# Patient Record
Sex: Male | Born: 1965 | Race: White | Hispanic: No | Marital: Single | Smoking: Never smoker
Health system: Southern US, Community
[De-identification: ages and names within clinical notes are randomized; demographics above are authoritative.]

## PROBLEM LIST (undated history)

## (undated) DIAGNOSIS — Z789 Other specified health status: Secondary | ICD-10-CM

## (undated) DIAGNOSIS — F1010 Alcohol abuse, uncomplicated: Secondary | ICD-10-CM

## (undated) HISTORY — PX: COSMETIC SURGERY: SHX468

---

## 2013-03-07 ENCOUNTER — Ambulatory Visit: Payer: Self-pay | Admitting: Family Medicine

## 2013-03-07 VITALS — BP 125/78 | HR 77 | Temp 97.9°F | Resp 18 | Ht 69.0 in | Wt 173.0 lb

## 2013-03-07 DIAGNOSIS — L259 Unspecified contact dermatitis, unspecified cause: Secondary | ICD-10-CM

## 2013-03-07 MED ORDER — TRIAMCINOLONE ACETONIDE 0.1 % EX CREA: TOPICAL_CREAM | Freq: Two times a day (BID) | CUTANEOUS | Status: DC

## 2013-03-07 MED ORDER — PREDNISONE 20 MG PO TABS: ORAL_TABLET | ORAL | Status: DC

## 2013-03-07 NOTE — Patient Instructions (Signed)
Poison [employer] ivy is a inflammation of the skin (contact dermatitis) caused by touching the allergens on the leaves of the ivy plant following previous exposure to the plant. The rash usually appears 48 hours after exposure. The rash is usually bumps (papules) or blisters (vesicles) in a linear pattern. Depending on your own sensitivity, the rash may simply cause redness and itching, or it may also progress to blisters which may break open. These must be well cared for to prevent secondary bacterial (germ) infection, followed by scarring. Keep any open areas dry, clean, dressed, and covered with an antibacterial ointment if needed. The eyes may also get puffy. The puffiness is worst in the morning and gets better as the day progresses. This dermatitis usually heals without scarring, within 2 to 3 weeks without treatment. HOME CARE INSTRUCTIONS  Thoroughly wash with soap and water as soon as you have been exposed to poison ivy. You have about one half hour to remove the plant resin before it will cause the rash. This washing will destroy the oil or antigen on the skin that is causing, or will cause, the rash. Be sure to wash under your fingernails as any plant resin there will continue to spread the rash. Do not rub skin vigorously when washing affected area. Poison ivy cannot spread if no oil from the plant remains on your body. A rash that has progressed to weeping sores will not spread the rash unless you have not washed thoroughly. It is also important to wash any clothes you have been wearing as these may carry active allergens. The rash will return if you wear the unwashed clothing, even several days later. Avoidance of the plant in the future is the best measure. Poison ivy plant can be recognized by the number of leaves. Generally, poison ivy has three leaves with flowering branches on a single stem. Diphenhydramine may be purchased over the counter and used as needed for itching. Do not drive with  this medication if it makes you drowsy.Ask your caregiver about medication for children. SEEK MEDICAL CARE IF:  Open sores develop.  Redness spreads beyond area of rash.  You notice purulent (pus-like) discharge.  You have increased pain.  Other signs of infection develop (such as fever). Document Released: 08/31/2000 Document Revised: 11/26/2011 Document Reviewed: 07/20/2009 [facility] Patient Information 2014 [place], [place].      Shingles Shingles (herpes zoster) is an infection that is caused by the same virus that causes chickenpox (varicella). The infection causes a painful skin rash and fluid-filled blisters, which eventually break open, crust over, and heal. It may occur in any area of the body, but it usually affects only one side of the body or face. The pain of shingles usually lasts about 1 month. However, some people with shingles may develop long-term (chronic) pain in the affected area of the body. Shingles often occurs many years after the person had chickenpox. It is more common:  In people older than 50 years.  In people with weakened immune systems, such as those with HIV, AIDS, or cancer.  In people taking medicines that weaken the immune system, such as transplant medicines.  In people under great stress. CAUSES  Shingles is caused by the varicella zoster virus (VZV), which also causes chickenpox. After a person is infected with the virus, it can remain in the person's body for years in an inactive state (dormant). To cause shingles, the virus reactivates and breaks out as an infection in a nerve root. The  virus can be spread from person to person (contagious) through contact with open blisters of the shingles rash. It will only spread to people who have not had chickenpox. When these people are exposed to the virus, they may develop chickenpox. They will not develop shingles. Once the blisters scab over, the person is no longer contagious and cannot spread the  virus to others. SYMPTOMS  Shingles shows up in stages. The initial symptoms may be pain, itching, and tingling in an area of the skin. This pain is usually described as burning, stabbing, or throbbing.In a few days or weeks, a painful red rash will appear in the area where the pain, itching, and tingling were felt. The rash is usually on one side of the body in a band or belt-like pattern. Then, the rash usually turns into fluid-filled blisters. They will scab over and dry up in approximately 2 3 weeks. Flu-like symptoms may also occur with the initial symptoms, the rash, or the blisters. These may include:  Fever.  Chills.  Headache.  Upset stomach. DIAGNOSIS  Your caregiver will perform a skin exam to diagnose shingles. Skin scrapings or fluid samples may also be taken from the blisters. This sample will be examined under a microscope or sent to a lab for further testing. TREATMENT  There is no specific cure for shingles. Your caregiver will likely prescribe medicines to help you manage the pain, recover faster, and avoid long-term problems. This may include antiviral drugs, anti-inflammatory drugs, and pain medicines. HOME CARE INSTRUCTIONS   Take a cool bath or apply cool compresses to the area of the rash or blisters as directed. This may help with the pain and itching.   Only take over-the-counter or prescription medicines as directed by your caregiver.   Rest as directed by your caregiver.  Keep your rash and blisters clean with mild soap and cool water or as directed by your caregiver.  Do not pick your blisters or scratch your rash. Apply an anti-itch cream or numbing creams to the affected area as directed by your caregiver.  Keep your shingles rash covered with a loose bandage (dressing).  Avoid skin contact with:  Babies.   Pregnant women.   Children with eczema.   Elderly people with transplants.   People with chronic illnesses, such as leukemia or AIDS.    Wear loose-fitting clothing to help ease the pain of material rubbing against the rash.  Keep all follow-up appointments with your caregiver.If the area involved is on your face, you may receive a referral for follow-up to a specialist, such as an eye doctor (ophthalmologist) or an ear, nose, and throat (ENT) doctor. Keeping all follow-up appointments will help you avoid eye complications, chronic pain, or disability.  SEEK IMMEDIATE MEDICAL CARE IF:   You have facial pain, pain around the eye area, or loss of feeling on one side of your face.  You have ear pain or ringing in your ear.  You have loss of taste.  Your pain is not relieved with prescribed medicines.   Your redness or swelling spreads.   You have more pain and swelling.  Your condition is worsening or has changed.   You have a feveror persistent symptoms for more than 2 3 days.  You have a fever and your symptoms suddenly get worse. MAKE SURE YOU:  Understand these instructions.  Will watch your condition.  Will get help right away if you are not doing well or get worse. Document Released: 09/03/2005 Document  Revised: 05/28/2012 Document Reviewed: 04/17/2012 [facility] Patient Information 2014 [place], [place].   Take prednisone 3 daily for 3 days, then 2 daily for 3 days, then 1 daily for 3 days   Use cream twice daily on rash
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
## 2019-10-05 ENCOUNTER — Other Ambulatory Visit: Payer: Self-pay

## 2021-03-25 ENCOUNTER — Emergency Department (HOSPITAL_COMMUNITY): Admission: EM | Admit: 2021-03-25 | Payer: Self-pay | Source: Home / Self Care

## 2021-03-25 ENCOUNTER — Other Ambulatory Visit: Payer: Self-pay

## 2021-03-25 ENCOUNTER — Emergency Department (HOSPITAL_COMMUNITY): Payer: Self-pay

## 2021-03-25 ENCOUNTER — Encounter (HOSPITAL_COMMUNITY): Payer: Self-pay | Admitting: *Deleted

## 2021-03-25 ENCOUNTER — Emergency Department (HOSPITAL_COMMUNITY)
Admission: EM | Admit: 2021-03-25 | Discharge: 2021-03-25 | Disposition: A | Discharge status: Home / Self Care | Payer: Self-pay | Attending: Emergency Medicine | Admitting: Emergency Medicine

## 2021-03-25 DIAGNOSIS — S0121XA Laceration without foreign body of nose, initial encounter: Secondary | ICD-10-CM | POA: Insufficient documentation

## 2021-03-25 DIAGNOSIS — Z23 Encounter for immunization: Secondary | ICD-10-CM | POA: Insufficient documentation

## 2021-03-25 DIAGNOSIS — H02846 Edema of left eye, unspecified eyelid: Secondary | ICD-10-CM | POA: Insufficient documentation

## 2021-03-25 DIAGNOSIS — S80211A Abrasion, right knee, initial encounter: Secondary | ICD-10-CM | POA: Insufficient documentation

## 2021-03-25 DIAGNOSIS — S0081XA Abrasion of other part of head, initial encounter: Secondary | ICD-10-CM

## 2021-03-25 DIAGNOSIS — S80219A Abrasion, unspecified knee, initial encounter: Secondary | ICD-10-CM

## 2021-03-25 DIAGNOSIS — S30811A Abrasion of abdominal wall, initial encounter: Secondary | ICD-10-CM | POA: Insufficient documentation

## 2021-03-25 DIAGNOSIS — S80212A Abrasion, left knee, initial encounter: Secondary | ICD-10-CM | POA: Insufficient documentation

## 2021-03-25 DIAGNOSIS — F10920 Alcohol use, unspecified with intoxication, uncomplicated: Secondary | ICD-10-CM

## 2021-03-25 DIAGNOSIS — F10120 Alcohol abuse with intoxication, uncomplicated: Secondary | ICD-10-CM | POA: Insufficient documentation

## 2021-03-25 DIAGNOSIS — R519 Headache, unspecified: Secondary | ICD-10-CM | POA: Insufficient documentation

## 2021-03-25 DIAGNOSIS — Y908 Blood alcohol level of 240 mg/100 ml or more: Secondary | ICD-10-CM | POA: Insufficient documentation

## 2021-03-25 LAB — BASIC METABOLIC PANEL
Anion gap: 11 (ref 5–15)
BUN: 12 mg/dL (ref 6–20)
CO2: 20 mmol/L — ABNORMAL LOW (ref 22–32)
Calcium: 8.9 mg/dL (ref 8.9–10.3)
Chloride: 111 mmol/L (ref 98–111)
Creatinine, Ser: 0.87 mg/dL (ref 0.61–1.24)
GFR, Estimated: >60 mL/min (ref >60)
Glucose, Bld: 108 mg/dL — ABNORMAL HIGH (ref 70–99)
Potassium: 4.2 mmol/L (ref 3.5–5.1)
Sodium: 142 mmol/L (ref 135–145)

## 2021-03-25 LAB — CBC WITH DIFFERENTIAL/PLATELET
Abs Immature Granulocytes: 0.03 10*3/uL (ref 0.00–0.07)
Basophils Absolute: 0.1 10*3/uL (ref 0.0–0.1)
Basophils Relative: 1 %
Eosinophils Absolute: 0.1 10*3/uL (ref 0.0–0.5)
Eosinophils Relative: 2 %
HCT: 42.5 % (ref 39.0–52.0)
Hemoglobin: 14.7 g/dL (ref 13.0–17.0)
Immature Granulocytes: 1 %
Lymphocytes Relative: 42 %
Lymphs Abs: 1.8 10*3/uL (ref 0.7–4.0)
MCH: 32.7 pg (ref 26.0–34.0)
MCHC: 34.6 g/dL (ref 30.0–36.0)
MCV: 94.4 fL (ref 80.0–100.0)
Monocytes Absolute: 0.5 10*3/uL (ref 0.1–1.0)
Monocytes Relative: 12 %
Neutro Abs: 1.8 10*3/uL (ref 1.7–7.7)
Neutrophils Relative %: 42 %
Platelets: 186 10*3/uL (ref 150–400)
RBC: 4.5 MIL/uL (ref 4.22–5.81)
RDW: 13.7 % (ref 11.5–15.5)
WBC: 4.3 10*3/uL (ref 4.0–10.5)
nRBC: 0 % (ref 0.0–0.2)

## 2021-03-25 LAB — ETHANOL: Alcohol, Ethyl (B): 287 mg/dL — ABNORMAL HIGH (ref <10)

## 2021-03-25 MED ORDER — TETANUS-DIPHTH-ACELL PERTUSSIS 5-2.5-18.5 LF-MCG/0.5 IM SUSY: 0.5000 mL | PREFILLED_SYRINGE | Freq: Once | INTRAMUSCULAR | Status: DC

## 2021-03-25 MED ORDER — TETANUS-DIPHTH-ACELL PERTUSSIS 5-2.5-18.5 LF-MCG/0.5 IM SUSY
0.5000 mL | PREFILLED_SYRINGE | Freq: Once | INTRAMUSCULAR | Status: AC
  Administered 2021-03-25: 0.5 mL via INTRAMUSCULAR

## 2021-03-25 MED ORDER — LIDOCAINE-EPINEPHRINE (PF) 2 %-1:200000 IJ SOLN
10.0000 mL | Freq: Once | INTRAMUSCULAR | Status: DC
  Filled 2021-03-25: qty 20
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
## 2021-03-25 NOTE — ED Notes (Signed)
Pt ambulated in hallway w/ NT. Steady on feet, denies dizziness or lightheadedness

## 2021-03-25 NOTE — ED Notes (Signed)
RN reviewed discharge instructions w/ pt. Follow up and wound care reviewed, pt had no further questions 

## 2021-03-25 NOTE — ED Notes (Signed)
Patient did not want any family called.

## 2021-03-25 NOTE — ED Notes (Signed)
Pt transported to CT w/ RN 

## 2021-03-25 NOTE — Discharge Instructions (Addendum)
Follow up with your family doctor.  Return for worsening pain, confusion, redness, drainage, fever
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
## 2021-08-25 ENCOUNTER — Inpatient Hospital Stay (HOSPITAL_COMMUNITY)
Admission: EM | Admit: 2021-08-25 | Discharge: 2021-09-07 | DRG: 957 | Disposition: A | Discharge status: Rehabilitation Facility | Payer: Medicaid Other | Attending: Surgery | Admitting: Surgery

## 2021-08-25 DIAGNOSIS — E8809 Other disorders of plasma-protein metabolism, not elsewhere classified: Secondary | ICD-10-CM | POA: Diagnosis present

## 2021-08-25 DIAGNOSIS — R402242 Coma scale, best verbal response, confused conversation, at arrival to emergency department: Secondary | ICD-10-CM | POA: Diagnosis present

## 2021-08-25 DIAGNOSIS — S42101A Fracture of unspecified part of scapula, right shoulder, initial encounter for closed fracture: Secondary | ICD-10-CM

## 2021-08-25 DIAGNOSIS — S83521A Sprain of posterior cruciate ligament of right knee, initial encounter: Secondary | ICD-10-CM | POA: Diagnosis present

## 2021-08-25 DIAGNOSIS — S2220XA Unspecified fracture of sternum, initial encounter for closed fracture: Secondary | ICD-10-CM | POA: Diagnosis present

## 2021-08-25 DIAGNOSIS — S83104A Unspecified dislocation of right knee, initial encounter: Secondary | ICD-10-CM | POA: Diagnosis present

## 2021-08-25 DIAGNOSIS — S83282A Other tear of lateral meniscus, current injury, left knee, initial encounter: Secondary | ICD-10-CM | POA: Diagnosis present

## 2021-08-25 DIAGNOSIS — F10129 Alcohol abuse with intoxication, unspecified: Secondary | ICD-10-CM | POA: Diagnosis present

## 2021-08-25 DIAGNOSIS — S329XXA Fracture of unspecified parts of lumbosacral spine and pelvis, initial encounter for closed fracture: Secondary | ICD-10-CM | POA: Diagnosis present

## 2021-08-25 DIAGNOSIS — S32811A Multiple fractures of pelvis with unstable disruption of pelvic ring, initial encounter for closed fracture: Secondary | ICD-10-CM | POA: Diagnosis present

## 2021-08-25 DIAGNOSIS — S36113A Laceration of liver, unspecified degree, initial encounter: Secondary | ICD-10-CM | POA: Diagnosis present

## 2021-08-25 DIAGNOSIS — I609 Nontraumatic subarachnoid hemorrhage, unspecified: Secondary | ICD-10-CM

## 2021-08-25 DIAGNOSIS — T148XXA Other injury of unspecified body region, initial encounter: Secondary | ICD-10-CM

## 2021-08-25 DIAGNOSIS — S83104D Unspecified dislocation of right knee, subsequent encounter: Secondary | ICD-10-CM

## 2021-08-25 DIAGNOSIS — S42111A Displaced fracture of body of scapula, right shoulder, initial encounter for closed fracture: Secondary | ICD-10-CM | POA: Diagnosis present

## 2021-08-25 DIAGNOSIS — S3210XA Unspecified fracture of sacrum, initial encounter for closed fracture: Secondary | ICD-10-CM | POA: Diagnosis present

## 2021-08-25 DIAGNOSIS — R402142 Coma scale, eyes open, spontaneous, at arrival to emergency department: Secondary | ICD-10-CM | POA: Diagnosis present

## 2021-08-25 DIAGNOSIS — Z20822 Contact with and (suspected) exposure to covid-19: Secondary | ICD-10-CM | POA: Diagnosis present

## 2021-08-25 DIAGNOSIS — N501 Vascular disorders of male genital organs: Secondary | ICD-10-CM

## 2021-08-25 DIAGNOSIS — S2231XA Fracture of one rib, right side, initial encounter for closed fracture: Secondary | ICD-10-CM | POA: Diagnosis present

## 2021-08-25 DIAGNOSIS — Z23 Encounter for immunization: Secondary | ICD-10-CM

## 2021-08-25 DIAGNOSIS — S32029A Unspecified fracture of second lumbar vertebra, initial encounter for closed fracture: Secondary | ICD-10-CM | POA: Diagnosis present

## 2021-08-25 DIAGNOSIS — S32019A Unspecified fracture of first lumbar vertebra, initial encounter for closed fracture: Secondary | ICD-10-CM | POA: Diagnosis present

## 2021-08-25 DIAGNOSIS — M5127 Other intervertebral disc displacement, lumbosacral region: Secondary | ICD-10-CM | POA: Diagnosis present

## 2021-08-25 DIAGNOSIS — S32059A Unspecified fracture of fifth lumbar vertebra, initial encounter for closed fracture: Secondary | ICD-10-CM | POA: Diagnosis present

## 2021-08-25 DIAGNOSIS — S83511A Sprain of anterior cruciate ligament of right knee, initial encounter: Secondary | ICD-10-CM | POA: Diagnosis present

## 2021-08-25 DIAGNOSIS — Z419 Encounter for procedure for purposes other than remedying health state, unspecified: Secondary | ICD-10-CM

## 2021-08-25 DIAGNOSIS — Y908 Blood alcohol level of 240 mg/100 ml or more: Secondary | ICD-10-CM | POA: Diagnosis present

## 2021-08-25 DIAGNOSIS — M5126 Other intervertebral disc displacement, lumbar region: Secondary | ICD-10-CM | POA: Diagnosis present

## 2021-08-25 DIAGNOSIS — R578 Other shock: Secondary | ICD-10-CM | POA: Diagnosis present

## 2021-08-25 DIAGNOSIS — M898X9 Other specified disorders of bone, unspecified site: Secondary | ICD-10-CM | POA: Diagnosis present

## 2021-08-25 DIAGNOSIS — S52501A Unspecified fracture of the lower end of right radius, initial encounter for closed fracture: Secondary | ICD-10-CM

## 2021-08-25 DIAGNOSIS — S52601A Unspecified fracture of lower end of right ulna, initial encounter for closed fracture: Secondary | ICD-10-CM

## 2021-08-25 DIAGNOSIS — H5702 Anisocoria: Secondary | ICD-10-CM | POA: Diagnosis present

## 2021-08-25 DIAGNOSIS — S37031A Laceration of right kidney, unspecified degree, initial encounter: Secondary | ICD-10-CM | POA: Diagnosis present

## 2021-08-25 DIAGNOSIS — D696 Thrombocytopenia, unspecified: Secondary | ICD-10-CM | POA: Diagnosis not present

## 2021-08-25 DIAGNOSIS — D62 Acute posthemorrhagic anemia: Secondary | ICD-10-CM | POA: Diagnosis not present

## 2021-08-25 DIAGNOSIS — S42001A Fracture of unspecified part of right clavicle, initial encounter for closed fracture: Secondary | ICD-10-CM

## 2021-08-25 DIAGNOSIS — K5900 Constipation, unspecified: Secondary | ICD-10-CM | POA: Diagnosis not present

## 2021-08-25 DIAGNOSIS — E559 Vitamin D deficiency, unspecified: Secondary | ICD-10-CM | POA: Diagnosis present

## 2021-08-25 DIAGNOSIS — S52371A Galeazzi's fracture of right radius, initial encounter for closed fracture: Secondary | ICD-10-CM | POA: Diagnosis present

## 2021-08-25 DIAGNOSIS — R402362 Coma scale, best motor response, obeys commands, at arrival to emergency department: Secondary | ICD-10-CM | POA: Diagnosis present

## 2021-08-25 DIAGNOSIS — S52611A Displaced fracture of right ulna styloid process, initial encounter for closed fracture: Secondary | ICD-10-CM | POA: Diagnosis present

## 2021-08-25 DIAGNOSIS — S83421A Sprain of lateral collateral ligament of right knee, initial encounter: Secondary | ICD-10-CM | POA: Diagnosis present

## 2021-08-25 DIAGNOSIS — S066X1A Traumatic subarachnoid hemorrhage with loss of consciousness of 30 minutes or less, initial encounter: Principal | ICD-10-CM | POA: Diagnosis present

## 2021-08-25 DIAGNOSIS — S32049A Unspecified fracture of fourth lumbar vertebra, initial encounter for closed fracture: Secondary | ICD-10-CM | POA: Diagnosis present

## 2021-08-25 DIAGNOSIS — T1490XA Injury, unspecified, initial encounter: Secondary | ICD-10-CM

## 2021-08-25 DIAGNOSIS — S52021A Displaced fracture of olecranon process without intraarticular extension of right ulna, initial encounter for closed fracture: Secondary | ICD-10-CM | POA: Diagnosis present

## 2021-08-25 DIAGNOSIS — R739 Hyperglycemia, unspecified: Secondary | ICD-10-CM | POA: Diagnosis not present

## 2021-08-25 DIAGNOSIS — S32009A Unspecified fracture of unspecified lumbar vertebra, initial encounter for closed fracture: Secondary | ICD-10-CM

## 2021-08-25 HISTORY — DX: Alcohol abuse, uncomplicated: F10.10

## 2021-08-25 LAB — I-STAT CHEM 8, ED
BUN: 18 mg/dL (ref 6–20)
Calcium, Ion: 1.05 mmol/L — ABNORMAL LOW (ref 1.15–1.40)
Chloride: 105 mmol/L (ref 98–111)
Creatinine, Ser: 1.9 mg/dL — ABNORMAL HIGH (ref 0.61–1.24)
Glucose, Bld: 152 mg/dL — ABNORMAL HIGH (ref 70–99)
HCT: 44 % (ref 39.0–52.0)
Hemoglobin: 15 g/dL (ref 13.0–17.0)
Potassium: 4.2 mmol/L (ref 3.5–5.1)
Sodium: 139 mmol/L (ref 135–145)
TCO2: 23 mmol/L (ref 22–32)
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
## 2021-08-26 ENCOUNTER — Emergency Department (HOSPITAL_COMMUNITY): Payer: Medicaid Other

## 2021-08-26 ENCOUNTER — Inpatient Hospital Stay (HOSPITAL_COMMUNITY): Payer: Medicaid Other

## 2021-08-26 DIAGNOSIS — S42111A Displaced fracture of body of scapula, right shoulder, initial encounter for closed fracture: Secondary | ICD-10-CM | POA: Diagnosis present

## 2021-08-26 DIAGNOSIS — S52371A Galeazzi's fracture of right radius, initial encounter for closed fracture: Secondary | ICD-10-CM | POA: Diagnosis present

## 2021-08-26 DIAGNOSIS — S32512A Fracture of superior rim of left pubis, initial encounter for closed fracture: Secondary | ICD-10-CM

## 2021-08-26 DIAGNOSIS — S42021A Displaced fracture of shaft of right clavicle, initial encounter for closed fracture: Secondary | ICD-10-CM

## 2021-08-26 DIAGNOSIS — S32511A Fracture of superior rim of right pubis, initial encounter for closed fracture: Secondary | ICD-10-CM

## 2021-08-26 DIAGNOSIS — Z20822 Contact with and (suspected) exposure to covid-19: Secondary | ICD-10-CM | POA: Diagnosis present

## 2021-08-26 DIAGNOSIS — S32029A Unspecified fracture of second lumbar vertebra, initial encounter for closed fracture: Secondary | ICD-10-CM | POA: Diagnosis present

## 2021-08-26 DIAGNOSIS — S069X1D Unspecified intracranial injury with loss of consciousness of 30 minutes or less, subsequent encounter: Secondary | ICD-10-CM | POA: Diagnosis not present

## 2021-08-26 DIAGNOSIS — S83104D Unspecified dislocation of right knee, subsequent encounter: Secondary | ICD-10-CM | POA: Diagnosis not present

## 2021-08-26 DIAGNOSIS — S83282A Other tear of lateral meniscus, current injury, left knee, initial encounter: Secondary | ICD-10-CM | POA: Diagnosis present

## 2021-08-26 DIAGNOSIS — S2220XA Unspecified fracture of sternum, initial encounter for closed fracture: Secondary | ICD-10-CM | POA: Diagnosis present

## 2021-08-26 DIAGNOSIS — T1490XA Injury, unspecified, initial encounter: Secondary | ICD-10-CM | POA: Diagnosis present

## 2021-08-26 DIAGNOSIS — R7989 Other specified abnormal findings of blood chemistry: Secondary | ICD-10-CM | POA: Diagnosis not present

## 2021-08-26 DIAGNOSIS — S83422D Sprain of lateral collateral ligament of left knee, subsequent encounter: Secondary | ICD-10-CM | POA: Diagnosis not present

## 2021-08-26 DIAGNOSIS — D62 Acute posthemorrhagic anemia: Secondary | ICD-10-CM | POA: Diagnosis not present

## 2021-08-26 DIAGNOSIS — Y908 Blood alcohol level of 240 mg/100 ml or more: Secondary | ICD-10-CM | POA: Diagnosis present

## 2021-08-26 DIAGNOSIS — S8992XD Unspecified injury of left lower leg, subsequent encounter: Secondary | ICD-10-CM | POA: Diagnosis not present

## 2021-08-26 DIAGNOSIS — S2231XA Fracture of one rib, right side, initial encounter for closed fracture: Secondary | ICD-10-CM | POA: Diagnosis present

## 2021-08-26 DIAGNOSIS — S062X1S Diffuse traumatic brain injury with loss of consciousness of 30 minutes or less, sequela: Secondary | ICD-10-CM | POA: Diagnosis not present

## 2021-08-26 DIAGNOSIS — S37031A Laceration of right kidney, unspecified degree, initial encounter: Secondary | ICD-10-CM | POA: Diagnosis present

## 2021-08-26 DIAGNOSIS — S32019A Unspecified fracture of first lumbar vertebra, initial encounter for closed fracture: Secondary | ICD-10-CM | POA: Diagnosis present

## 2021-08-26 DIAGNOSIS — T07XXXA Unspecified multiple injuries, initial encounter: Secondary | ICD-10-CM | POA: Diagnosis not present

## 2021-08-26 DIAGNOSIS — S83272D Complex tear of lateral meniscus, current injury, left knee, subsequent encounter: Secondary | ICD-10-CM | POA: Diagnosis not present

## 2021-08-26 DIAGNOSIS — S329XXA Fracture of unspecified parts of lumbosacral spine and pelvis, initial encounter for closed fracture: Secondary | ICD-10-CM | POA: Diagnosis present

## 2021-08-26 DIAGNOSIS — S3210XA Unspecified fracture of sacrum, initial encounter for closed fracture: Secondary | ICD-10-CM | POA: Diagnosis present

## 2021-08-26 DIAGNOSIS — S83511D Sprain of anterior cruciate ligament of right knee, subsequent encounter: Secondary | ICD-10-CM | POA: Diagnosis not present

## 2021-08-26 DIAGNOSIS — S32810S Multiple fractures of pelvis with stable disruption of pelvic ring, sequela: Secondary | ICD-10-CM | POA: Diagnosis not present

## 2021-08-26 DIAGNOSIS — S83104A Unspecified dislocation of right knee, initial encounter: Secondary | ICD-10-CM | POA: Diagnosis present

## 2021-08-26 DIAGNOSIS — S32049A Unspecified fracture of fourth lumbar vertebra, initial encounter for closed fracture: Secondary | ICD-10-CM | POA: Diagnosis present

## 2021-08-26 DIAGNOSIS — Z23 Encounter for immunization: Secondary | ICD-10-CM | POA: Diagnosis not present

## 2021-08-26 DIAGNOSIS — E8809 Other disorders of plasma-protein metabolism, not elsewhere classified: Secondary | ICD-10-CM | POA: Diagnosis present

## 2021-08-26 DIAGNOSIS — S52611A Displaced fracture of right ulna styloid process, initial encounter for closed fracture: Secondary | ICD-10-CM | POA: Diagnosis present

## 2021-08-26 DIAGNOSIS — S066X1A Traumatic subarachnoid hemorrhage with loss of consciousness of 30 minutes or less, initial encounter: Secondary | ICD-10-CM | POA: Diagnosis not present

## 2021-08-26 DIAGNOSIS — S32811A Multiple fractures of pelvis with unstable disruption of pelvic ring, initial encounter for closed fracture: Secondary | ICD-10-CM | POA: Diagnosis present

## 2021-08-26 DIAGNOSIS — D696 Thrombocytopenia, unspecified: Secondary | ICD-10-CM | POA: Diagnosis not present

## 2021-08-26 DIAGNOSIS — R578 Other shock: Secondary | ICD-10-CM | POA: Diagnosis present

## 2021-08-26 DIAGNOSIS — S32059A Unspecified fracture of fifth lumbar vertebra, initial encounter for closed fracture: Secondary | ICD-10-CM | POA: Diagnosis present

## 2021-08-26 DIAGNOSIS — S32301A Unspecified fracture of right ilium, initial encounter for closed fracture: Secondary | ICD-10-CM | POA: Diagnosis not present

## 2021-08-26 DIAGNOSIS — S83521D Sprain of posterior cruciate ligament of right knee, subsequent encounter: Secondary | ICD-10-CM | POA: Diagnosis not present

## 2021-08-26 DIAGNOSIS — S069X2S Unspecified intracranial injury with loss of consciousness of 31 minutes to 59 minutes, sequela: Secondary | ICD-10-CM | POA: Diagnosis not present

## 2021-08-26 DIAGNOSIS — F10129 Alcohol abuse with intoxication, unspecified: Secondary | ICD-10-CM | POA: Diagnosis present

## 2021-08-26 DIAGNOSIS — S36113A Laceration of liver, unspecified degree, initial encounter: Secondary | ICD-10-CM | POA: Diagnosis present

## 2021-08-26 DIAGNOSIS — Z969 Presence of functional implant, unspecified: Secondary | ICD-10-CM | POA: Diagnosis not present

## 2021-08-26 DIAGNOSIS — S52021A Displaced fracture of olecranon process without intraarticular extension of right ulna, initial encounter for closed fracture: Secondary | ICD-10-CM | POA: Diagnosis present

## 2021-08-26 DIAGNOSIS — S065XAD Traumatic subdural hemorrhage with loss of consciousness status unknown, subsequent encounter: Secondary | ICD-10-CM | POA: Diagnosis not present

## 2021-08-26 LAB — COMPREHENSIVE METABOLIC PANEL
ALT: 126 U/L — ABNORMAL HIGH (ref 0–44)
AST: 179 U/L — ABNORMAL HIGH (ref 15–41)
Albumin: 3.3 g/dL — ABNORMAL LOW (ref 3.5–5.0)
Alkaline Phosphatase: 91 U/L (ref 38–126)
Anion gap: 11 (ref 5–15)
BUN: 15 mg/dL (ref 6–20)
CO2: 21 mmol/L — ABNORMAL LOW (ref 22–32)
Calcium: 8.4 mg/dL — ABNORMAL LOW (ref 8.9–10.3)
Chloride: 106 mmol/L (ref 98–111)
Creatinine, Ser: 1.39 mg/dL — ABNORMAL HIGH (ref 0.61–1.24)
GFR, Estimated: 59 mL/min — ABNORMAL LOW (ref >60)
Glucose, Bld: 154 mg/dL — ABNORMAL HIGH (ref 70–99)
Potassium: 4.3 mmol/L (ref 3.5–5.1)
Sodium: 138 mmol/L (ref 135–145)
Total Bilirubin: 0.5 mg/dL (ref 0.3–1.2)
Total Protein: 6.1 g/dL — ABNORMAL LOW (ref 6.5–8.1)

## 2021-08-26 LAB — CBC
HCT: 44.4 % (ref 39.0–52.0)
Hemoglobin: 14.4 g/dL (ref 13.0–17.0)
MCH: 31.7 pg (ref 26.0–34.0)
MCHC: 32.4 g/dL (ref 30.0–36.0)
MCV: 97.8 fL (ref 80.0–100.0)
Platelets: 273 10*3/uL (ref 150–400)
RBC: 4.54 MIL/uL (ref 4.22–5.81)
RDW: 13.2 % (ref 11.5–15.5)
WBC: 7.8 10*3/uL (ref 4.0–10.5)
nRBC: 0 % (ref 0.0–0.2)

## 2021-08-26 LAB — PROTIME-INR
INR: 0.9 (ref 0.8–1.2)
Prothrombin Time: 12.5 seconds (ref 11.4–15.2)

## 2021-08-26 LAB — URINALYSIS, ROUTINE W REFLEX MICROSCOPIC
Bacteria, UA: NONE SEEN
Bilirubin Urine: NEGATIVE
Glucose, UA: NEGATIVE mg/dL
Ketones, ur: NEGATIVE mg/dL
Leukocytes,Ua: NEGATIVE
Nitrite: NEGATIVE
Protein, ur: 30 mg/dL — AB
RBC / HPF: >50 RBC/hpf — ABNORMAL HIGH (ref 0–5)
Specific Gravity, Urine: 1.036 — ABNORMAL HIGH (ref 1.005–1.030)
pH: 5 (ref 5.0–8.0)

## 2021-08-26 LAB — MRSA NEXT GEN BY PCR, NASAL: MRSA by PCR Next Gen: NOT DETECTED

## 2021-08-26 LAB — SAMPLE TO BLOOD BANK

## 2021-08-26 LAB — RESP PANEL BY RT-PCR (FLU A&B, COVID) ARPGX2
Influenza A by PCR: NEGATIVE
Influenza B by PCR: NEGATIVE
SARS Coronavirus 2 by RT PCR: NEGATIVE

## 2021-08-26 LAB — HIV ANTIBODY (ROUTINE TESTING W REFLEX): HIV Screen 4th Generation wRfx: NONREACTIVE

## 2021-08-26 LAB — LACTIC ACID, PLASMA: Lactic Acid, Venous: 4.7 mmol/L (ref 0.5–1.9)

## 2021-08-26 LAB — ABO/RH: ABO/RH(D): O POS

## 2021-08-26 LAB — ETHANOL: Alcohol, Ethyl (B): 299 mg/dL — ABNORMAL HIGH (ref <10)

## 2021-08-26 LAB — BLOOD PRODUCT ORDER (VERBAL) VERIFICATION
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
## 2021-08-26 MED ORDER — THIAMINE HCL 100 MG PO TABS
100.0000 mg | ORAL_TABLET | Freq: Every day | ORAL | Status: DC
  Administered 2021-08-26 – 2021-09-07 (×11): 100 mg via ORAL
  Filled 2021-08-26 (×12): qty 1

## 2021-08-26 MED ORDER — SODIUM CHLORIDE 0.9 % IV BOLUS
1000.0000 mL | Freq: Once | INTRAVENOUS | Status: AC
  Administered 2021-08-26: 1000 mL via INTRAVENOUS

## 2021-08-26 MED ORDER — LORAZEPAM 2 MG/ML IJ SOLN
0.0000 mg | Freq: Four times a day (QID) | INTRAMUSCULAR | Status: AC
Start: 2021-08-26 — End: 2021-08-28

## 2021-08-26 MED ORDER — ACETAMINOPHEN 325 MG PO TABS
650.0000 mg | ORAL_TABLET | Freq: Four times a day (QID) | ORAL | Status: DC
  Administered 2021-08-26 – 2021-09-07 (×35): 650 mg via ORAL
  Filled 2021-08-26 (×36): qty 2

## 2021-08-26 MED ORDER — TETANUS-DIPHTH-ACELL PERTUSSIS 5-2.5-18.5 LF-MCG/0.5 IM SUSY
0.5000 mL | PREFILLED_SYRINGE | Freq: Once | INTRAMUSCULAR | Status: AC
  Administered 2021-08-26: 0.5 mL via INTRAMUSCULAR
  Filled 2021-08-26: qty 0.5

## 2021-08-26 MED ORDER — SIMETHICONE 80 MG PO CHEW: 80.0000 mg | CHEWABLE_TABLET | Freq: Four times a day (QID) | ORAL | Status: DC | PRN

## 2021-08-26 MED ORDER — IOHEXOL 350 MG/ML SOLN
90.0000 mL | Freq: Once | INTRAVENOUS | Status: AC | PRN
  Administered 2021-08-26: 90 mL via INTRAVENOUS

## 2021-08-26 MED ORDER — CHLORHEXIDINE GLUCONATE 0.12 % MT SOLN
15.0000 mL | Freq: Two times a day (BID) | OROMUCOSAL | Status: DC
  Administered 2021-08-26 – 2021-09-06 (×21): 15 mL via OROMUCOSAL
  Filled 2021-08-26 (×18): qty 15

## 2021-08-26 MED ORDER — LACTATED RINGERS IV BOLUS
1000.0000 mL | Freq: Once | INTRAVENOUS | Status: AC
  Administered 2021-08-26: 1000 mL via INTRAVENOUS

## 2021-08-26 MED ORDER — LACTATED RINGERS IV SOLN: INTRAVENOUS | Status: DC

## 2021-08-26 MED ORDER — HYDROMORPHONE HCL 1 MG/ML IJ SOLN
INTRAMUSCULAR | Status: AC
  Filled 2021-08-26: qty 1

## 2021-08-26 MED ORDER — MIDAZOLAM HCL 2 MG/2ML IJ SOLN: 1.0000 mg | INTRAMUSCULAR | Status: DC | PRN

## 2021-08-26 MED ORDER — OXYCODONE HCL 5 MG PO TABS
5.0000 mg | ORAL_TABLET | ORAL | Status: DC | PRN
  Administered 2021-08-27 – 2021-09-01 (×3): 5 mg via ORAL
  Filled 2021-08-26 (×3): qty 1

## 2021-08-26 MED ORDER — LORAZEPAM 1 MG PO TABS: 1.0000 mg | ORAL_TABLET | ORAL | Status: AC | PRN

## 2021-08-26 MED ORDER — FOLIC ACID 1 MG PO TABS
1.0000 mg | ORAL_TABLET | Freq: Every day | ORAL | Status: DC
  Administered 2021-08-26 – 2021-09-07 (×11): 1 mg via ORAL
  Filled 2021-08-26 (×12): qty 1

## 2021-08-26 MED ORDER — CHLORHEXIDINE GLUCONATE CLOTH 2 % EX PADS
6.0000 | MEDICATED_PAD | Freq: Every day | CUTANEOUS | Status: DC
  Administered 2021-08-26 – 2021-09-01 (×6): 6 via TOPICAL

## 2021-08-26 MED ORDER — LORAZEPAM 2 MG/ML IJ SOLN
0.0000 mg | Freq: Two times a day (BID) | INTRAMUSCULAR | Status: AC
  Administered 2021-08-29: 23:00:00 2 mg via INTRAVENOUS
  Filled 2021-08-26: qty 1

## 2021-08-26 MED ORDER — HYDROMORPHONE HCL 1 MG/ML IJ SOLN
1.0000 mg | INTRAMUSCULAR | Status: DC | PRN
  Administered 2021-08-26 – 2021-08-28 (×5): 1 mg via INTRAVENOUS
  Filled 2021-08-26 (×6): qty 1

## 2021-08-26 MED ORDER — HYDROMORPHONE HCL 1 MG/ML IJ SOLN
INTRAMUSCULAR | Status: AC | PRN
  Administered 2021-08-26: 1 mg via INTRAVENOUS

## 2021-08-26 MED ORDER — DOCUSATE SODIUM 100 MG PO CAPS
100.0000 mg | ORAL_CAPSULE | Freq: Two times a day (BID) | ORAL | Status: DC
  Administered 2021-08-26 – 2021-08-30 (×8): 100 mg via ORAL
  Filled 2021-08-26 (×10): qty 1

## 2021-08-26 MED ORDER — ADULT MULTIVITAMIN W/MINERALS CH
1.0000 | ORAL_TABLET | Freq: Every day | ORAL | Status: DC
  Administered 2021-08-26 – 2021-09-07 (×11): 1 via ORAL
  Filled 2021-08-26 (×12): qty 1

## 2021-08-26 MED ORDER — GABAPENTIN 300 MG PO CAPS
300.0000 mg | ORAL_CAPSULE | Freq: Three times a day (TID) | ORAL | Status: DC
  Administered 2021-08-26 – 2021-09-07 (×34): 300 mg via ORAL
  Filled 2021-08-26 (×34): qty 1

## 2021-08-26 MED ORDER — METHOCARBAMOL 1000 MG/10ML IJ SOLN
500.0000 mg | Freq: Four times a day (QID) | INTRAVENOUS | Status: DC | PRN
  Administered 2021-08-26 – 2021-08-27 (×3): 500 mg via INTRAVENOUS
  Filled 2021-08-26 (×4): qty 5
  Filled 2021-08-26 (×2): qty 500

## 2021-08-26 MED ORDER — ONDANSETRON HCL 4 MG/2ML IJ SOLN: 4.0000 mg | Freq: Four times a day (QID) | INTRAMUSCULAR | Status: DC | PRN

## 2021-08-26 MED ORDER — PROCHLORPERAZINE EDISYLATE 10 MG/2ML IJ SOLN: 10.0000 mg | INTRAMUSCULAR | Status: DC | PRN

## 2021-08-26 MED ORDER — OXYCODONE HCL 5 MG PO TABS
10.0000 mg | ORAL_TABLET | ORAL | Status: DC | PRN
  Administered 2021-08-26 – 2021-08-31 (×6): 10 mg via ORAL
  Filled 2021-08-26 (×6): qty 2

## 2021-08-26 MED ORDER — DEXMEDETOMIDINE HCL IN NACL 400 MCG/100ML IV SOLN
0.2000 ug/kg/h | INTRAVENOUS | Status: DC
  Filled 2021-08-26: qty 100

## 2021-08-26 MED ORDER — ENOXAPARIN SODIUM 30 MG/0.3ML IJ SOSY: 30.0000 mg | PREFILLED_SYRINGE | Freq: Two times a day (BID) | INTRAMUSCULAR | Status: DC

## 2021-08-26 MED ORDER — THIAMINE HCL 100 MG/ML IJ SOLN
100.0000 mg | Freq: Every day | INTRAMUSCULAR | Status: DC
  Administered 2021-08-31: 100 mg via INTRAVENOUS
  Filled 2021-08-26: qty 2

## 2021-08-26 MED ORDER — LORAZEPAM 2 MG/ML IJ SOLN: 1.0000 mg | INTRAMUSCULAR | Status: AC | PRN

## 2021-08-26 NOTE — Progress Notes (Signed)
OT Cancellation Note  Patient Details Name: Devin Jones MRN: [id number] DOB: [birth year]   Cancelled Treatment:    Reason Eval/Treat Not Completed: Patient not medically ready.  Patient awaiting multiple surgeries for fixation. Will wait for further instruction regarding mobility/out of bed. Currently NWB BLE's and RUE.   Thresea Doble D Arbie Reisz 08/26/2021, 11:35 AM
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
## 2021-08-26 NOTE — Progress Notes (Signed)
Patient reexamined. No change in physical exam Discussed this case with Dr. Carola Frost of the orthopedic trauma service. Recommendation at this time is to continue to binder until Monday morning.  Pelvic external fixation will be performed at that time along with fracture fixation of the right wrist. MRI is pending on both knees. Tentative plan would be for lateral reconstruction on the left and medial repair versus reconstruction on the right this week likely Friday.  We will have to assess the capsular damage on the right-hand side in terms of timing of ACL and PCL reconstruction.

## 2021-08-26 NOTE — ED Notes (Signed)
Pelvic binder applied at 0010.

## 2021-08-26 NOTE — ED Notes (Addendum)
Bp 77/69, VO for PRBCx2, FFP x 2, blood bank made aware
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
## 2021-08-26 NOTE — ED Provider Notes (Signed)
Procedures .Ortho Injury Treatment  Date/Time: 08/26/2021 12:09 AM Performed by: Lutricia Feil, MD Authorized by: Jacalyn Lefevre, MD   Consent:    Consent obtained:  Verbal   Consent given by:  Patient   Risks discussed:  Fracture, nerve damage, restricted joint movement, vascular damage, recurrent dislocation and irreducible dislocation   Alternatives discussed:  No treatmentInjury location: knee Location details: right knee Injury type: dislocation Dislocation type: lateral Pre-procedure neurovascular assessment: neurovascularly intact Pre-procedure distal perfusion: normal Pre-procedure neurological function: normal Pre-procedure range of motion: reduced  Anesthesia: Local anesthesia used: no  Patient sedated: NoManipulation performed: yes Reduction method: direct traction Reduction successful: yes X-ray confirmed reduction: yes Immobilization: splint Splint type: knee immobilizer. Splint Applied by: Milon Dikes Post-procedure neurovascular assessment: post-procedure neurovascularly intact Post-procedure distal perfusion: normal Post-procedure neurological function: normal Post-procedure range of motion: normal   .Ortho Injury Treatment  Date/Time: 08/26/2021 12:10 AM Performed by: Lutricia Feil, MD Authorized by: Jacalyn Lefevre, MD   Consent:    Consent obtained:  Verbal   Consent given by:  Patient   Risks discussed:  Fracture, nerve damage, irreducible dislocation, recurrent dislocation, restricted joint movement, vascular damage and stiffness   Alternatives discussed:  No treatmentInjury location: wrist Location details: right wrist Injury type: fracture-dislocation Pre-procedure neurovascular assessment: neurovascularly intact Pre-procedure distal perfusion: normal Pre-procedure neurological function: normal Pre-procedure range of motion: reduced  Anesthesia: Local anesthesia used: no  Patient sedated: NoManipulation performed: yes Skin  traction used: yes Skeletal traction used: no Reduction successful: yes X-ray confirmed reduction: yes Immobilization: splint Splint type: sugar tong Splint Applied by: Ortho Tech Supplies used: cotton padding Post-procedure neurovascular assessment: post-procedure neurovascularly intact Post-procedure distal perfusion: normal Post-procedure neurological function: normal Post-procedure range of motion: unchanged      Lutricia Feil, MD 08/26/21 0011    Jacalyn Lefevre, MD 08/26/21 [phone]
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
## 2021-08-26 NOTE — Progress Notes (Signed)
At MRI, continuous monitoring by RN.

## 2021-08-26 NOTE — Progress Notes (Signed)
I asked pt if he wanted me to call anyone to notify them of his being in the hospital. Pt declines, and states he has his cell phone and will make any calls that need to be made.

## 2021-08-26 NOTE — Progress Notes (Signed)
PT Cancellation Note  Patient Details Name: Devin Jones MRN: [id number] DOB: 11-30-1965   Cancelled Treatment:    Reason Eval/Treat Not Completed: Patient not medically ready (pt awaiting multiple surgeries for fixation). Will wait for further instruction regarding mobility. Currently NWB BLE's and RUE.   Lyanne Co, PT  Acute Rehab Services  Pager [phone] Office [phone]    Lawana Chambers Atwell Mcdanel 08/26/2021, 7:53 AM

## 2021-08-27 LAB — HEPATIC FUNCTION PANEL
ALT: 170 U/L — ABNORMAL HIGH (ref 0–44)
AST: 235 U/L — ABNORMAL HIGH (ref 15–41)
Albumin: 2.4 g/dL — ABNORMAL LOW (ref 3.5–5.0)
Alkaline Phosphatase: 58 U/L (ref 38–126)
Bilirubin, Direct: 0.2 mg/dL (ref 0.0–0.2)
Indirect Bilirubin: 0.5 mg/dL (ref 0.3–0.9)
Total Bilirubin: 0.7 mg/dL (ref 0.3–1.2)
Total Protein: 4.6 g/dL — ABNORMAL LOW (ref 6.5–8.1)

## 2021-08-27 LAB — BPAM FFP
Blood Product Expiration Date: 202212132359
Blood Product Expiration Date: 202212132359
ISSUE DATE / TIME: 202212100126
ISSUE DATE / TIME: 202212100126
Unit Type and Rh: 6200
Unit Type and Rh: 6200

## 2021-08-27 LAB — CBC
HCT: 34.3 % — ABNORMAL LOW (ref 39.0–52.0)
Hemoglobin: 11.7 g/dL — ABNORMAL LOW (ref 13.0–17.0)
MCH: 30.4 pg (ref 26.0–34.0)
MCHC: 34.1 g/dL (ref 30.0–36.0)
MCV: 89.1 fL (ref 80.0–100.0)
Platelets: 93 10*3/uL — ABNORMAL LOW (ref 150–400)
RBC: 3.85 MIL/uL — ABNORMAL LOW (ref 4.22–5.81)
RDW: 15.9 % — ABNORMAL HIGH (ref 11.5–15.5)
WBC: 7.3 10*3/uL (ref 4.0–10.5)
nRBC: 0 % (ref 0.0–0.2)

## 2021-08-27 LAB — PREPARE FRESH FROZEN PLASMA

## 2021-08-27 LAB — BASIC METABOLIC PANEL
Anion gap: 9 (ref 5–15)
BUN: 15 mg/dL (ref 6–20)
CO2: 22 mmol/L (ref 22–32)
Calcium: 7.6 mg/dL — ABNORMAL LOW (ref 8.9–10.3)
Chloride: 104 mmol/L (ref 98–111)
Creatinine, Ser: 0.92 mg/dL (ref 0.61–1.24)
GFR, Estimated: >60 mL/min (ref >60)
Glucose, Bld: 134 mg/dL — ABNORMAL HIGH (ref 70–99)
Potassium: 4.3 mmol/L (ref 3.5–5.1)
Sodium: 135 mmol/L (ref 135–145)

## 2021-08-27 LAB — LIPASE, BLOOD: Lipase: 23 U/L (ref 11–51)

## 2021-08-27 NOTE — Progress Notes (Signed)
PT Cancellation Note  Patient Details Name: Devin Jones MRN: [id number] DOB: 20-Jun-1966   Cancelled Treatment:    Reason Eval/Treat Not Completed: Patient not medically ready Patient awaiting multiple surgeries for fixation. Will wait for further instruction regarding mobility/out of bed. Currently NWB BLE's and RUE. Will follow post op.   [name] A [name] 08/27/2021, 7:27 AM [place], PT, DPT Acute Rehabilitation Services Pager [phone] Office [phone]
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
## 2021-08-27 NOTE — Progress Notes (Signed)
Spoke with Dr. Janee Morn about indication for continuing foley catheter. Due to nature of the patient's pelvic fractures, Dr. Janee Morn would like foley to remain in until after surgery.   Beryl Meager, RN
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
## 2021-08-28 ENCOUNTER — Inpatient Hospital Stay (HOSPITAL_COMMUNITY): Payer: Medicaid Other

## 2021-08-28 ENCOUNTER — Encounter (HOSPITAL_COMMUNITY): Admission: EM | Disposition: A | Discharge status: Rehabilitation Facility | Payer: Self-pay | Source: Home / Self Care

## 2021-08-28 ENCOUNTER — Encounter (HOSPITAL_COMMUNITY): Payer: Self-pay

## 2021-08-28 ENCOUNTER — Inpatient Hospital Stay (HOSPITAL_COMMUNITY): Payer: Medicaid Other | Admitting: Certified Registered Nurse Anesthetist

## 2021-08-28 HISTORY — PX: SACRO-ILIAC PINNING: SHX5050

## 2021-08-28 HISTORY — PX: ORIF RADIAL FRACTURE: SHX5113

## 2021-08-28 LAB — HEPATIC FUNCTION PANEL
ALT: 125 U/L — ABNORMAL HIGH (ref 0–44)
AST: 126 U/L — ABNORMAL HIGH (ref 15–41)
Albumin: 2.2 g/dL — ABNORMAL LOW (ref 3.5–5.0)
Alkaline Phosphatase: 62 U/L (ref 38–126)
Bilirubin, Direct: 0.1 mg/dL (ref 0.0–0.2)
Indirect Bilirubin: 0.7 mg/dL (ref 0.3–0.9)
Total Bilirubin: 0.8 mg/dL (ref 0.3–1.2)
Total Protein: 4.9 g/dL — ABNORMAL LOW (ref 6.5–8.1)

## 2021-08-28 LAB — CBC
HCT: 32.2 % — ABNORMAL LOW (ref 39.0–52.0)
Hemoglobin: 11 g/dL — ABNORMAL LOW (ref 13.0–17.0)
MCH: 30.9 pg (ref 26.0–34.0)
MCHC: 34.2 g/dL (ref 30.0–36.0)
MCV: 90.4 fL (ref 80.0–100.0)
Platelets: 89 10*3/uL — ABNORMAL LOW (ref 150–400)
RBC: 3.56 MIL/uL — ABNORMAL LOW (ref 4.22–5.81)
RDW: 15.2 % (ref 11.5–15.5)
WBC: 7.7 10*3/uL (ref 4.0–10.5)
nRBC: 0 % (ref 0.0–0.2)

## 2021-08-28 LAB — BASIC METABOLIC PANEL
Anion gap: 5 (ref 5–15)
BUN: 11 mg/dL (ref 6–20)
CO2: 26 mmol/L (ref 22–32)
Calcium: 8.2 mg/dL — ABNORMAL LOW (ref 8.9–10.3)
Chloride: 104 mmol/L (ref 98–111)
Creatinine, Ser: 0.88 mg/dL (ref 0.61–1.24)
GFR, Estimated: >60 mL/min (ref >60)
Glucose, Bld: 121 mg/dL — ABNORMAL HIGH (ref 70–99)
Potassium: 4.4 mmol/L (ref 3.5–5.1)
Sodium: 135 mmol/L (ref 135–145)

## 2021-08-28 LAB — POCT I-STAT EG7
Acid-Base Excess: 0 mmol/L (ref 0.0–2.0)
Acid-Base Excess: 0 mmol/L (ref 0.0–2.0)
Bicarbonate: 25.5 mmol/L (ref 20.0–28.0)
Bicarbonate: 26.1 mmol/L (ref 20.0–28.0)
Calcium, Ion: 1.19 mmol/L (ref 1.15–1.40)
Calcium, Ion: 1.15 mmol/L (ref 1.15–1.40)
HCT: 27 % — ABNORMAL LOW (ref 39.0–52.0)
HCT: 27 % — ABNORMAL LOW (ref 39.0–52.0)
Hemoglobin: 9.2 g/dL — ABNORMAL LOW (ref 13.0–17.0)
Hemoglobin: 9.2 g/dL — ABNORMAL LOW (ref 13.0–17.0)
O2 Saturation: 62 %
O2 Saturation: 77 %
Potassium: 4.4 mmol/L (ref 3.5–5.1)
Potassium: 4.7 mmol/L (ref 3.5–5.1)
Sodium: 135 mmol/L (ref 135–145)
Sodium: 136 mmol/L (ref 135–145)
TCO2: 27 mmol/L (ref 22–32)
TCO2: 28 mmol/L (ref 22–32)
pCO2, Ven: 45.5 mmHg (ref 44.0–60.0)
pCO2, Ven: 48.4 mmHg (ref 44.0–60.0)
pH, Ven: 7.356 (ref 7.250–7.430)
pH, Ven: 7.341 (ref 7.250–7.430)
pO2, Ven: 34 mmHg (ref 32.0–45.0)
pO2, Ven: 45 mmHg (ref 32.0–45.0)

## 2021-08-28 LAB — LIPASE, BLOOD: Lipase: 23 U/L (ref 11–51)

## 2021-08-28 LAB — PREPARE RBC (CROSSMATCH)

## 2021-08-28 IMAGING — DX DG ELBOW 2V*R*
2 series · 2 of 2 positions shown · non-contrast
Comparison: None.

CLINICAL DATA: Fracture, postop

EXAM:
RIGHT ELBOW - 2 VIEW

[elbow ap]
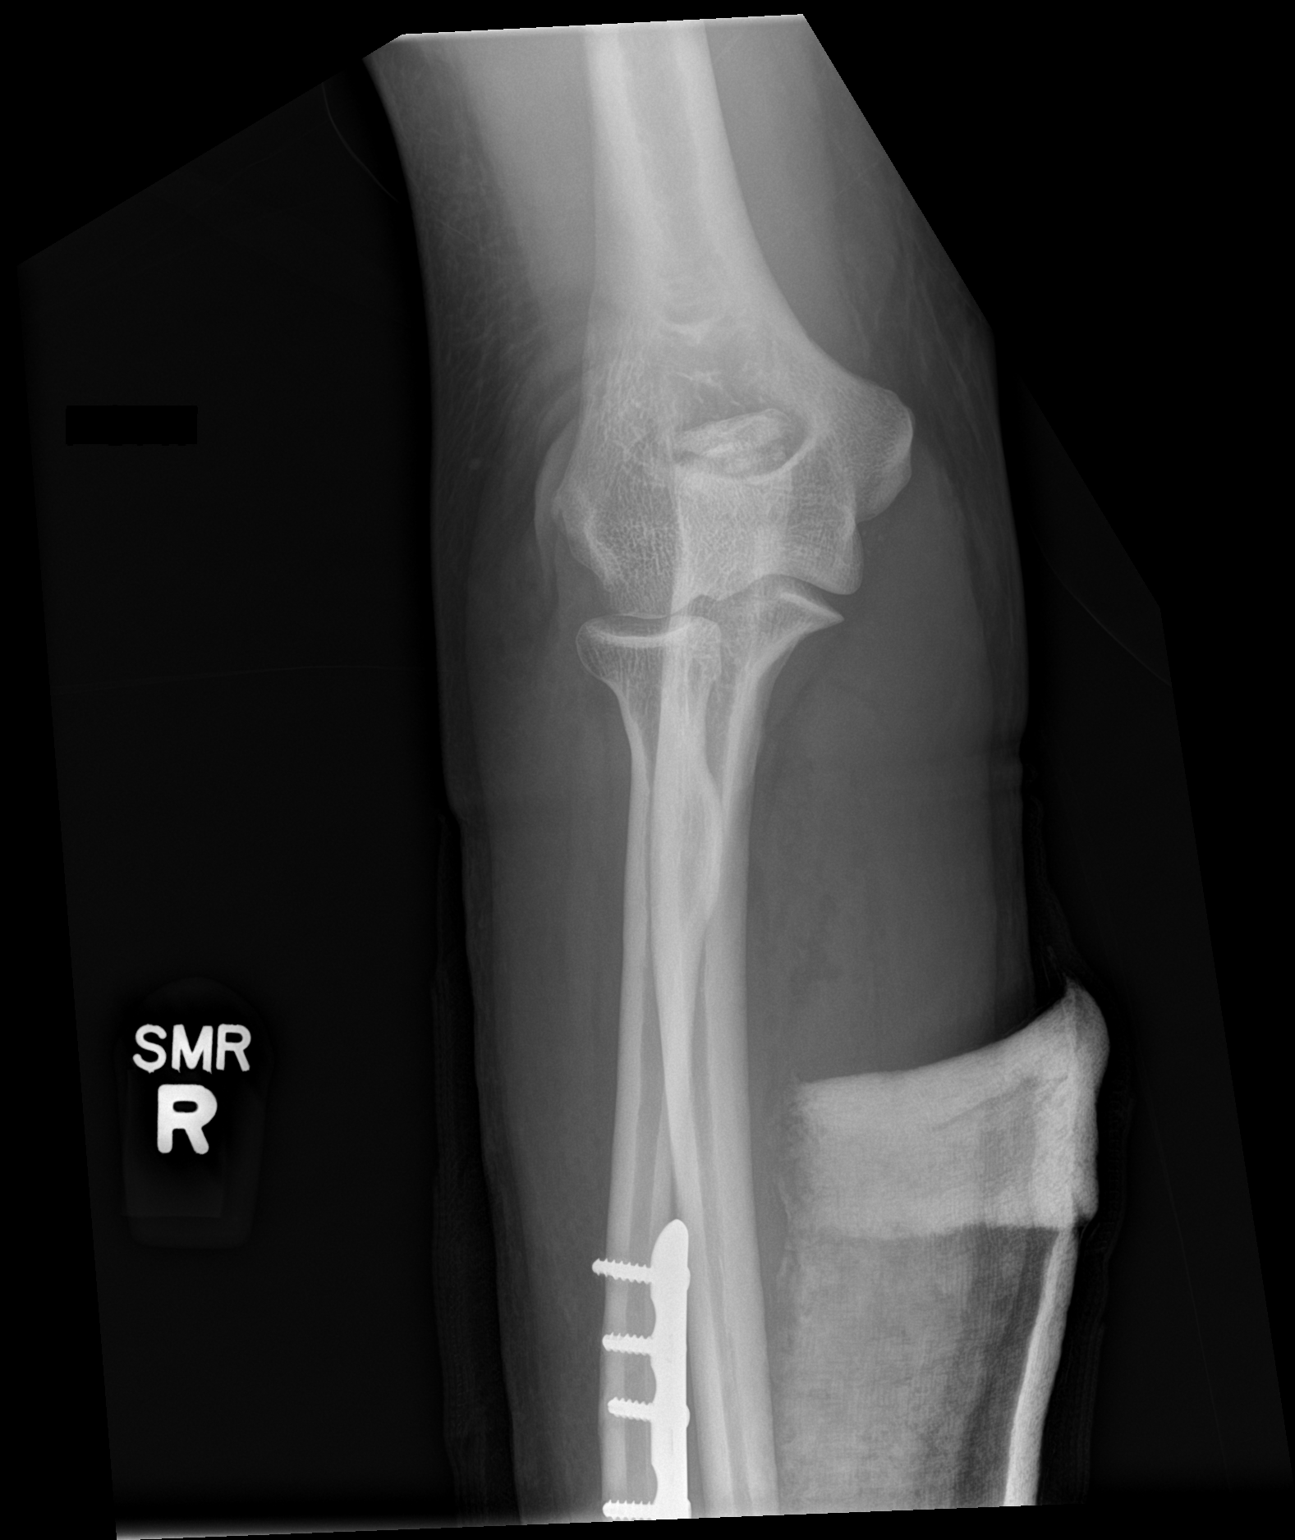

[elbow lat]
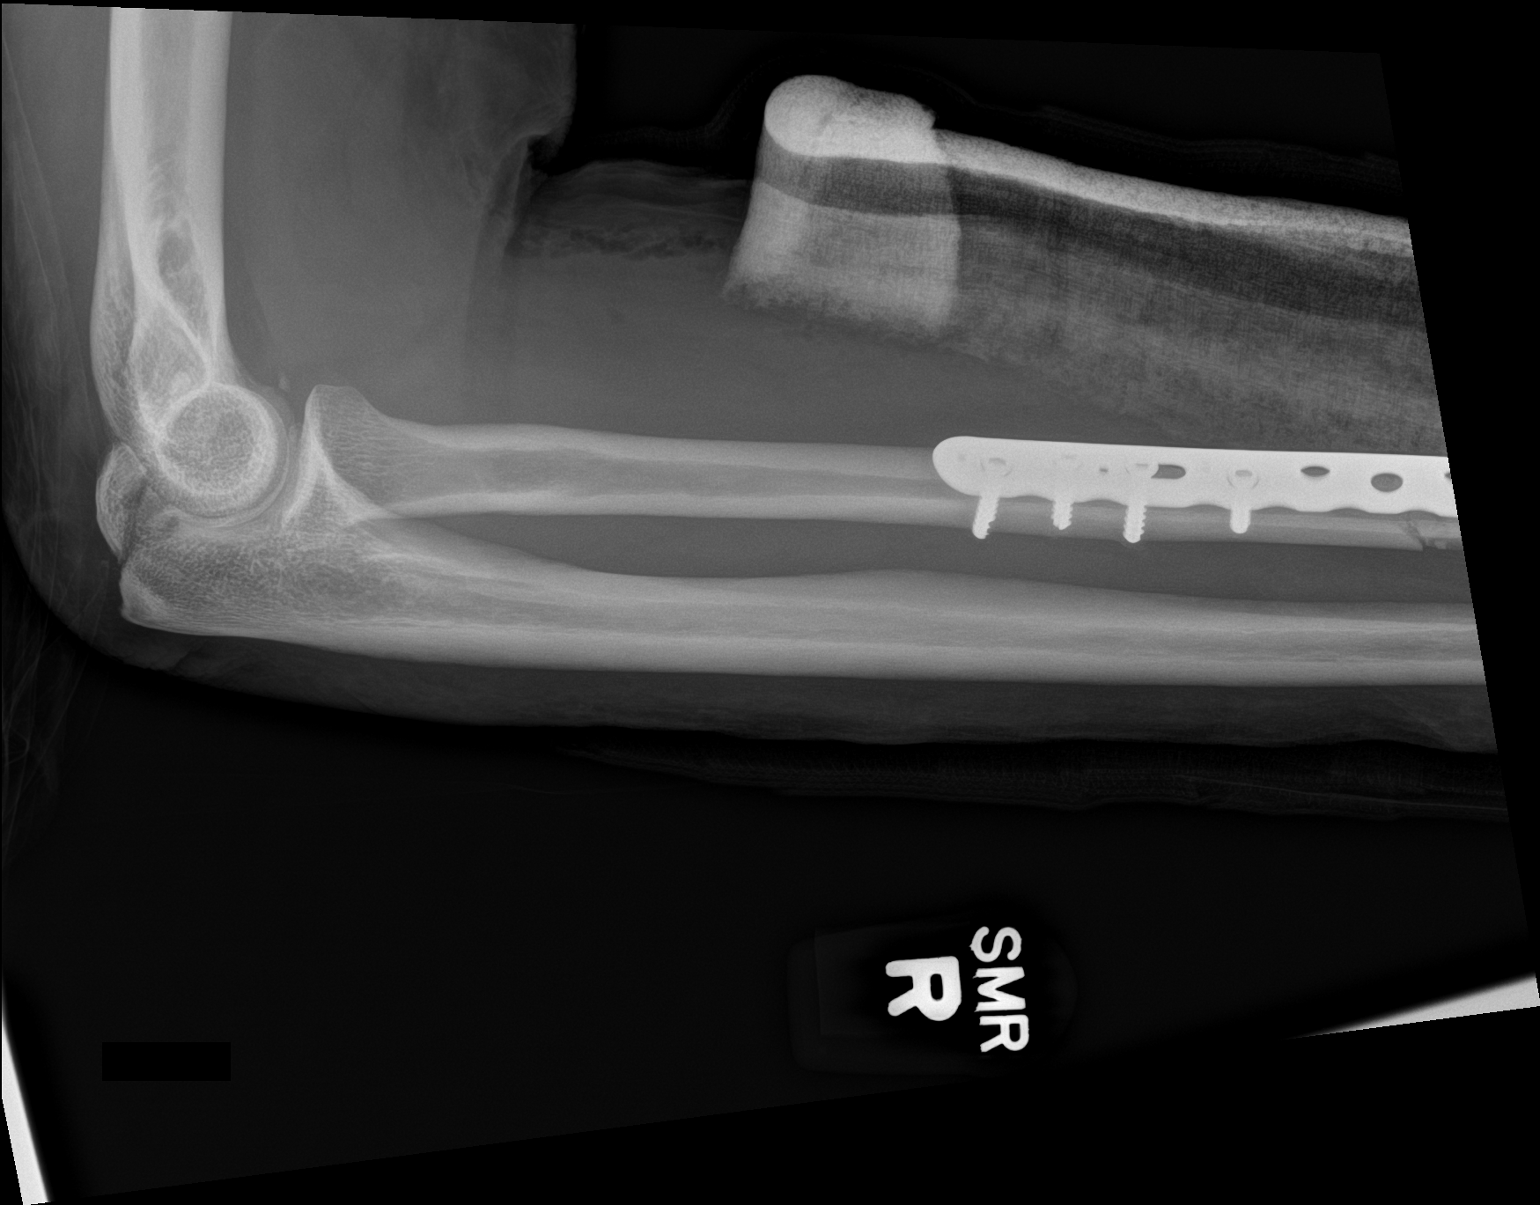

[2 of 2 positions shown; findings below may reference images not displayed]

FINDINGS: Plate and screw fixation across the midshaft radius fracture with
anatomic alignment. Fracture of the olecranon again noted with
minimal displacement, unchanged. Probable joint effusion.
IMPRESSION: Stable appearance of the fracture through the olecranon.

## 2021-08-28 IMAGING — DX DG PELVIS 3+V JUDET
6 series · 6 of 6 positions shown · non-contrast
Comparison: CT [DATE]

CLINICAL DATA: Postop imaging.  Fracture.

EXAM:
JUDET PELVIS - 3+ VIEW

[pelvis ap]
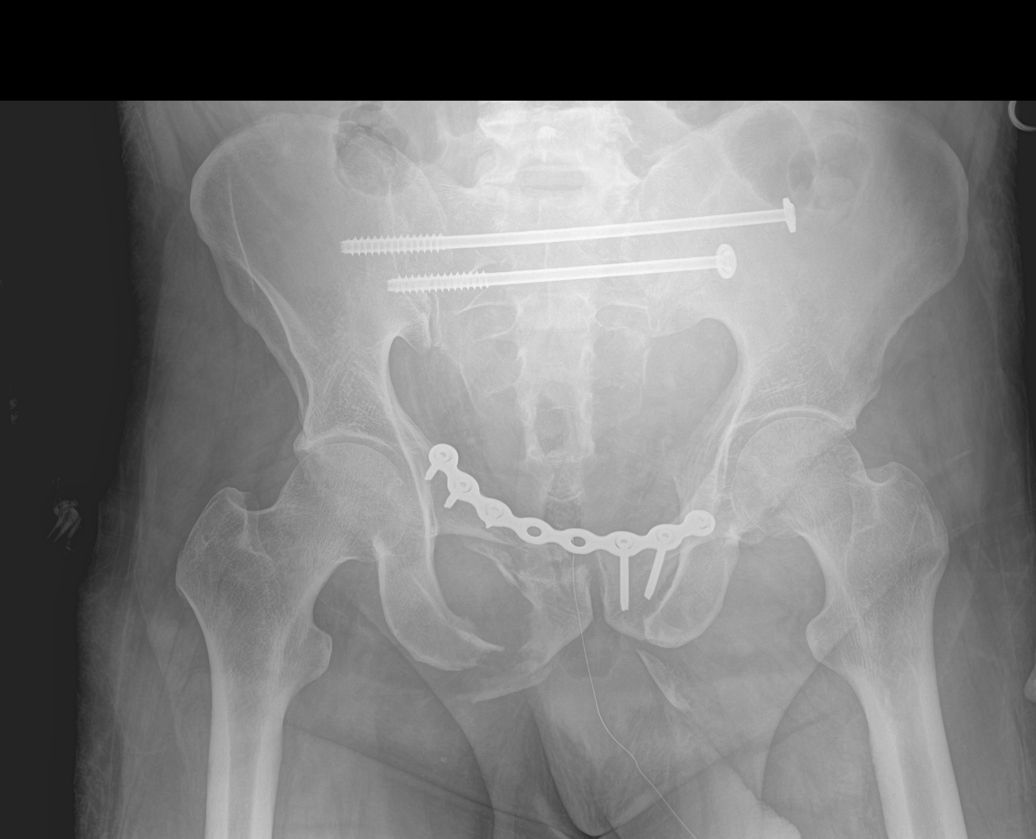

[pelvis obl (1 of 3)]
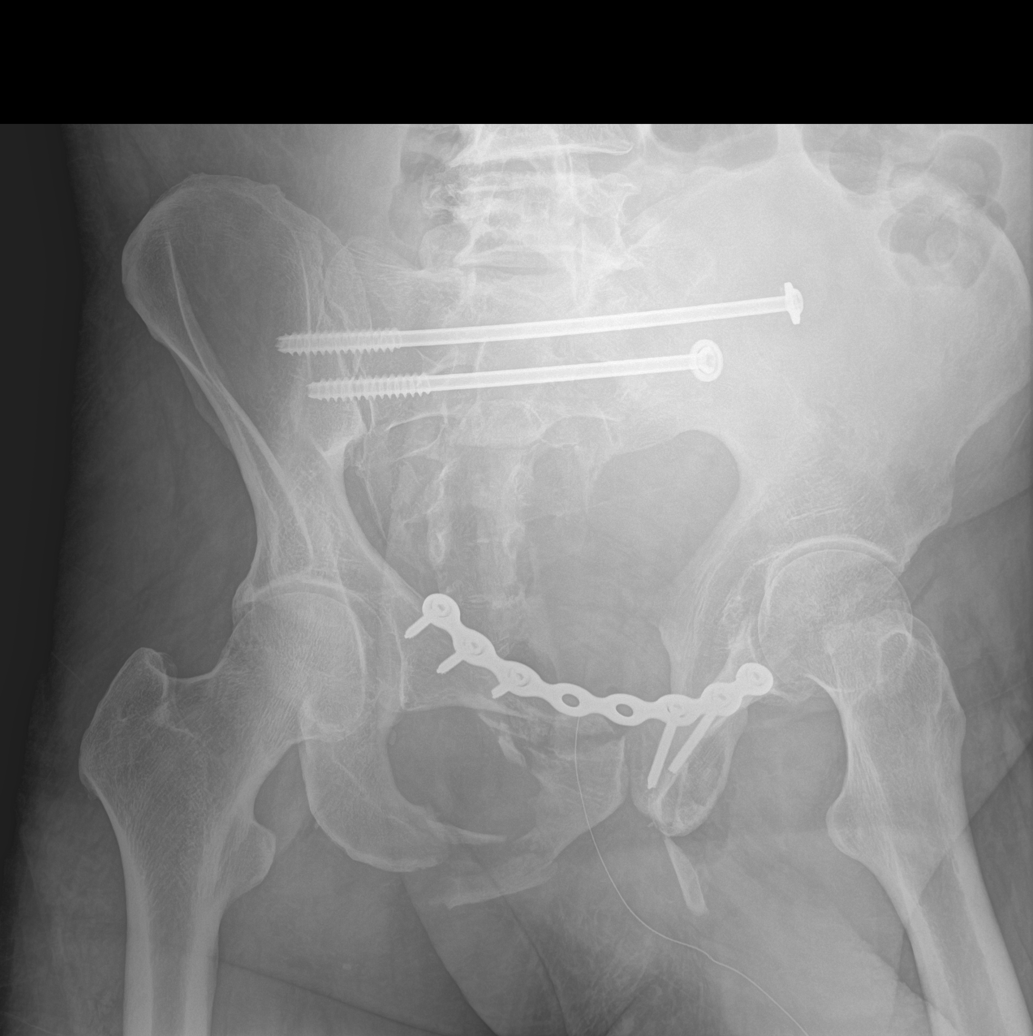

[pelvis obl (2 of 3)]
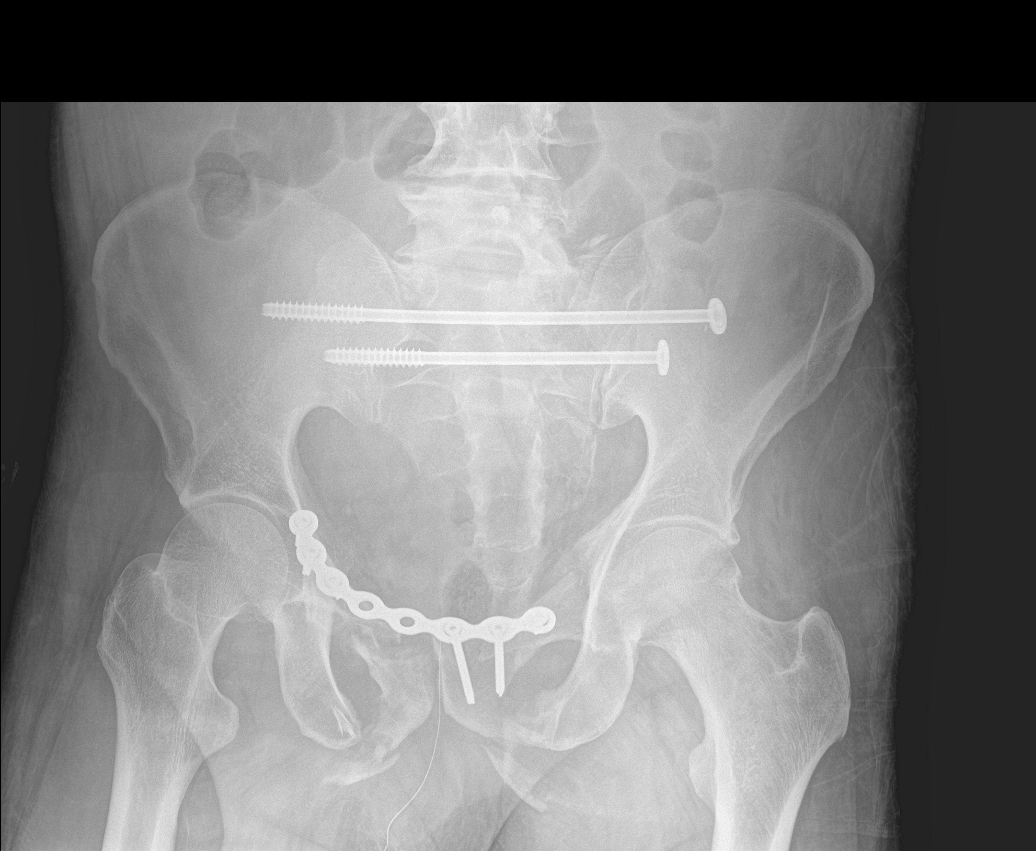

[pelvis inlet]
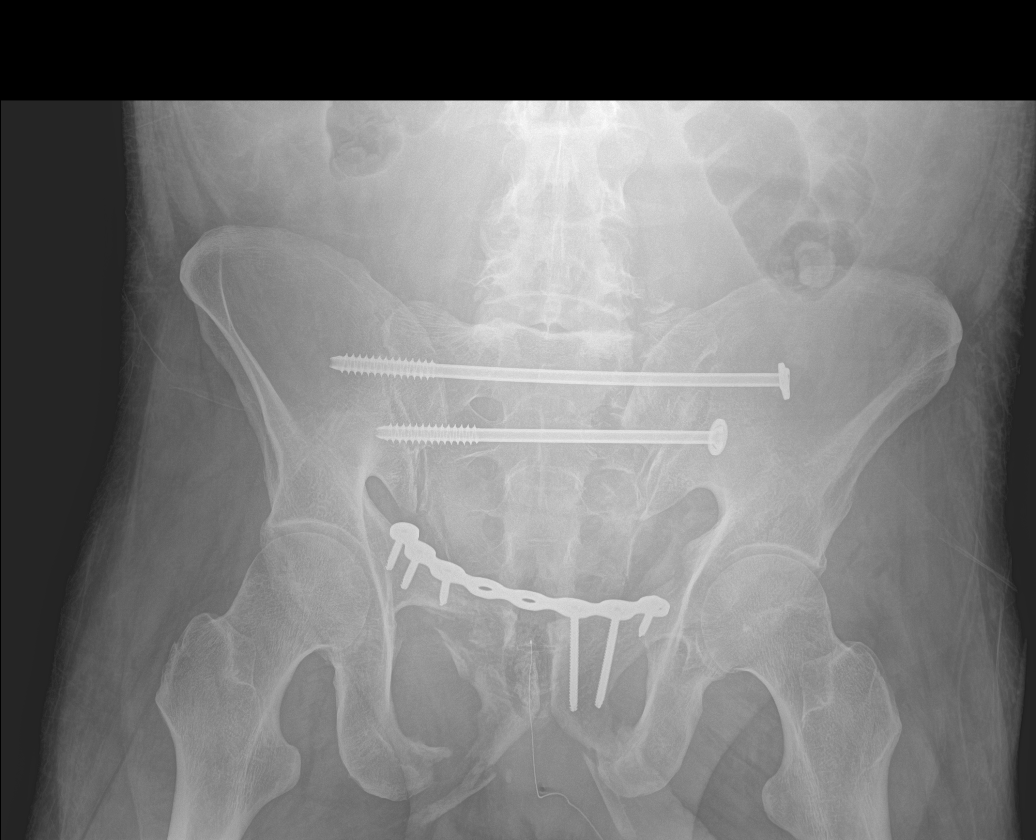

[pelvis outlet]
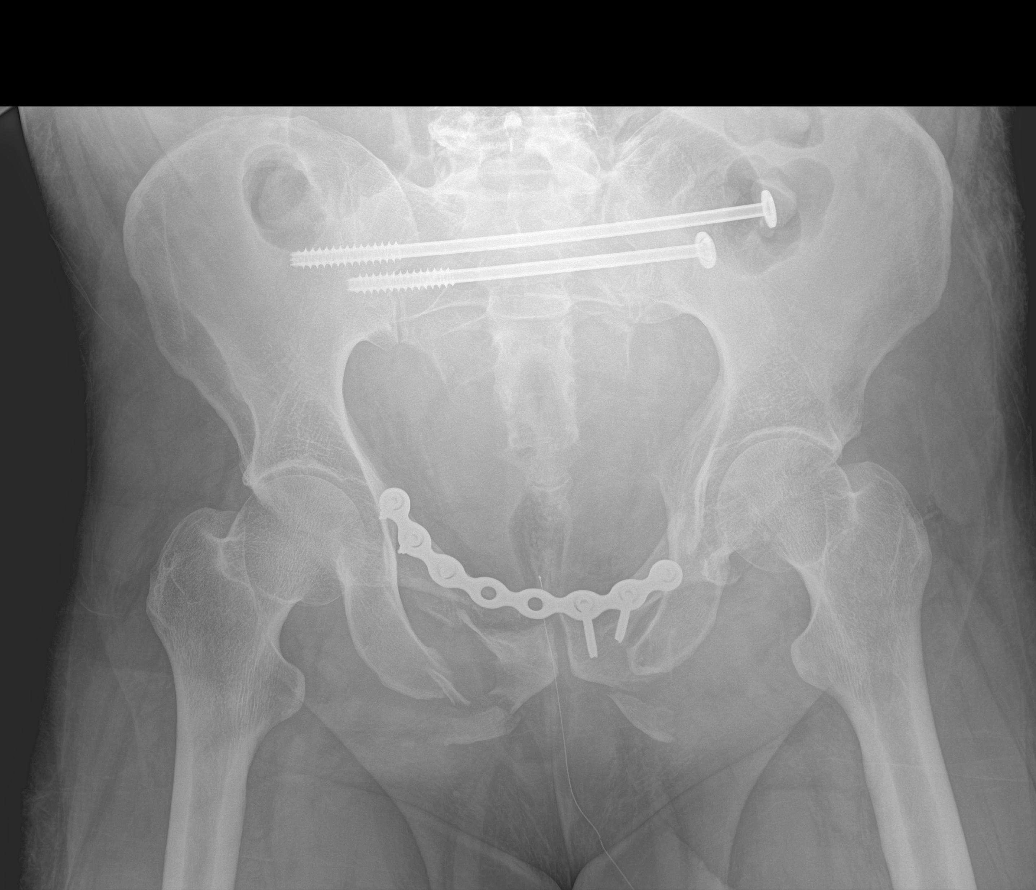

[pelvis obl (3 of 3)]
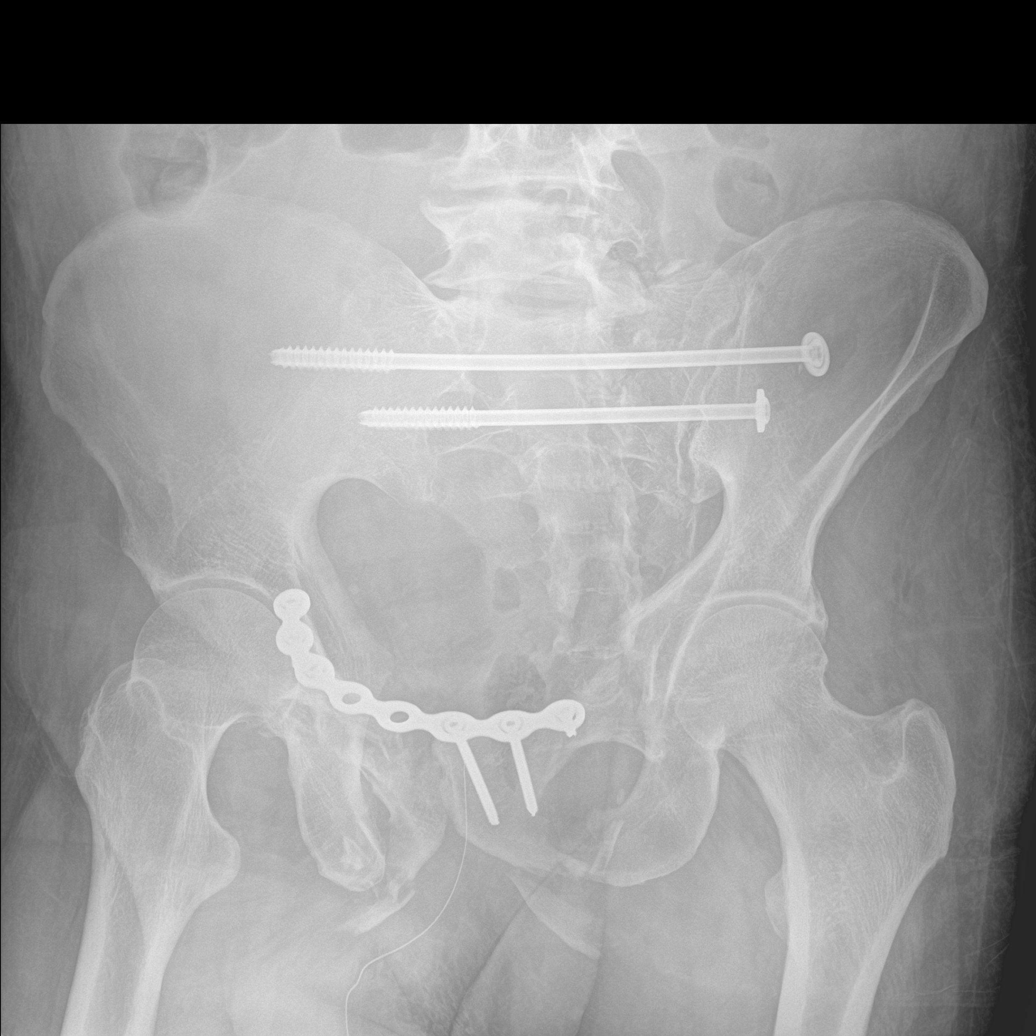

[6 of 6 positions shown; findings below may reference images not displayed]

FINDINGS: Screw fixation across the sacrum/SI joints. Plate and screw fixation
across the pubic symphysis. Displaced inferior and superior pubic
rami fractures bilaterally.
IMPRESSION: Internal fixation across the sacral fractures and the pubic
symphysis. Continued displacement across the inferior and superior
pubic rami fractures bilaterally.

## 2021-08-28 IMAGING — RF DG PELVIS 3+V JUDET
1 series · 6 of 6 positions shown · non-contrast
Comparison: [DATE]

CLINICAL DATA: Sacroiliac fusion

EXAM:
JUDET PELVIS - 3+ VIEW

[Series 1: run · 6 of 6 slices shown]
[im 1/6]
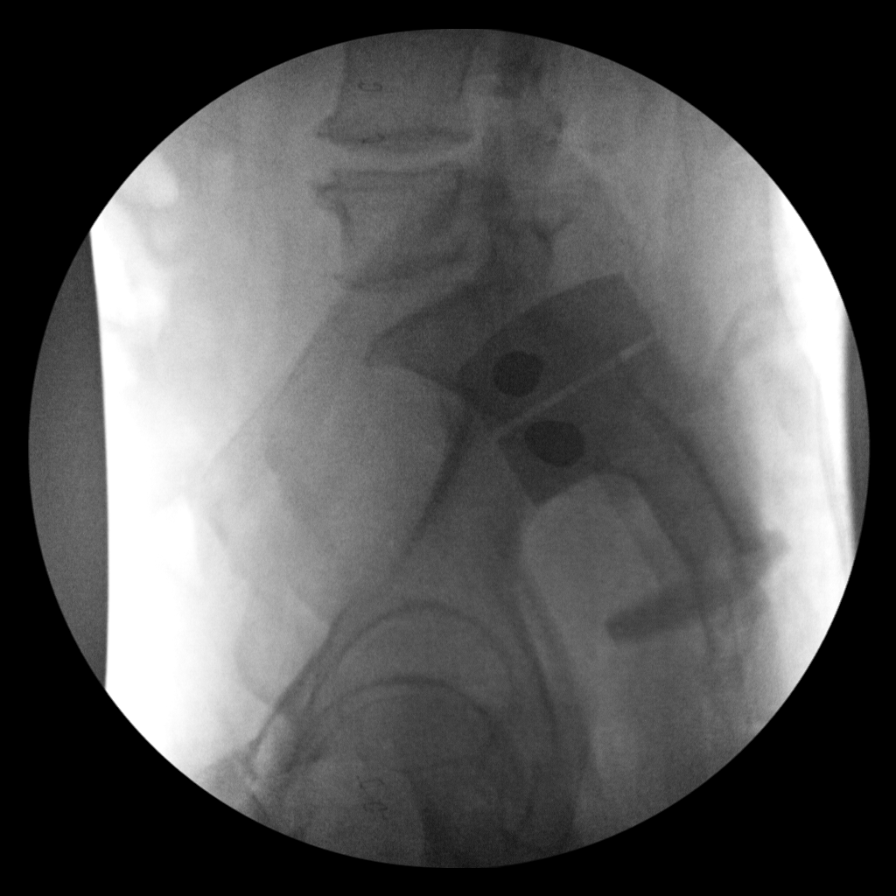
[im 2/6]
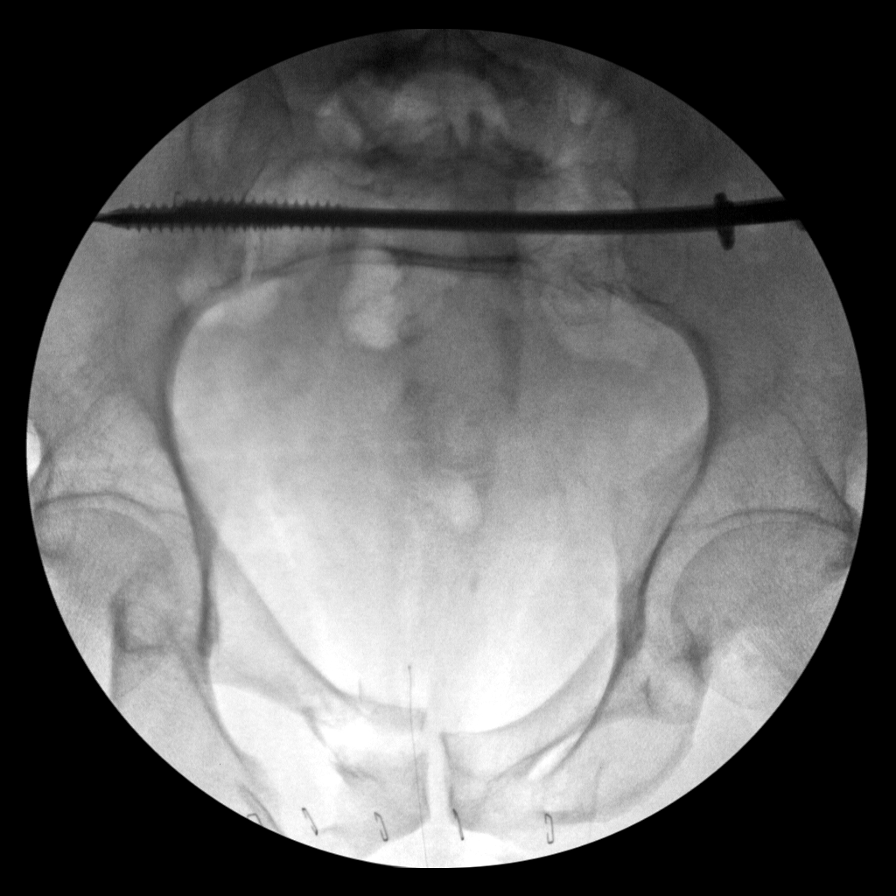
[im 3/6]
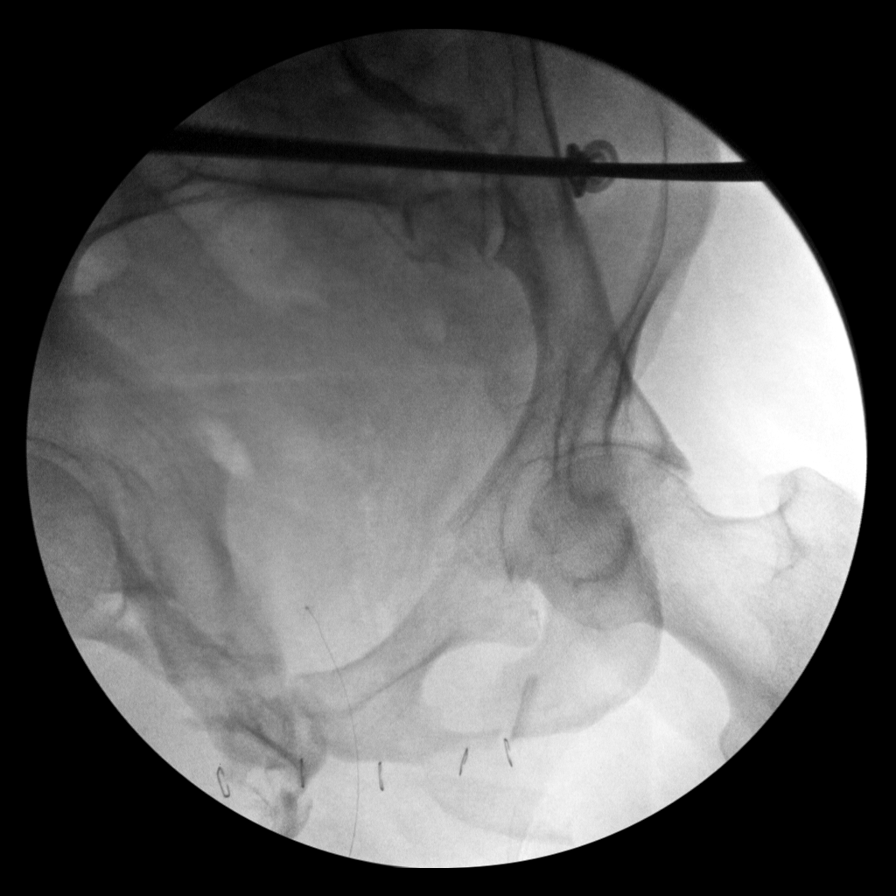
[im 4/6]
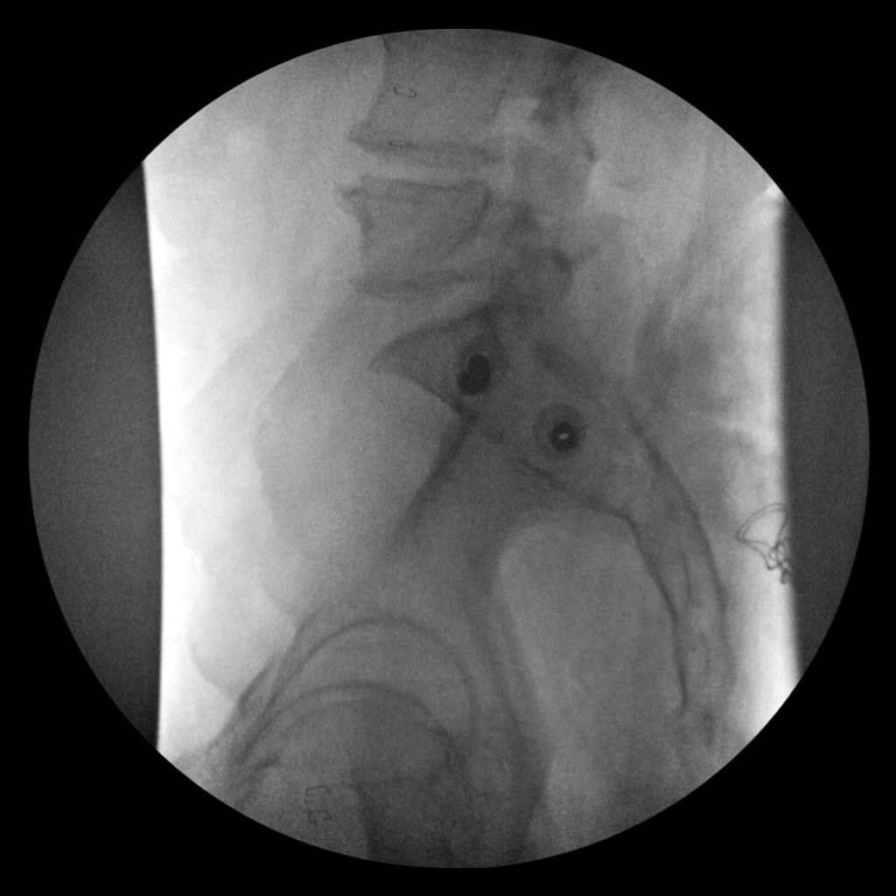
[im 5/6]
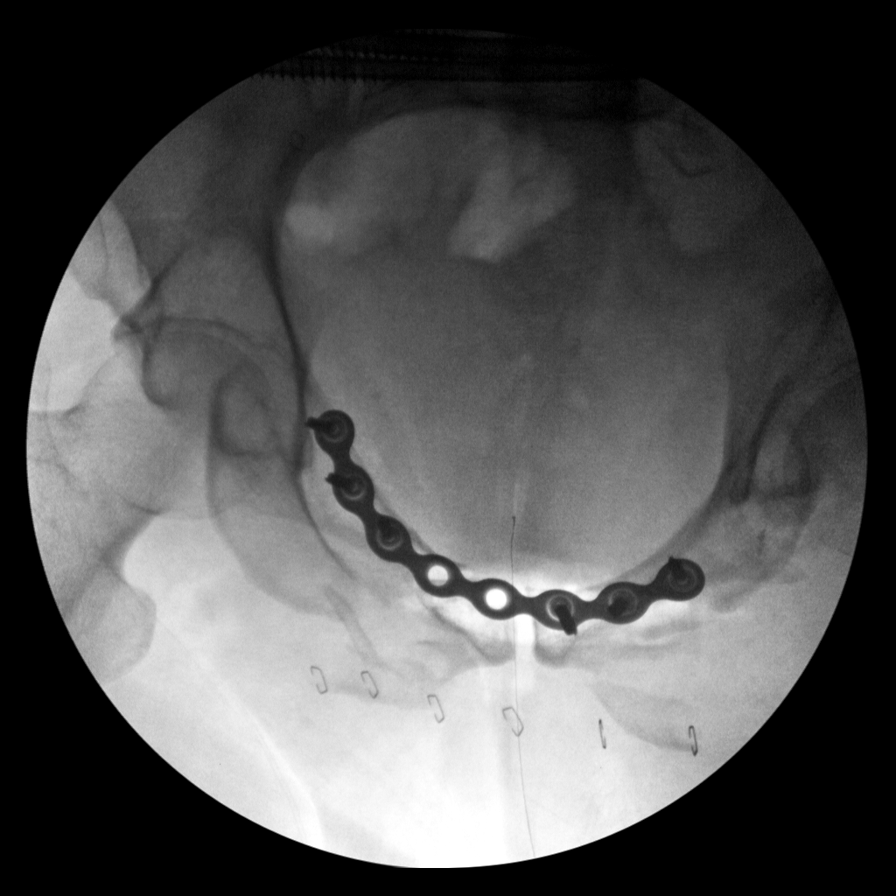
[im 6/6]
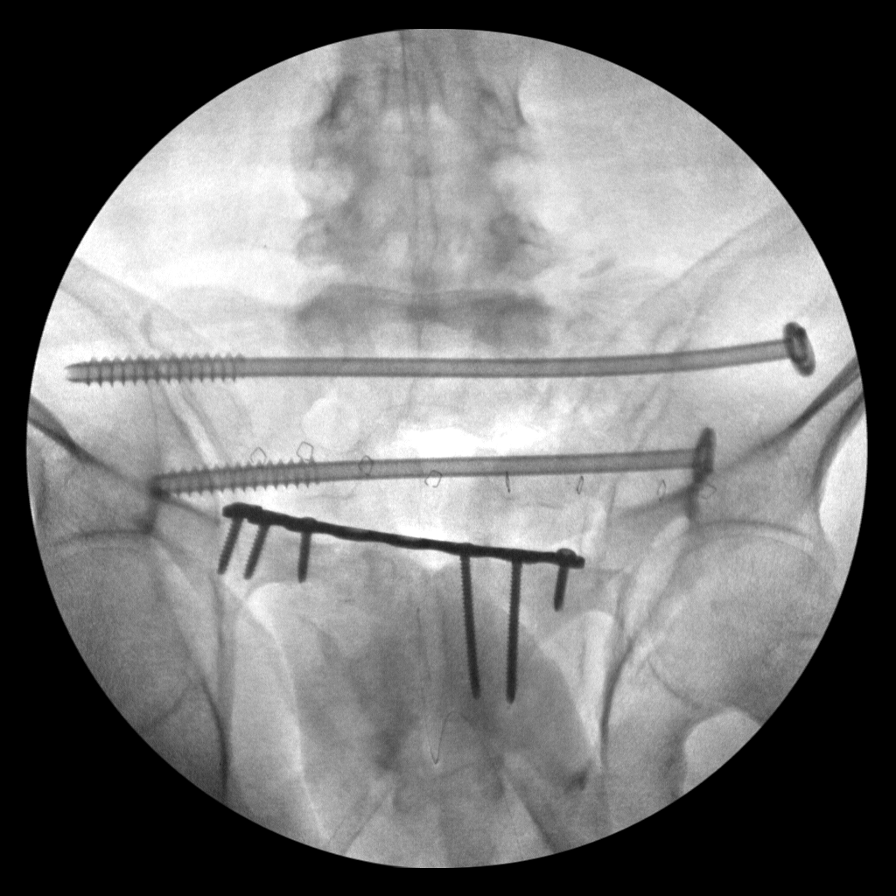

[6 of 6 positions shown; findings below may reference images not displayed]

FINDINGS: Six fluoroscopic images are obtained during the performance of the
procedure and are provided for interpretation only. Images
demonstrate placement of malleable plate and screw fixation across
the pubic symphysis, with near anatomic alignment. The right
superior and inferior rami fracture seen previously are again noted.
Two cannulated screws and washers are also identified traversing the
bilateral sacroiliac joints, with near anatomic alignment of the
left sacral ala fracture. Please refer to operative report.

FLUOROSCOPY TIME:  2 minutes 27 seconds
IMPRESSION: 1. ORIF of the pubic symphysis and left sacroiliac fractures as
above.

## 2021-08-28 IMAGING — DX DG FOREARM 2V*R*
2 series · 2 of 2 positions shown · non-contrast
Comparison: [DATE]

CLINICAL DATA: Postop

EXAM:
RIGHT FOREARM - 2 VIEW

[forearm ap]
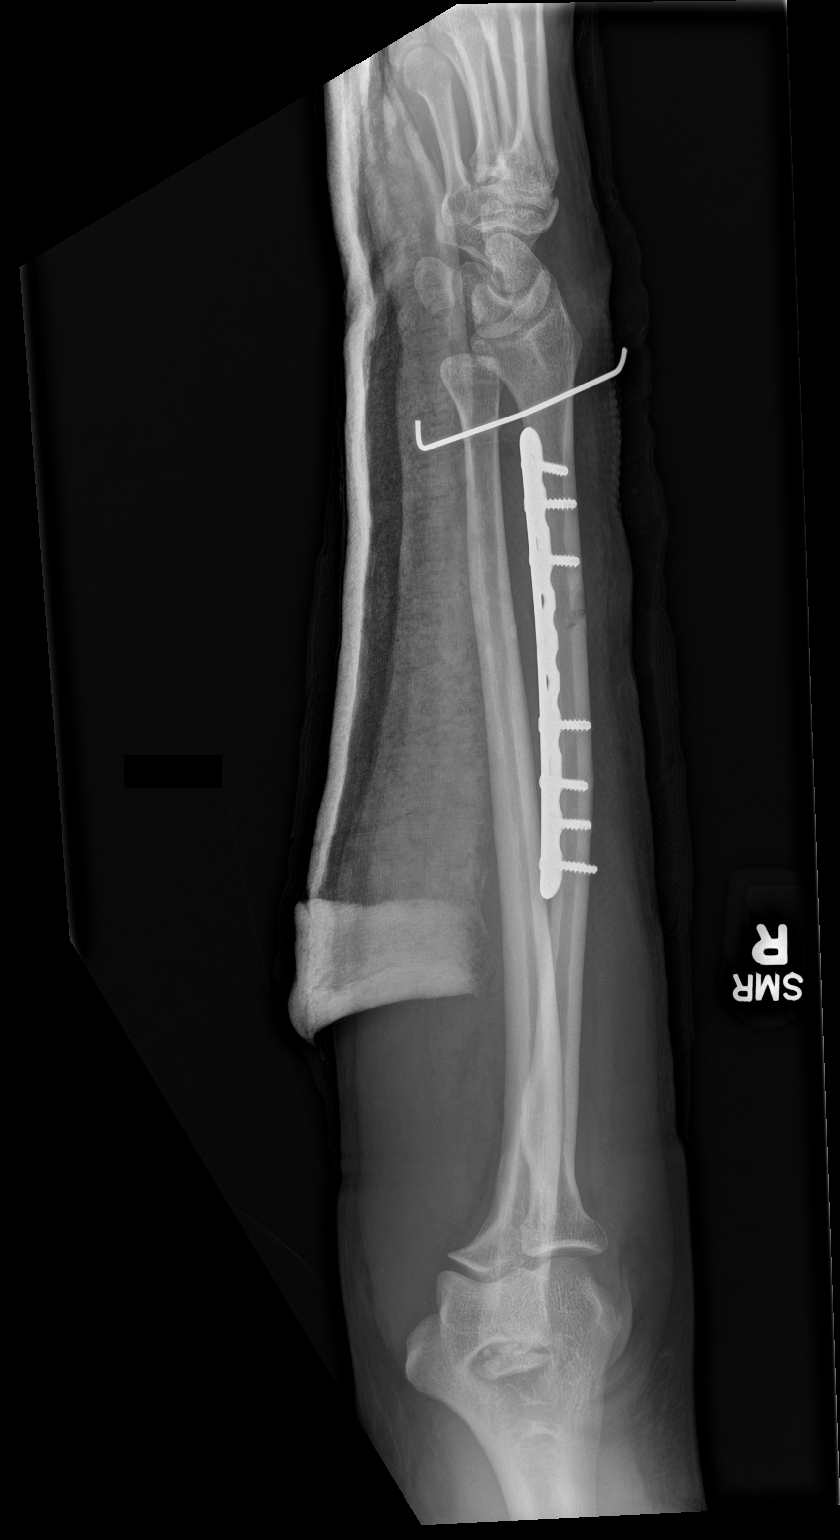

[forearm lat]
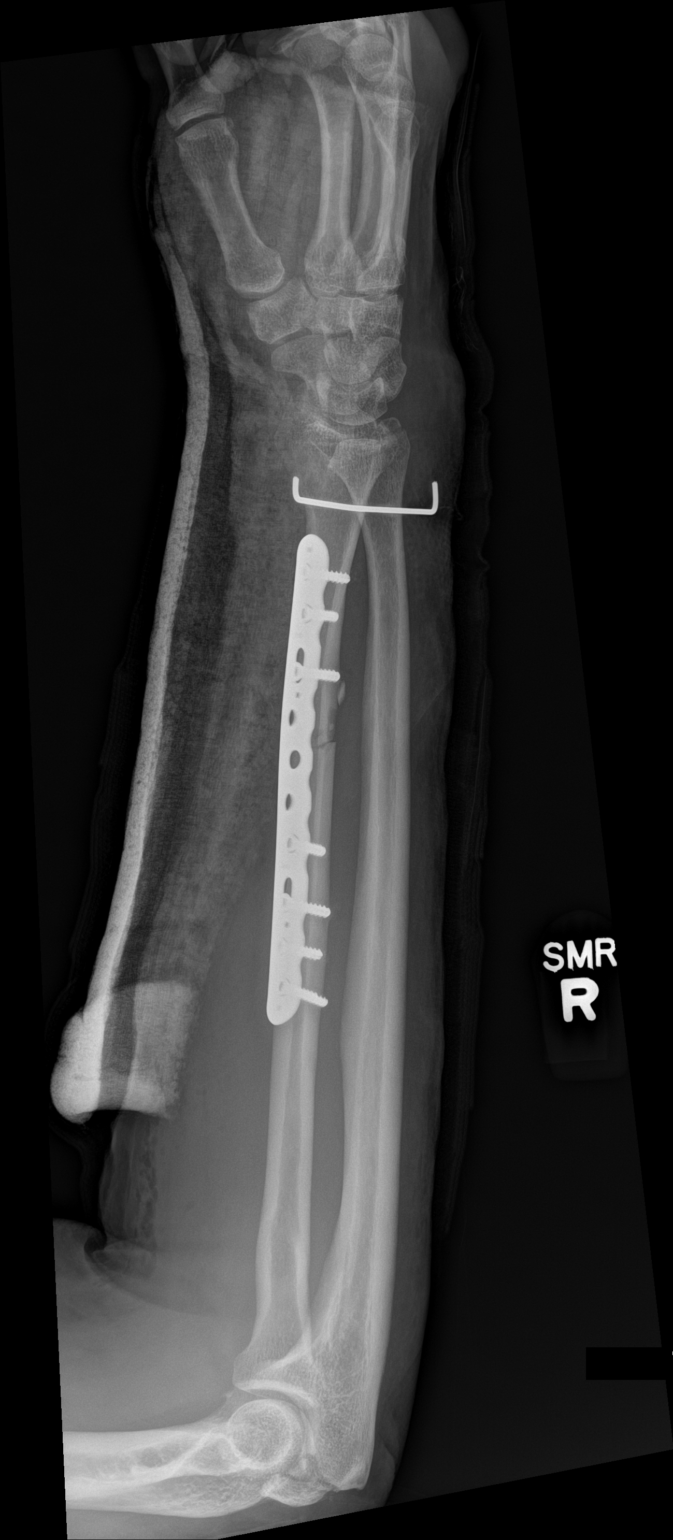

[2 of 2 positions shown; findings below may reference images not displayed]

FINDINGS: Plate and screw fixation across the mid shaft radial fracture. Wire
placed across the distal radius and ulna. Anatomic alignment.
IMPRESSION: Internal fixation.  No visible complicating feature.

## 2021-08-28 IMAGING — RF DG FOREARM 2V*R*
1 series · 6 of 6 positions shown · non-contrast
Comparison: [DATE]

CLINICAL DATA: Fracture apparent

EXAM:
RIGHT FOREARM - 2 VIEW

[Series 1: run · 6 of 6 slices shown]
[im 1/6]
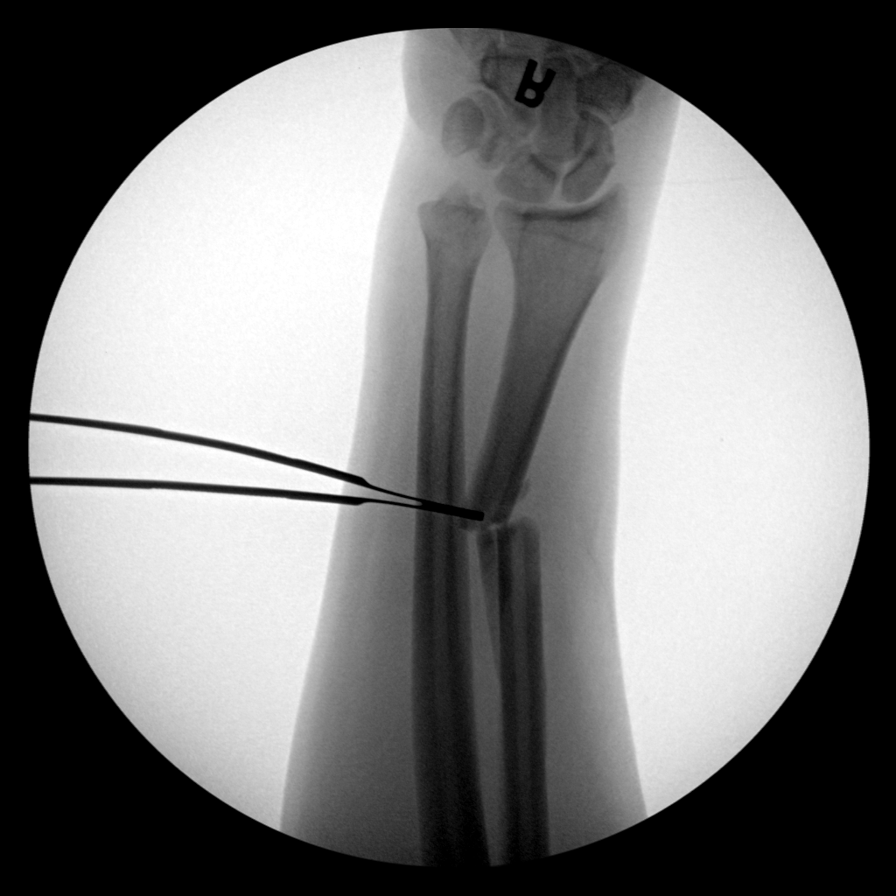
[im 2/6]
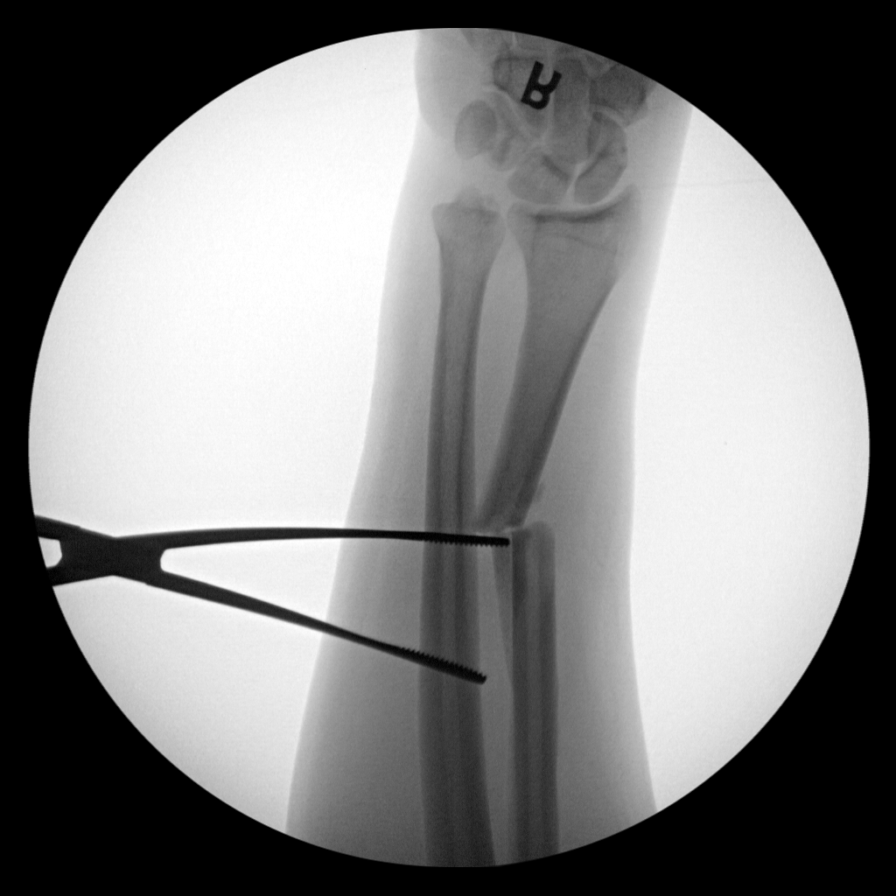
[im 3/6]
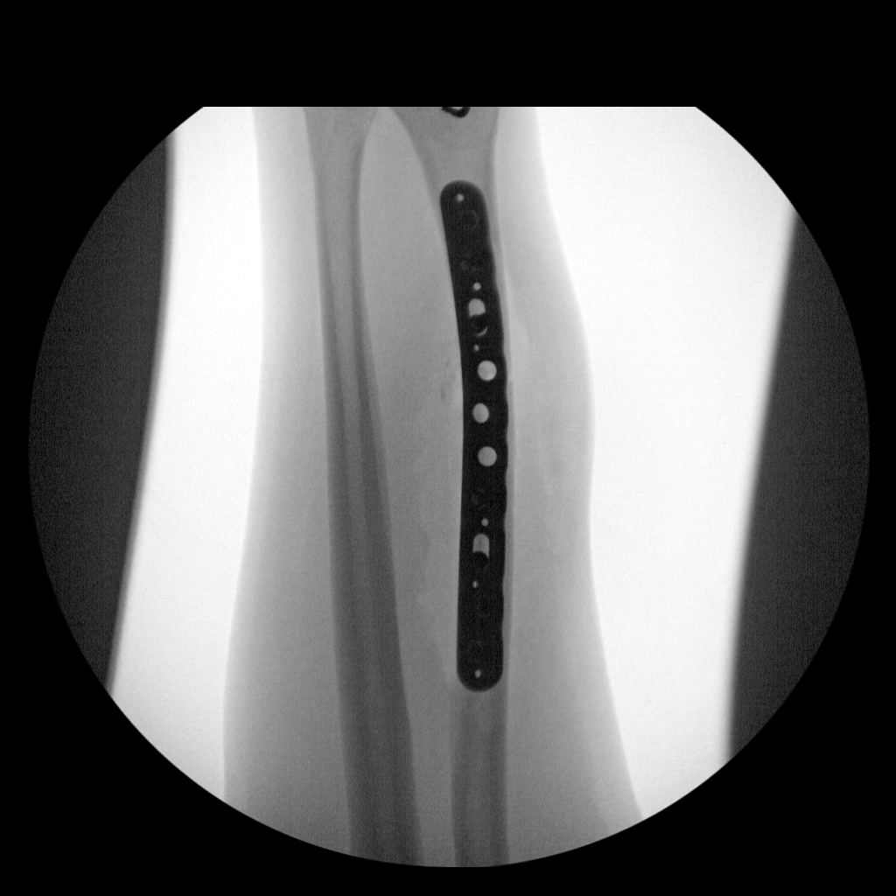
[im 4/6]
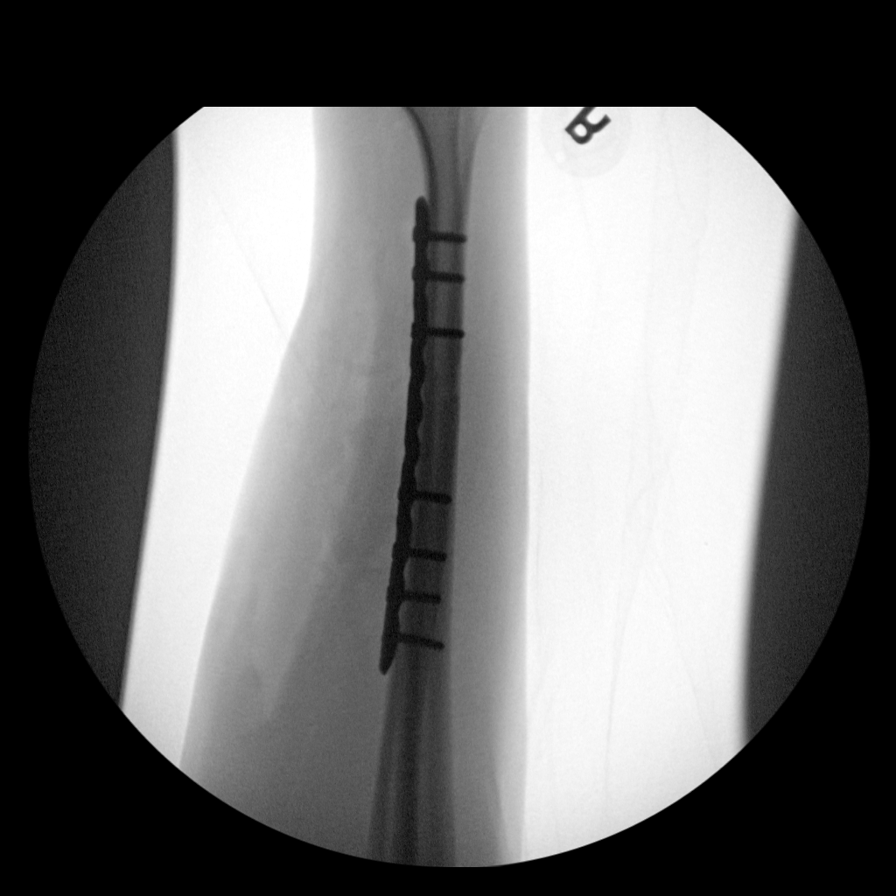
[im 5/6]
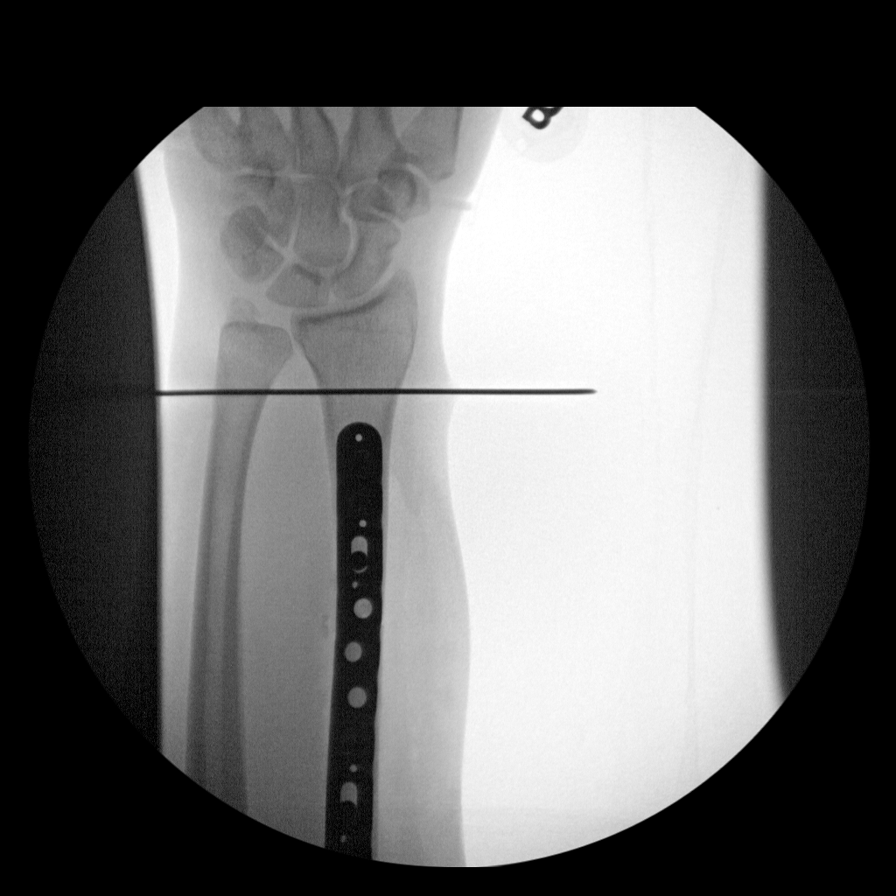
[im 6/6]
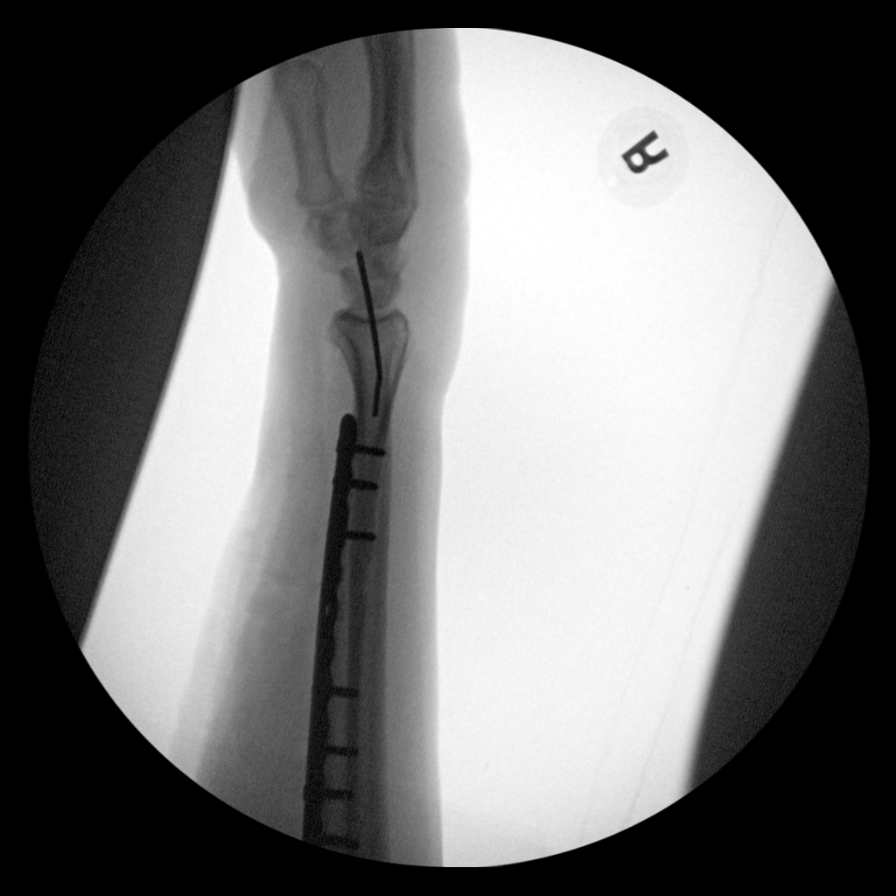

[6 of 6 positions shown; findings below may reference images not displayed]

FINDINGS: Six fluoroscopic images are obtained during the performance of the
procedure and are provided for interpretation only. Images
demonstrate the comminuted radial diaphyseal fracture seen
previously. Plate and screw fixation is seen, with resulting
anatomic alignment. Please refer to operative report.

FLUOROSCOPY TIME:  2 minutes 27 seconds
IMPRESSION: ORIF right radial fracture with anatomic alignment.

## 2021-08-28 SURGERY — PINNING, SACROILIAC JOINT, PERCUTANEOUS
Anesthesia: General | Laterality: Right

## 2021-08-28 MED ORDER — MIDAZOLAM HCL 2 MG/2ML IJ SOLN
INTRAMUSCULAR | Status: AC
  Filled 2021-08-28: qty 2

## 2021-08-28 MED ORDER — PHENYLEPHRINE HCL-NACL 20-0.9 MG/250ML-% IV SOLN
INTRAVENOUS | Status: DC | PRN
  Administered 2021-08-28: 20 ug/min via INTRAVENOUS

## 2021-08-28 MED ORDER — DEXAMETHASONE SODIUM PHOSPHATE 10 MG/ML IJ SOLN
INTRAMUSCULAR | Status: AC
  Filled 2021-08-28: qty 1

## 2021-08-28 MED ORDER — PROPOFOL 10 MG/ML IV BOLUS
INTRAVENOUS | Status: AC
  Filled 2021-08-28: qty 20

## 2021-08-28 MED ORDER — OXYCODONE HCL 5 MG/5ML PO SOLN: 5.0000 mg | Freq: Once | ORAL | Status: DC | PRN

## 2021-08-28 MED ORDER — ONDANSETRON HCL 4 MG/2ML IJ SOLN
INTRAMUSCULAR | Status: AC
  Filled 2021-08-28: qty 2

## 2021-08-28 MED ORDER — ACETAMINOPHEN 10 MG/ML IV SOLN
INTRAVENOUS | Status: DC | PRN
  Administered 2021-08-28: 1000 mg via INTRAVENOUS

## 2021-08-28 MED ORDER — SUGAMMADEX SODIUM 200 MG/2ML IV SOLN
INTRAVENOUS | Status: DC | PRN
  Administered 2021-08-28: 200 mg via INTRAVENOUS

## 2021-08-28 MED ORDER — OXYCODONE HCL 5 MG PO TABS: 5.0000 mg | ORAL_TABLET | Freq: Once | ORAL | Status: DC | PRN

## 2021-08-28 MED ORDER — PHENYLEPHRINE 40 MCG/ML (10ML) SYRINGE FOR IV PUSH (FOR BLOOD PRESSURE SUPPORT)
PREFILLED_SYRINGE | INTRAVENOUS | Status: DC | PRN
  Administered 2021-08-28: 40 ug via INTRAVENOUS

## 2021-08-28 MED ORDER — PHENYLEPHRINE 40 MCG/ML (10ML) SYRINGE FOR IV PUSH (FOR BLOOD PRESSURE SUPPORT)
PREFILLED_SYRINGE | INTRAVENOUS | Status: AC
  Filled 2021-08-28: qty 10

## 2021-08-28 MED ORDER — FENTANYL CITRATE (PF) 250 MCG/5ML IJ SOLN
INTRAMUSCULAR | Status: DC | PRN
  Administered 2021-08-28 (×5): 50 ug via INTRAVENOUS

## 2021-08-28 MED ORDER — ACETAMINOPHEN 500 MG PO TABS: 1000.0000 mg | ORAL_TABLET | Freq: Once | ORAL | Status: DC | PRN

## 2021-08-28 MED ORDER — FENTANYL CITRATE (PF) 100 MCG/2ML IJ SOLN: 25.0000 ug | INTRAMUSCULAR | Status: DC | PRN

## 2021-08-28 MED ORDER — ROCURONIUM BROMIDE 10 MG/ML (PF) SYRINGE
PREFILLED_SYRINGE | INTRAVENOUS | Status: DC | PRN
  Administered 2021-08-28: 30 mg via INTRAVENOUS
  Administered 2021-08-28: 80 mg via INTRAVENOUS
  Administered 2021-08-28: 40 mg via INTRAVENOUS
  Administered 2021-08-28: 50 mg via INTRAVENOUS

## 2021-08-28 MED ORDER — DEXAMETHASONE SODIUM PHOSPHATE 10 MG/ML IJ SOLN
INTRAMUSCULAR | Status: DC | PRN
  Administered 2021-08-28: 10 mg via INTRAVENOUS

## 2021-08-28 MED ORDER — LACTATED RINGERS IV SOLN: INTRAVENOUS | Status: DC

## 2021-08-28 MED ORDER — ACETAMINOPHEN 160 MG/5ML PO SOLN: 1000.0000 mg | Freq: Once | ORAL | Status: DC | PRN

## 2021-08-28 MED ORDER — ALBUMIN HUMAN 5 % IV SOLN: INTRAVENOUS | Status: DC | PRN

## 2021-08-28 MED ORDER — PROPOFOL 10 MG/ML IV BOLUS
INTRAVENOUS | Status: DC | PRN
  Administered 2021-08-28: 40 mg via INTRAVENOUS
  Administered 2021-08-28: 90 mg via INTRAVENOUS

## 2021-08-28 MED ORDER — LACTATED RINGERS IV SOLN: INTRAVENOUS | Status: DC | PRN

## 2021-08-28 MED ORDER — ROCURONIUM BROMIDE 10 MG/ML (PF) SYRINGE
PREFILLED_SYRINGE | INTRAVENOUS | Status: AC
  Filled 2021-08-28: qty 30

## 2021-08-28 MED ORDER — ACETAMINOPHEN 10 MG/ML IV SOLN: 1000.0000 mg | Freq: Once | INTRAVENOUS | Status: DC | PRN

## 2021-08-28 MED ORDER — ACETAMINOPHEN 10 MG/ML IV SOLN
INTRAVENOUS | Status: AC
  Filled 2021-08-28: qty 100

## 2021-08-28 MED ORDER — ONDANSETRON HCL 4 MG/2ML IJ SOLN
INTRAMUSCULAR | Status: DC | PRN
  Administered 2021-08-28: 4 mg via INTRAVENOUS

## 2021-08-28 MED ORDER — 0.9 % SODIUM CHLORIDE (POUR BTL) OPTIME
TOPICAL | Status: DC | PRN
  Administered 2021-08-28: 1000 mL

## 2021-08-28 MED ORDER — CEFAZOLIN SODIUM-DEXTROSE 2-3 GM-%(50ML) IV SOLR
INTRAVENOUS | Status: DC | PRN
  Administered 2021-08-28: 2 g via INTRAVENOUS

## 2021-08-28 MED ORDER — CEFAZOLIN SODIUM-DEXTROSE 2-4 GM/100ML-% IV SOLN
2.0000 g | Freq: Three times a day (TID) | INTRAVENOUS | Status: AC
  Administered 2021-08-28 – 2021-08-29 (×3): 2 g via INTRAVENOUS
  Filled 2021-08-28 (×3): qty 100

## 2021-08-28 MED ORDER — LIDOCAINE 2% (20 MG/ML) 5 ML SYRINGE
INTRAMUSCULAR | Status: AC
  Filled 2021-08-28: qty 5

## 2021-08-28 MED ORDER — FENTANYL CITRATE (PF) 250 MCG/5ML IJ SOLN
INTRAMUSCULAR | Status: AC
  Filled 2021-08-28: qty 5

## 2021-08-28 MED ORDER — CEFAZOLIN SODIUM-DEXTROSE 2-4 GM/100ML-% IV SOLN
INTRAVENOUS | Status: AC
  Filled 2021-08-28: qty 100

## 2021-08-28 SURGICAL SUPPLY — 72 items
BAG COUNTER SPONGE SURGICOUNT (BAG) ×2 IMPLANT
BAG SPNG CNTER NS LX DISP (BAG) ×1
BIT DRILL 4.8X300 (BIT) ×1 IMPLANT
BIT DRILL AO MATTA 2.5MX230M (BIT) IMPLANT
BIT DRILL CANN SURG 2.3X80 (DRILL) IMPLANT
BNDG ELASTIC 2X5.8 VLCR STR LF (GAUZE/BANDAGES/DRESSINGS) ×1 IMPLANT
BNDG ELASTIC 3X5.8 VLCR STR LF (GAUZE/BANDAGES/DRESSINGS) ×1 IMPLANT
BNDG GAUZE ELAST 4 BULKY (GAUZE/BANDAGES/DRESSINGS) ×1 IMPLANT
BRUSH SCRUB EZ PLAIN DRY (MISCELLANEOUS) ×4 IMPLANT
COVER SURGICAL LIGHT HANDLE (MISCELLANEOUS) ×2 IMPLANT
DRAPE C-ARM 42X72 X-RAY (DRAPES) IMPLANT
DRAPE C-ARMOR (DRAPES) ×2 IMPLANT
DRAPE INCISE IOBAN 66X45 STRL (DRAPES) ×2 IMPLANT
DRAPE LAPAROTOMY TRNSV 102X78 (DRAPES) ×2 IMPLANT
DRAPE U-SHAPE 47X51 STRL (DRAPES) ×2 IMPLANT
DRESSING AQUACEL AG SP 3.5X6 (GAUZE/BANDAGES/DRESSINGS) IMPLANT
DRESSING MEPILEX FLEX 4X4 (GAUZE/BANDAGES/DRESSINGS) IMPLANT
DRILL BIT AO MATTA 2.5MX230M (BIT) ×2
DRILL CANN SURG 2.3X80 (DRILL) ×2
DRIVER SURG HEX 2.5X110 (ORTHOPEDIC DISPOSABLE SUPPLIES) ×1 IMPLANT
DRSG AQUACEL AG SP 3.5X6 (GAUZE/BANDAGES/DRESSINGS) ×2
DRSG MEPILEX BORDER 4X4 (GAUZE/BANDAGES/DRESSINGS) ×2 IMPLANT
DRSG MEPILEX FLEX 4X4 (GAUZE/BANDAGES/DRESSINGS) ×2
DRSG MEPITEL 8X12 (GAUZE/BANDAGES/DRESSINGS) ×1 IMPLANT
ELECT REM PT RETURN 9FT ADLT (ELECTROSURGICAL) ×2
ELECTRODE REM PT RTRN 9FT ADLT (ELECTROSURGICAL) ×1 IMPLANT
GAUZE SPONGE 4X4 12PLY STRL (GAUZE/BANDAGES/DRESSINGS) ×1 IMPLANT
GLOVE SRG 8 PF TXTR STRL LF DI (GLOVE) ×1 IMPLANT
GLOVE SURG ENC MOIS LTX SZ8 (GLOVE) ×2 IMPLANT
GLOVE SURG ORTHO LTX SZ7.5 (GLOVE) ×4 IMPLANT
GLOVE SURG UNDER POLY LF SZ7.5 (GLOVE) ×2 IMPLANT
GLOVE SURG UNDER POLY LF SZ8 (GLOVE) ×2
GOWN STRL REUS W/ TWL LRG LVL3 (GOWN DISPOSABLE) ×2 IMPLANT
GOWN STRL REUS W/ TWL XL LVL3 (GOWN DISPOSABLE) ×1 IMPLANT
GOWN STRL REUS W/TWL LRG LVL3 (GOWN DISPOSABLE) ×4
GOWN STRL REUS W/TWL XL LVL3 (GOWN DISPOSABLE) ×2
GUIDE PIN DRILL TIP 2.8X450HIP (PIN) ×4
KIT BASIN OR (CUSTOM PROCEDURE TRAY) ×2 IMPLANT
KIT TURNOVER KIT B (KITS) ×2 IMPLANT
MANIFOLD NEPTUNE II (INSTRUMENTS) ×2 IMPLANT
NS IRRIG 1000ML POUR BTL (IV SOLUTION) ×2 IMPLANT
PACK GENERAL/GYN (CUSTOM PROCEDURE TRAY) ×2 IMPLANT
PAD ABD 8X10 STRL (GAUZE/BANDAGES/DRESSINGS) ×1 IMPLANT
PAD ARMBOARD 7.5X6 YLW CONV (MISCELLANEOUS) ×4 IMPLANT
PADDING CAST ABS 3INX4YD NS (CAST SUPPLIES) ×1
PADDING CAST ABS 4INX4YD NS (CAST SUPPLIES) ×1
PADDING CAST ABS COTTON 3X4 (CAST SUPPLIES) IMPLANT
PADDING CAST ABS COTTON 4X4 ST (CAST SUPPLIES) IMPLANT
PIN GUIDE DRILL TIP 2.8X450HIP (PIN) IMPLANT
PLATE BN 108XRDS CVD 122.5 (Plate) IMPLANT
PLATE CURVED MATTA 122.5M 8H (Plate) ×2 IMPLANT
PLATE LOCK CVD A 10H NS (Plate) ×1 IMPLANT
SCREW CANN 8.0X130X40 (Screw) ×1 IMPLANT
SCREW CANN 8.0X170X40 (Screw) ×1 IMPLANT
SCREW CANN THRD TIMAX 8X180 (Screw) IMPLANT
SCREW CORT 2.5XFT 44X3.5XST (Screw) IMPLANT
SCREW CORTEX ST MATTA 3.5X20 (Screw) ×4 IMPLANT
SCREW CORTEX ST MATTA 3.5X50MM (Screw) ×1 IMPLANT
SCREW CORTICAL 3.5X44MM (Screw) ×4 IMPLANT
SCREW HEX 3.2X12 (Screw) ×6 IMPLANT
SCREW HEX 3.2X14 (Screw) ×1 IMPLANT
SPLINT PLASTER EXTRA FAST 3X15 (CAST SUPPLIES) ×1
SPLINT PLASTER GYPS XFAST 3X15 (CAST SUPPLIES) IMPLANT
STAPLER VISISTAT 35W (STAPLE) ×2 IMPLANT
SUT ETHILON 2 0 FS 18 (SUTURE) ×2 IMPLANT
SUT VIC AB 2-0 FS1 27 (SUTURE) ×2 IMPLANT
TOWEL GREEN STERILE (TOWEL DISPOSABLE) ×4 IMPLANT
TOWEL GREEN STERILE FF (TOWEL DISPOSABLE) ×2 IMPLANT
UNDERPAD 30X36 HEAVY ABSORB (UNDERPADS AND DIAPERS) ×2 IMPLANT
WASHER 8.0 (Orthopedic Implant) ×2 IMPLANT
WASHER CANN FLAT 8 (Orthopedic Implant) IMPLANT
WATER STERILE IRR 1000ML POUR (IV SOLUTION) ×2 IMPLANT

## 2021-08-28 NOTE — Progress Notes (Signed)
PT Cancellation Note  Patient Details Name: Devin Jones MRN: [id number] DOB: 23-Oct-1965   Cancelled Treatment:    Reason Eval/Treat Not Completed: Patient not medically ready. Pt in the OR. PT to return as able, as appropriate s/p surgery once cleared by MD.  [name], PT, DPT Acute Rehabilitation Services Pager #: [phone] Office #: [phone]    Deepika Decatur M Stanely Sexson 08/28/2021, 7:16 AM

## 2021-08-28 NOTE — Anesthesia Procedure Notes (Signed)
Procedure Name: Intubation Date/Time: 08/28/2021 9:48 AM Performed by: Bryson Corona, CRNA Pre-anesthesia Checklist: Patient identified, Emergency Drugs available, Suction available and Patient being monitored Patient Re-evaluated:Patient Re-evaluated prior to induction Oxygen Delivery Method: Circle System Utilized Preoxygenation: Pre-oxygenation with 100% oxygen Induction Type: IV induction Ventilation: Mask ventilation without difficulty and Oral airway inserted - appropriate to patient size Laryngoscope Size: Mac and 3 Grade View: Grade I Tube type: Oral Tube size: 7.0 mm Number of attempts: 1 Airway Equipment and Method: Stylet and Oral airway Placement Confirmation: ETT inserted through vocal cords under direct vision, positive ETCO2 and breath sounds checked- equal and bilateral Secured at: 22 cm Tube secured with: Tape Dental Injury: Teeth and Oropharynx as per pre-operative assessment

## 2021-08-28 NOTE — Progress Notes (Signed)
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  I discussed with the patient the risks and benefits of surgery, including the possibility of infection, nerve injury, vessel injury, wound breakdown, arthritis, symptomatic hardware, DVT/ PE, loss of motion, malunion, nonunion, and need for further surgery among others.  We also specifically discussed the elevated risk of foot drop and right wrist  pain.  He acknowledged these risks and wished to proceed.  Myrene Galas, MD [facility], [facility] [phone]

## 2021-08-28 NOTE — Progress Notes (Signed)
  Transition of Care [facility]) Screening Note   Patient Details  Name: Devin Jones Date of Birth: 1965-12-18   Transition of Care [facility]) CM/SW Contact:    Baldemar Lenis, LCSW Phone Number: 08/28/2021, 3:21 PM    Transition of Care Department [facility]) has reviewed patient and no TOC needs have been identified at this time; medical workup ongoing. We will continue to monitor patient advancement through interdisciplinary progression rounds.

## 2021-08-28 NOTE — Progress Notes (Signed)
Left knee MRI looks reasonable.  Lateral meniscal tear which does not require urgent surgery. Right knee MRI scan shows ACL PCL MCL injury with capsular injury on the medial side as well. Plan is for MCL and capsule repair versus reconstruction with delayed ACL PCL reconstruction in 4 to 6 weeks.  Plan to do the surgery on Thursday on the right knee

## 2021-08-28 NOTE — Anesthesia Procedure Notes (Signed)
Central Venous Catheter Insertion Performed by: Val Eagle, MD, anesthesiologist Start/End12/08/2021 10:04 AM, 08/28/2021 10:14 AM Patient location: OR. Preanesthetic checklist: patient identified, IV checked, site marked, risks and benefits discussed, surgical consent, monitors and equipment checked, pre-op evaluation, timeout performed and anesthesia consent Position: supine Patient sedated Hand hygiene performed  and maximum sterile barriers used  Catheter size: 8 Fr Total catheter length 16. Central line was placed.Double lumen Procedure performed using ultrasound guided technique. Ultrasound Notes:anatomy identified, needle tip was noted to be adjacent to the nerve/plexus identified, no ultrasound evidence of intravascular and/or intraneural injection and image(s) printed for medical record Attempts: 1 Following insertion, dressing applied, line sutured and Biopatch. Post procedure assessment: blood return through all ports and free fluid flow  Patient tolerated the procedure well with no immediate complications.
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
## 2021-08-28 NOTE — Progress Notes (Signed)
Patient ID: Devin Jones, male   DOB: Mar 24, 1966, 54 y.o.   MRN: [id number] Follow up - Trauma Critical Care  Patient Details:    Devin Jones is an 55 y.o. male.  Lines/tubes : Urethral Catheter Callie, RN Temperature probe 16 Fr. (Active)  Indication for Insertion or Continuance of Catheter Bladder outlet obstruction / other urologic reason 08/27/21 2000  Site Assessment Clean;Intact 08/27/21 2000  Catheter Maintenance Bag below level of bladder;Catheter secured;Drainage bag/tubing not touching floor;Insertion date on drainage bag;No dependent loops;Seal intact 08/27/21 2000  Collection Container Standard drainage bag 08/27/21 2000  Securement Method Securing device (Describe) 08/27/21 2000  Urinary Catheter Interventions (if applicable) Unclamped 08/27/21 2000  Output (mL) 2350 mL 08/28/21 0500    Microbiology/Sepsis markers: Results for orders placed or performed during the hospital encounter of 08/25/21  Resp Panel by RT-PCR (Flu A&B, Covid) Nasopharyngeal Swab     Status: None   Collection Time: 08/26/21 12:06 AM   Specimen: Nasopharyngeal Swab; Nasopharyngeal(NP) swabs in vial transport medium  Result Value Ref Range Status   SARS Coronavirus 2 by RT PCR NEGATIVE NEGATIVE Final    Comment: (NOTE) SARS-CoV-2 target nucleic acids are NOT DETECTED.  The SARS-CoV-2 RNA is generally detectable in upper respiratory specimens during the acute phase of infection. The lowest concentration of SARS-CoV-2 viral copies this assay can detect is 138 copies/mL. A negative result does not preclude SARS-Cov-2 infection and should not be used as the sole basis for treatment or other patient management decisions. A negative result may occur with  improper specimen collection/handling, submission of specimen other than nasopharyngeal swab, presence of viral mutation(s) within the areas targeted by this assay, and inadequate number of viral copies(<138 copies/mL). A negative result  must be combined with clinical observations, patient history, and epidemiological information. The expected result is Negative.  Fact Sheet for Patients:  BloggerCourse.com  Fact Sheet for Healthcare Providers:  SeriousBroker.it  This test is no t yet approved or cleared by the [place] FDA and  has been authorized for detection and/or diagnosis of SARS-CoV-2 by FDA under an Emergency Use Authorization (EUA). This EUA will remain  in effect (meaning this test can be used) for the duration of the COVID-19 declaration under Section 564(b)(1) of the Act, 21 U.S.C.section 360bbb-3(b)(1), unless the authorization is terminated  or revoked sooner.       Influenza A by PCR NEGATIVE NEGATIVE Final   Influenza B by PCR NEGATIVE NEGATIVE Final    Comment: (NOTE) The Xpert Xpress SARS-CoV-2/FLU/RSV plus assay is intended as an aid in the diagnosis of influenza from Nasopharyngeal swab specimens and should not be used as a sole basis for treatment. Nasal washings and aspirates are unacceptable for Xpert Xpress SARS-CoV-2/FLU/RSV testing.  Fact Sheet for Patients: BloggerCourse.com  Fact Sheet for Healthcare Providers: SeriousBroker.it  This test is not yet approved or cleared by the [place] FDA and has been authorized for detection and/or diagnosis of SARS-CoV-2 by FDA under an Emergency Use Authorization (EUA). This EUA will remain in effect (meaning this test can be used) for the duration of the COVID-19 declaration under Section 564(b)(1) of the Act, 21 U.S.C. section 360bbb-3(b)(1), unless the authorization is terminated or revoked.  Performed at [facility], [address] [place], [place] [address]   MRSA Next Gen by PCR, Nasal     Status: None   Collection Time: 08/26/21  2:46 AM   Specimen: Nasal Mucosa; Nasal Swab  Result Value Ref Range  Status   MRSA by  PCR Next Gen NOT DETECTED NOT DETECTED Final    Comment: (NOTE) The GeneXpert MRSA Assay (FDA approved for NASAL specimens only), is one component of a comprehensive MRSA colonization surveillance program. It is not intended to diagnose MRSA infection nor to guide or monitor treatment for MRSA infections. Test performance is not FDA approved in patients less than 56 years old. Performed at [facility], [address] [place], [place] [address]     Anti-infectives:  Anti-infectives (From admission, onward)    None      Consults: Treatment Team:  Dawley, Alan Mulderroy C, DO Myrene GalasHandy, Michael, MD    Studies:    Events:  Subjective:    Overnight Issues:  In pre-op holding Objective:  Vital signs for last 24 hours: Temp:  [98.4 F (36.9 C)-99.2 F (37.3 C)] 98.4 F (36.9 C) (12/12 0400) Pulse Rate:  [83-116] 83 (12/12 0600) Resp:  [13-23] 16 (12/12 0600) BP: (111-150)/(68-113) 133/77 (12/12 0600) SpO2:  [85 %-95 %] 94 % (12/12 0600)  Hemodynamic parameters for last 24 hours:    Intake/Output from previous day: 12/11 0701 - 12/12 0700 In: 2244.1 [I.V.:2194.1; IV Piggyback:50] Out: 4200 [Urine:4200]  Intake/Output this shift: No intake/output data recorded.  Vent settings for last 24 hours:    Physical Exam:  General: alert and no respiratory distress Neuro: alert and oriented HEENT/Neck: no JVD Resp: clear to auscultation bilaterally CVS: RRR GI: pelvic binder , soft, NT Extremities: RUE splint, RLE KI  Results for orders placed or performed during the hospital encounter of 08/25/21 (from the past 24 hour(s))  Basic metabolic panel     Status: Abnormal   Collection Time: 08/28/21  5:03 AM  Result Value Ref Range   Sodium 135 135 - 145 mmol/L   Potassium 4.4 3.5 - 5.1 mmol/L   Chloride 104 98 - 111 mmol/L   CO2 26 22 - 32 mmol/L   Glucose, Bld 121 (H) 70 - 99 mg/dL   BUN 11 6 - 20 mg/dL   Creatinine, Ser 1.910.88 0.61 - 1.24 mg/dL   Calcium 8.2 (L) 8.9  - 10.3 mg/dL   GFR, Estimated >47>60 >82>60 mL/min   Anion gap 5 5 - 15  CBC     Status: Abnormal   Collection Time: 08/28/21  5:03 AM  Result Value Ref Range   WBC 7.7 4.0 - 10.5 K/uL   RBC 3.56 (L) 4.22 - 5.81 MIL/uL   Hemoglobin 11.0 (L) 13.0 - 17.0 g/dL   HCT 95.632.2 (L) 21.339.0 - 08.652.0 %   MCV 90.4 80.0 - 100.0 fL   MCH 30.9 26.0 - 34.0 pg   MCHC 34.2 30.0 - 36.0 g/dL   RDW 57.815.2 46.911.5 - 62.915.5 %   Platelets 89 (L) 150 - 400 K/uL   nRBC 0.0 0.0 - 0.2 %  Lipase, blood     Status: None   Collection Time: 08/28/21  5:03 AM  Result Value Ref Range   Lipase 23 11 - 51 U/L  Hepatic function panel     Status: Abnormal   Collection Time: 08/28/21  5:03 AM  Result Value Ref Range   Total Protein 4.9 (L) 6.5 - 8.1 g/dL   Albumin 2.2 (L) 3.5 - 5.0 g/dL   AST 528126 (H) 15 - 41 U/L   ALT 125 (H) 0 - 44 U/L   Alkaline Phosphatase 62 38 - 126 U/L   Total Bilirubin 0.8 0.3 - 1.2 mg/dL  Bilirubin, Direct 0.1 0.0 - 0.2 mg/dL   Indirect Bilirubin 0.7 0.3 - 0.9 mg/dL    Assessment & Plan: Present on Admission:  Pelvic fracture ([place])    LOS: 2 days   Additional comments:I reviewed the patient's new clinical lab test results. Marland Kitchen PHBC Right knee dislocation - reduced by ER provider, x-rays normal post reduction, per Dr. Marlou Sa possible surgery next week, MRI done L knee injury - MRI done, per Dr. Marlou Sa Right ulna/radial fracture - per Dr. Marlou Sa OR Monday Pelvis fracture left pubic rami, acetabulum and sacral ala - binder on, per Dr. Marlou Sa until Monday then OR with Ortho Trauma L1, L2, L4 and L5 transverse process fractures - pain control Sternal fracture - pain control TBI/small SDH/small subarachnoid hemorrhage - repeat CT head done, Dr. Reatha Armour consulted C spine - neg CT, collar when OOB per Dr. Reatha Armour due to pain Right first rib fracture - pain control, incentive spirometer Right clavicular head and scapula fracture - per Dr. Ardeth Perfect liver laceration/contusion - monitor abdominal exam and HGB,  transaminases elevated as expected Right Kidney Laceration - monitor abdominal exam and HGB, foley for strict I/O and complex pelvic FX, hematuria from kidney injury - monitor for resolution Hemorrhagic shock - blood product resuscitation, improved Acute blood loss anemia Thrombocytopenia - consumptive ETOH intoxication - admit ETOH 299, CIWA, reports he does not drink daily but drinks a lot when he does FEN - IVF, reg, NPO at [facility] VTE - No LMWH yet as PLTs<100k and OR today ID - Ancef and Tdap Booster given in the trauma bay.   Dispo - ICU, OR this AM with Dr. Marcelino Scot. Lives alone but has sister, brother, and mother in town. Critical Care Total Time*: 32 Minutes  Georganna Skeans, MD, MPH, FACS Trauma & General Surgery Use [email or web address] to contact on call provider  08/28/2021  *Care during the described time interval was provided by me. I have reviewed this patient's available data, including medical history, events of note, physical examination and test results as part of my evaluation.

## 2021-08-28 NOTE — Transfer of Care (Signed)
Immediate Anesthesia Transfer of Care Note  Patient: Devin Jones  Procedure(s) Performed: SACRO-ILIAC SCREW FIXATION WITH  ANTERIOR PELVIC REPAIR (Right) OPEN REDUCTION INTERNAL FIXATION (ORIF) RADIAL FRACTURE (Right)  Patient Location: PACU  Anesthesia Type:General  Level of Consciousness: awake, alert  and oriented  Airway & Oxygen Therapy: Patient Spontanous Breathing and Patient connected to face mask oxygen  Post-op Assessment: Report given to RN and Post -op Vital signs reviewed and stable  Post vital signs: Reviewed and stable  Last Vitals:  Vitals Value Taken Time  BP 140/86 08/28/21 1410  Temp    Pulse 104 08/28/21 1413  Resp 18 08/28/21 1413  SpO2 98 % 08/28/21 1413  Vitals shown include unvalidated device data.  Last Pain:  Vitals:   08/28/21 0400  TempSrc: Oral  PainSc:          Complications: No notable events documented.

## 2021-08-28 NOTE — Anesthesia Preprocedure Evaluation (Signed)
Anesthesia Evaluation  Patient identified by MRN, date of birth, ID band Patient awake    Reviewed: Allergy & Precautions, NPO status , Patient's Chart, lab work & pertinent test results  History of Anesthesia Complications Negative for: history of anesthetic complications  Airway Mallampati: III  TM Distance: >3 FB Neck ROM: Full    Dental  (+) Teeth Intact, Dental Advisory Given,    Pulmonary neg pulmonary ROS,    breath sounds clear to auscultation       Cardiovascular (-) hypertension(-) angina(-) Past MI and (-) CHF  Rhythm:Regular     Neuro/Psych CT of the brain revealed a small traumatic subarachnoid hemorrhage without mass-effect.  There is no other acute finding intracranially. CVA negative psych ROS   GI/Hepatic negative GI ROS, (+)     substance abuse  alcohol use, Lab Results      Component                Value               Date                      ALT                      125 (H)             08/28/2021                AST                      126 (H)             08/28/2021                ALKPHOS                  62                  08/28/2021                BILITOT                  0.8                 08/28/2021              Endo/Other  negative endocrine ROSNo results found for: HGBA1C   Renal/GU Lab Results      Component                Value               Date                      CREATININE               0.88                08/28/2021                Musculoskeletal   Abdominal   Peds  Hematology  (+) Blood dyscrasia, anemia , Lab Results      Component                Value               Date  WBC                      7.7                 08/28/2021                HGB                      11.0 (L)            08/28/2021                HCT                      32.2 (L)            08/28/2021                MCV                      90.4                08/28/2021                 PLT                      89 (L)              08/28/2021           thrombocytopenia   Anesthesia Other Findings Right knee dislocation- reduced by ER provider, x-rays normal post reduction, per Dr. August Saucer possible surgery next week, MRI done L knee injury - MRI done, per Dr. August Saucer Rightulna/radial fracture- per Dr. August Saucer OR Monday Pelvis fractureleft pubic rami, acetabulum and sacral ala- binder on, per Dr. August Saucer until Monday then OR with Ortho Trauma L1, L2, L4 and L5 transverse process fractures- pain control Sternal fracture- pain control TBI/small SDH/small subarachnoid hemorrhage- repeat CT head done, Dr. Jake Samples consulted C spine - Dr. Jake Samples rec collar - will discuss with him Right first rib fracture- pain control, incentive spirometer Right clavicular head and scapula fracture- per Dr. Rebekah [place] liver laceration/contusion- monitor abdominal exam and HGB, transaminases elevated as expected RightKidneyLaceration- monitor abdominal exam and HGB, foley for strict I/O and complex pelvic FX, hematuria from kidney injury - monitor for resolution Hemorrhagic shock - blood product resuscitation, improved Acute blood loss anemia Thrombocytopenia - consumptive ETOH intoxication - admit ETOH 299, CIWA, reports he does not drink daily but drinks a lot when he does  Reproductive/Obstetrics                             Anesthesia Physical Anesthesia Plan  ASA: 3  Anesthesia Plan: General   Post-op Pain Management: Gabapentin PO (pre-op) and Ofirmev IV (intra-op)   Induction:   PONV Risk Score and Plan: 2 and Ondansetron and Dexamethasone  Airway Management Planned: Oral ETT  Additional Equipment:   Intra-op Plan:   Post-operative Plan: Possible Post-op intubation/ventilation  Informed Consent: I have reviewed the patients History and Physical, chart, labs and discussed the procedure including the risks, benefits and alternatives for the proposed  anesthesia with the patient or authorized representative who has indicated his/her understanding and acceptance.     Dental advisory given  Plan Discussed with: CRNA and Anesthesiologist  Anesthesia Plan Comments: (Possible a line, Possible cvl)  Anesthesia Quick Evaluation  

## 2021-08-28 NOTE — Progress Notes (Signed)
OT Cancellation Note  Patient Details Name: Devin Jones MRN: [id number] DOB: Nov 22, 1965   Cancelled Treatment:    Reason Eval/Treat Not Completed: Patient at procedure or test/ unavailable (Off the floor at surgery. Will return as schedule allows.)  Devin Jones Devin Jones Devin Jones MSOT, OTR/L Acute Rehab Pager: [phone] Office: [phone] 08/28/2021, 7:16 AM

## 2021-08-29 ENCOUNTER — Encounter (HOSPITAL_COMMUNITY): Payer: Self-pay | Admitting: Orthopedic Surgery

## 2021-08-29 LAB — TYPE AND SCREEN
ABO/RH(D): O POS
Antibody Screen: NEGATIVE
Unit division: 0
Unit division: 0
Unit division: 0
Unit division: 0
Unit division: 0
Unit division: 0

## 2021-08-29 LAB — BASIC METABOLIC PANEL
Anion gap: 6 (ref 5–15)
BUN: 11 mg/dL (ref 6–20)
CO2: 26 mmol/L (ref 22–32)
Calcium: 8 mg/dL — ABNORMAL LOW (ref 8.9–10.3)
Chloride: 104 mmol/L (ref 98–111)
Creatinine, Ser: 0.8 mg/dL (ref 0.61–1.24)
GFR, Estimated: >60 mL/min (ref >60)
Glucose, Bld: 142 mg/dL — ABNORMAL HIGH (ref 70–99)
Potassium: 4 mmol/L (ref 3.5–5.1)
Sodium: 136 mmol/L (ref 135–145)

## 2021-08-29 LAB — BPAM RBC
Blood Product Expiration Date: 202212262359
Blood Product Expiration Date: 202301022359
Blood Product Expiration Date: 202301022359
Blood Product Expiration Date: 202301032359
Blood Product Expiration Date: 202301032359
Blood Product Expiration Date: 202301032359
ISSUE DATE / TIME: 202212100006
ISSUE DATE / TIME: 202212100045
ISSUE DATE / TIME: 202212100125
ISSUE DATE / TIME: 202212100125
ISSUE DATE / TIME: 202212120721
Unit Type and Rh: 5100
Unit Type and Rh: 5100
Unit Type and Rh: 5100
Unit Type and Rh: 5100
Unit Type and Rh: 5100
Unit Type and Rh: 5100

## 2021-08-29 LAB — HEPATIC FUNCTION PANEL
ALT: 80 U/L — ABNORMAL HIGH (ref 0–44)
AST: 69 U/L — ABNORMAL HIGH (ref 15–41)
Albumin: 2.3 g/dL — ABNORMAL LOW (ref 3.5–5.0)
Alkaline Phosphatase: 57 U/L (ref 38–126)
Bilirubin, Direct: 0.2 mg/dL (ref 0.0–0.2)
Indirect Bilirubin: 0.5 mg/dL (ref 0.3–0.9)
Total Bilirubin: 0.7 mg/dL (ref 0.3–1.2)
Total Protein: 4.9 g/dL — ABNORMAL LOW (ref 6.5–8.1)

## 2021-08-29 LAB — CBC
HCT: 27.1 % — ABNORMAL LOW (ref 39.0–52.0)
Hemoglobin: 9 g/dL — ABNORMAL LOW (ref 13.0–17.0)
MCH: 30.6 pg (ref 26.0–34.0)
MCHC: 33.2 g/dL (ref 30.0–36.0)
MCV: 92.2 fL (ref 80.0–100.0)
Platelets: 92 10*3/uL — ABNORMAL LOW (ref 150–400)
RBC: 2.94 MIL/uL — ABNORMAL LOW (ref 4.22–5.81)
RDW: 14.9 % (ref 11.5–15.5)
WBC: 7.7 10*3/uL (ref 4.0–10.5)
nRBC: 0 % (ref 0.0–0.2)

## 2021-08-29 LAB — LIPASE, BLOOD: Lipase: 27 U/L (ref 11–51)

## 2021-08-29 LAB — VITAMIN D 25 HYDROXY (VIT D DEFICIENCY, FRACTURES): Vit D, 25-Hydroxy: 17.71 ng/mL — ABNORMAL LOW (ref 30–100)

## 2021-08-29 MED ORDER — VITAMIN D 25 MCG (1000 UNIT) PO TABS
2000.0000 [IU] | ORAL_TABLET | Freq: Two times a day (BID) | ORAL | Status: DC
  Administered 2021-08-29 – 2021-09-07 (×18): 2000 [IU] via ORAL
  Filled 2021-08-29 (×18): qty 2

## 2021-08-29 MED ORDER — ASCORBIC ACID 500 MG PO TABS
500.0000 mg | ORAL_TABLET | Freq: Every day | ORAL | Status: DC
  Administered 2021-08-29 – 2021-09-07 (×9): 500 mg via ORAL
  Filled 2021-08-29 (×9): qty 1

## 2021-08-29 MED ORDER — POLYETHYLENE GLYCOL 3350 17 G PO PACK
17.0000 g | PACK | Freq: Every day | ORAL | Status: DC
  Administered 2021-08-29: 17 g via ORAL
  Filled 2021-08-29: qty 1

## 2021-08-29 MED ORDER — MENTHOL 3 MG MT LOZG
1.0000 | LOZENGE | OROMUCOSAL | Status: DC | PRN
  Filled 2021-08-29: qty 9

## 2021-08-29 NOTE — Progress Notes (Signed)
Patient ID: Devin Jones, male   DOB: June 05, 1966, 55 y.o.   MRN: [id number] 1 Day Post-Op   Subjective: C/O a mild cough, has not had BM ROS negative except as listed above. Objective: Vital signs in last 24 hours: Temp:  [97.8 F (36.6 C)-98.4 F (36.9 C)] 98.1 F (36.7 C) (12/13 0800) Pulse Rate:  [64-102] 72 (12/13 0800) Resp:  [10-19] 19 (12/13 0000) BP: (106-141)/(63-91) 121/77 (12/13 0800) SpO2:  [92 %-100 %] 100 % (12/13 0800) Last BM Date:  (pta)  Intake/Output from previous day: 12/12 0701 - 12/13 0700 In: 4280.4 [I.V.:3480.4; IV Piggyback:800] Out: 3840 [Urine:3690; Blood:150] Intake/Output this shift: No intake/output data recorded.  General appearance: alert and cooperative Resp: clear after cough, 1500cc on IS Cardio: regular rate and rhythm GI: soft, NT, ND, ortho dressing Extremities: RLE wrist splint, RLE KI, LLE contuisons Neurologic: Mental status: Alert, oriented, thought content appropriate  Lab Results: CBC  Recent Labs    08/28/21 0503 08/28/21 1132 08/28/21 1339 08/29/21 0500  WBC 7.7  --   --  7.7  HGB 11.0*   < > 9.2* 9.0*  HCT 32.2*   < > 27.0* 27.1*  PLT 89*  --   --  92*   < > = values in this interval not displayed.   BMET Recent Labs    08/28/21 0503 08/28/21 1132 08/28/21 1339 08/29/21 0500  NA 135   < > 136 136  K 4.4   < > 4.7 4.0  CL 104  --   --  104  CO2 26  --   --  26  GLUCOSE 121*  --   --  142*  BUN 11  --   --  11  CREATININE 0.88  --   --  0.80  CALCIUM 8.2*  --   --  8.0*   < > = values in this interval not displayed.   PT/INR No results for input(s): LABPROT, INR in the last 72 hours. ABG Recent Labs    08/28/21 1132 08/28/21 1339  HCO3 25.5 26.1    Studies/Results:   Anti-infectives: Anti-infectives (From admission, onward)    Start     Dose/Rate Route Frequency Ordered Stop   08/28/21 1800  ceFAZolin (ANCEF) IVPB 2g/100 mL premix        2 g 200 mL/hr over 30 Minutes Intravenous Every 8  hours 08/28/21 1540 08/29/21 1759   08/28/21 0918  ceFAZolin (ANCEF) 2-4 GM/100ML-% IVPB       Note to Pharmacy: Oswaldo Milian H: cabinet override      08/28/21 0918 08/28/21 1557       Assessment/Plan: PHBC Right knee dislocation - reduced by ER provider, x-rays normal post reduction, per Dr. August Saucer, MRI shows ACL, PCL, MCL injuries and medial capsule injury. OR Thursday with Dr. August Saucer. L knee injury - MRI done, per Dr. August Saucer - lateral meniscal tear, no urgent surgery Right ulna/radial fracture - S/P ORIF by Dr. Carola Frost 12/12 Pelvis fracture left pubic rami, acetabulum and sacral ala - S/P SI screw x 2 and anterior plating by Dr. Carola Frost 12/12, NWB BLE L1, L2, L4 and L5 transverse process fractures - pain control Sternal fracture - pain control TBI/small SDH/small subarachnoid hemorrhage - repeat CT head done, Dr. Jake Samples consulted C spine - neg CT, collar when OOB per Dr. Jake Samples due to pain Right first rib fracture - pain control, incentive spirometer Right clavicular head and scapula fracture - per Dr. Rebekah Chester[place] liver laceration/contusion -  monitor abdominal exam and HGB, transaminases elevated as expected Right Kidney Laceration - monitor abdominal exam and HGB, foley for strict I/O and complex pelvic FX, hematuria from kidney injury has improved Acute blood loss anemia Thrombocytopenia - consumptive, improving ETOH intoxication - admit ETOH 299, CIWA, reports he does not drink daily but drinks a lot when he does FEN - IVF, reg diet, bowel regimen VTE - No LMWH yet as PLTs<100k  ID - Ancef and Tdap Booster given in the trauma bay.   Dispo - to [facility], PT/OT/ST  LOS: 3 days    Georganna Skeans, MD, MPH, FACS Trauma & General Surgery Use [email or web address] to contact on call provider  08/29/2021
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
## 2021-08-29 NOTE — TOC Progression Note (Signed)
Transition of Care [facility]) - Progression Note    Patient Details  Name: Devin Jones MRN: [id number] Date of Birth: [birth year]  Transition of Care [facility]) CM/SW [place], [place] Phone Number: 08/29/2021, 1:48 PM  Clinical Narrative:     CSW responded to substance use consult; met with pt bedside, pt's brother is present. Pt states he drinks alcohol 2-4 times a week. He reports when he drinks, he will have up to 8 high percentage beers along with about a double shot of liquor. He estimates he drinks this amount on maybe 2 days/week and the other 2 days he drinks less. He reports he has been drinking in this manner since college. Pt denies any other substance use. He reports he went to an inpatient treatment center for 10 days years ago in order to fulfill legal requirements for a DUI. Pt is interested in maintaining abstinence from alcohol as he does not believe he is able to manage drinking alcohol at all. CSW discusses various levels of care as well as ober living and mutual support groups. CSW answers pt and pt brother's questions regarding various levels of care. Pt expresses some experience with SMART recovery; local resources are limited but there are virtual meetings available. CSW provides list of virtual SMART recovery meetings. Pt and brother are appreciative.
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
## 2021-08-29 NOTE — Anesthesia Postprocedure Evaluation (Addendum)
Anesthesia Post Note  Patient: Devin Jones  Procedure(s) Performed: SACRO-ILIAC SCREW FIXATION WITH  ANTERIOR PELVIC REPAIR (Right) OPEN REDUCTION INTERNAL FIXATION (ORIF) RADIAL FRACTURE (Right)     Patient location during evaluation: ICU Anesthesia Type: General Level of consciousness: sedated Pain management: pain level controlled Vital Signs Assessment: post-procedure vital signs reviewed and stable Respiratory status: respiratory function stable and patient remains intubated per anesthesia plan Cardiovascular status: blood pressure returned to baseline and stable Postop Assessment: no apparent nausea or vomiting Anesthetic complications: no   No notable events documented.  Last Vitals:  Vitals:   08/29/21 0900 08/29/21 1200  BP: 135/82   Pulse: 88   Resp:    Temp:  36.6 C  SpO2: 96%     Last Pain:  Vitals:   08/29/21 1200  TempSrc: Oral  PainSc:                  Devin Jones

## 2021-08-29 NOTE — Progress Notes (Signed)
Pt moved into 4NP12 from 4NICU. Pt with 2 bags of belongings. Pt A&O x4 states pain level 0/10. Pt educated on call light and left within reach. Full assessment and vital signs documented.

## 2021-08-29 NOTE — Progress Notes (Signed)
Per MD order double lumen IJ removed from right side of neck. Catheter intact. Pressure held for 5 mins with vaseline gauze no signs of bleeding noted. Vaseline gauze and transparent dressing placed. Pt tolerated well. Educated pt on s/s of bleeding and when to notify nurse verbalized understanding.

## 2021-08-29 NOTE — Progress Notes (Signed)
°  Subjective: Patient seen and evaluated.  Reports his knee pain is controlled and states that the knee immobilizer has helped his right knee.  Denies any numbness or tingling sensation throughout the legs.  Does not feel any knee instability laying in bed.  Denies any coolness to his foot.  No chest pain, shortness of breath, calf pain, abdominal pain.  Objective: Vital signs in last 24 hours: Temp:  [97.8 F (36.6 C)-98.4 F (36.9 C)] 97.9 F (36.6 C) (12/13 1200) Pulse Rate:  [64-102] 92 (12/13 1300) Resp:  [10-19] 14 (12/13 1300) BP: (106-150)/(63-96) 142/80 (12/13 1300) SpO2:  [92 %-100 %] 98 % (12/13 1300)  Intake/Output from previous day: 12/12 0701 - 12/13 0700 In: 4280.4 [I.V.:3480.4; IV Piggyback:800] Out: 3840 [Urine:3690; Blood:150] Intake/Output this shift: Total I/O In: 100 [IV Piggyback:100] Out: 1050 [Urine:1050]  Exam:  2+ DP pulse of bilateral lower extremity.  Intact ankle dorsiflexion, plantarflexion.  Sensation intact through dorsum, plantar aspects of bilateral feet as well as in the first webspace bilaterally.  No calf tenderness.  Negative Homans' sign.  Ecchymosis noted to the medial aspect of the right knee but no significant out of proportion swelling to preclude surgery. Labs: Recent Labs    08/27/21 0314 08/28/21 0503 08/28/21 1132 08/28/21 1339 08/29/21 0500  HGB 11.7* 11.0* 9.2* 9.2* 9.0*   Recent Labs    08/28/21 0503 08/28/21 1132 08/28/21 1339 08/29/21 0500  WBC 7.7  --   --  7.7  RBC 3.56*  --   --  2.94*  HCT 32.2*   < > 27.0* 27.1*  PLT 89*  --   --  92*   < > = values in this interval not displayed.   Recent Labs    08/28/21 0503 08/28/21 1132 08/28/21 1339 08/29/21 0500  NA 135   < > 136 136  K 4.4   < > 4.7 4.0  CL 104  --   --  104  CO2 26  --   --  26  BUN 11  --   --  11  CREATININE 0.88  --   --  0.80  GLUCOSE 121*  --   --  142*  CALCIUM 8.2*  --   --  8.0*   < > = values in this interval not displayed.   No  results for input(s): LABPT, INR in the last 72 hours.  Assessment/Plan: Discussed MRI results with the patient and the current plan for his knee injuries.  No urgent surgery needed for her left knee.  Informed Devyn that we will take him to the operating room on Thursday for right knee MCL/capsular repair versus reconstruction with ACL and PCL reconstruction to follow about 4 to 6 weeks in the future.  Answered his questions to his satisfaction.  Plan to post for surgery on Thursday.   Staci Dack L Danuta Huseman 08/29/2021, 2:17 PM

## 2021-08-29 NOTE — Anesthesia Postprocedure Evaluation (Signed)
Anesthesia Post Note  Patient: Devin Jones  Procedure(s) Performed: SACRO-ILIAC SCREW FIXATION WITH  ANTERIOR PELVIC REPAIR (Right) OPEN REDUCTION INTERNAL FIXATION (ORIF) RADIAL FRACTURE (Right)     Patient location during evaluation: PACU Anesthesia Type: General Level of consciousness: awake and alert Pain management: pain level controlled Vital Signs Assessment: post-procedure vital signs reviewed and stable Respiratory status: spontaneous breathing, nonlabored ventilation, respiratory function stable and patient connected to nasal cannula oxygen Cardiovascular status: blood pressure returned to baseline and stable Postop Assessment: no apparent nausea or vomiting Anesthetic complications: no   No notable events documented.  Last Vitals:  Vitals:   08/29/21 0900 08/29/21 1200  BP: 135/82   Pulse: 88   Resp:    Temp:  36.6 C  SpO2: 96%     Last Pain:  Vitals:   08/29/21 1200  TempSrc: Oral  PainSc:                  Wendy Mikles
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
## 2021-08-30 ENCOUNTER — Encounter (HOSPITAL_COMMUNITY): Payer: Self-pay | Admitting: Orthopedic Surgery

## 2021-08-30 LAB — BASIC METABOLIC PANEL
Anion gap: 7 (ref 5–15)
BUN: 16 mg/dL (ref 6–20)
CO2: 28 mmol/L (ref 22–32)
Calcium: 8.4 mg/dL — ABNORMAL LOW (ref 8.9–10.3)
Chloride: 103 mmol/L (ref 98–111)
Creatinine, Ser: 0.84 mg/dL (ref 0.61–1.24)
GFR, Estimated: >60 mL/min (ref >60)
Glucose, Bld: 107 mg/dL — ABNORMAL HIGH (ref 70–99)
Potassium: 4 mmol/L (ref 3.5–5.1)
Sodium: 138 mmol/L (ref 135–145)

## 2021-08-30 LAB — HEPATIC FUNCTION PANEL
ALT: 89 U/L — ABNORMAL HIGH (ref 0–44)
AST: 79 U/L — ABNORMAL HIGH (ref 15–41)
Albumin: 2.2 g/dL — ABNORMAL LOW (ref 3.5–5.0)
Alkaline Phosphatase: 120 U/L (ref 38–126)
Bilirubin, Direct: 0.2 mg/dL (ref 0.0–0.2)
Indirect Bilirubin: 0.8 mg/dL (ref 0.3–0.9)
Total Bilirubin: 1 mg/dL (ref 0.3–1.2)
Total Protein: 5.2 g/dL — ABNORMAL LOW (ref 6.5–8.1)

## 2021-08-30 LAB — CBC
HCT: 30.2 % — ABNORMAL LOW (ref 39.0–52.0)
Hemoglobin: 10 g/dL — ABNORMAL LOW (ref 13.0–17.0)
MCH: 30.5 pg (ref 26.0–34.0)
MCHC: 33.1 g/dL (ref 30.0–36.0)
MCV: 92.1 fL (ref 80.0–100.0)
Platelets: 117 10*3/uL — ABNORMAL LOW (ref 150–400)
RBC: 3.28 MIL/uL — ABNORMAL LOW (ref 4.22–5.81)
RDW: 14.6 % (ref 11.5–15.5)
WBC: 8.1 10*3/uL (ref 4.0–10.5)
nRBC: 0.4 % — ABNORMAL HIGH (ref 0.0–0.2)

## 2021-08-30 MED ORDER — POVIDONE-IODINE 10 % EX SWAB
2.0000 "application " | Freq: Once | CUTANEOUS | Status: AC
  Administered 2021-08-31: 2 via TOPICAL

## 2021-08-30 MED ORDER — CHLORHEXIDINE GLUCONATE 4 % EX LIQD
60.0000 mL | Freq: Once | CUTANEOUS | Status: AC
  Administered 2021-08-31: 4 via TOPICAL
  Filled 2021-08-30: qty 60

## 2021-08-30 MED ORDER — CEFAZOLIN SODIUM-DEXTROSE 2-4 GM/100ML-% IV SOLN
2.0000 g | INTRAVENOUS | Status: AC
  Administered 2021-08-31: 2 g via INTRAVENOUS
  Filled 2021-08-30: qty 100

## 2021-08-30 MED ORDER — ENOXAPARIN SODIUM 30 MG/0.3ML IJ SOSY
30.0000 mg | PREFILLED_SYRINGE | Freq: Two times a day (BID) | INTRAMUSCULAR | Status: DC
  Administered 2021-08-30: 10:00:00 30 mg via SUBCUTANEOUS
  Filled 2021-08-30: qty 0.3

## 2021-08-30 MED ORDER — POLYETHYLENE GLYCOL 3350 17 G PO PACK
17.0000 g | PACK | Freq: Two times a day (BID) | ORAL | Status: DC
  Administered 2021-08-30 – 2021-09-07 (×13): 17 g via ORAL
  Filled 2021-08-30 (×14): qty 1

## 2021-08-30 MED ORDER — LORAZEPAM 2 MG/ML IJ SOLN
0.0000 mg | Freq: Two times a day (BID) | INTRAMUSCULAR | Status: AC
  Administered 2021-08-30: 21:00:00 2 mg via INTRAVENOUS
  Filled 2021-08-30: qty 1

## 2021-08-30 MED ORDER — BISACODYL 10 MG RE SUPP
10.0000 mg | Freq: Every day | RECTAL | Status: DC | PRN
  Administered 2021-09-06: 09:00:00 10 mg via RECTAL
  Filled 2021-08-30: qty 1

## 2021-08-30 NOTE — Progress Notes (Signed)
This RN attempted to have pt sign consent for surgery. Pt request more education from MD on surgery before signing consent. Unsigned consent placed in chart.

## 2021-08-30 NOTE — Progress Notes (Signed)
Patient ID: Devin Jones, male   DOB: 1966/01/21, 55 y.o.   MRN: [id number] [facility] Surgery Progress Note  2 Days Post-Op  Subjective: CC-  Pain well controlled. Tolerating diet, no n/v. Passing flatus, no BM since admission. Worked with therapies yesterday who recommend SNF.  Objective: Vital signs in last 24 hours: Temp:  [97.7 F (36.5 C)-98.8 F (37.1 C)] 98.8 F (37.1 C) (12/14 0741) Pulse Rate:  [83-102] 86 (12/14 0741) Resp:  [12-19] 14 (12/14 0741) BP: (132-150)/(77-98) 137/83 (12/14 0741) SpO2:  [95 %-100 %] 98 % (12/14 0741) Last BM Date:  (PTA)  Intake/Output from previous day: 12/13 0701 - 12/14 0700 In: 1020 [I.V.:920; IV Piggyback:100] Out: 1650 [Urine:1650] Intake/Output this shift: No intake/output data recorded.  PE: Gen:  Alert, NAD, pleasant HEENT: EOM's intact, pupils chronically unequal in size. C-spine nontender, no neck pain with active ROM Card:  RRR, no M/G/R heard, palpable pedal pulses bilaterally Pulm:  CTAB, no W/R/R, rate and effort normal on 2L [place] Abd: Soft, NT/ND Ext:  KI to RLE, SCD on LLE. Splint to RUE. Edema noted to right hand Psych: A&Ox4  GU: foley with clear yellow urine Skin: no rashes noted, warm and dry  Lab Results:  Recent Labs    08/29/21 0500 08/30/21 0530  WBC 7.7 8.1  HGB 9.0* 10.0*  HCT 27.1* 30.2*  PLT 92* 117*   BMET Recent Labs    08/29/21 0500 08/30/21 0530  NA 136 138  K 4.0 4.0  CL 104 103  CO2 26 28  GLUCOSE 142* 107*  BUN 11 16  CREATININE 0.80 0.84  CALCIUM 8.0* 8.4*   PT/INR No results for input(s): LABPROT, INR in the last 72 hours. CMP     Component Value Date/Time   NA 138 08/30/2021 0530   K 4.0 08/30/2021 0530   CL 103 08/30/2021 0530   CO2 28 08/30/2021 0530   GLUCOSE 107 (H) 08/30/2021 0530   BUN 16 08/30/2021 0530   CREATININE 0.84 08/30/2021 0530   CALCIUM 8.4 (L) 08/30/2021 0530   PROT 5.2 (L) 08/30/2021 0530   ALBUMIN 2.2 (L) 08/30/2021 0530   AST 79 (H)  08/30/2021 0530   ALT 89 (H) 08/30/2021 0530   ALKPHOS 120 08/30/2021 0530   BILITOT 1.0 08/30/2021 0530   GFRNONAA >60 08/30/2021 0530   Lipase     Component Value Date/Time   LIPASE 27 08/29/2021 0500       Studies/Results: DG Elbow 2 Views Right  Result Date: 08/28/2021 CLINICAL DATA:  Fracture, postop EXAM: RIGHT ELBOW - 2 VIEW COMPARISON:  None. FINDINGS: Plate and screw fixation across the midshaft radius fracture with anatomic alignment. Fracture of the olecranon again noted with minimal displacement, unchanged. Probable joint effusion. IMPRESSION: Stable appearance of the fracture through the olecranon. Electronically Signed   By: Rolm Baptise M.D.   On: 08/28/2021 19:31   DG Forearm Right  Result Date: 08/28/2021 CLINICAL DATA:  Postop EXAM: RIGHT FOREARM - 2 VIEW COMPARISON:  08/25/2021 FINDINGS: Plate and screw fixation across the mid shaft radial fracture. Wire placed across the distal radius and ulna. Anatomic alignment. IMPRESSION: Internal fixation.  No visible complicating feature. Electronically Signed   By: Rolm Baptise M.D.   On: 08/28/2021 19:30   DG Forearm Right  Result Date: 08/28/2021 CLINICAL DATA:  Fracture apparent EXAM: RIGHT FOREARM - 2 VIEW COMPARISON:  08/25/2021 FINDINGS: Six fluoroscopic images are obtained during the performance of the procedure and are provided for  interpretation only. Images demonstrate the comminuted radial diaphyseal fracture seen previously. Plate and screw fixation is seen, with resulting anatomic alignment. Please refer to operative report. FLUOROSCOPY TIME:  2 minutes 27 seconds IMPRESSION: ORIF right radial fracture with anatomic alignment. Electronically Signed   By: Randa Ngo M.D.   On: 08/28/2021 15:51   DG Wrist 2 Views Right  Result Date: 08/28/2021 CLINICAL DATA:  Fracture, postop EXAM: RIGHT WRIST - 2 VIEW COMPARISON:  None. FINDINGS: Wire placed across the distal radius and ulna. Plate and screw fixation across  the midshaft radial fracture. Ulnar styloid fracture noted. Anatomic alignment. IMPRESSION: Internal fixation as above.  Anatomic alignment. Electronically Signed   By: Rolm Baptise M.D.   On: 08/28/2021 19:32   DG Pelvis Comp Min 3V  Result Date: 08/28/2021 CLINICAL DATA:  Postop imaging.  Fracture. EXAM: JUDET PELVIS - 3+ VIEW COMPARISON:  CT 08/26/2021 FINDINGS: Screw fixation across the sacrum/SI joints. Plate and screw fixation across the pubic symphysis. Displaced inferior and superior pubic rami fractures bilaterally. IMPRESSION: Internal fixation across the sacral fractures and the pubic symphysis. Continued displacement across the inferior and superior pubic rami fractures bilaterally. Electronically Signed   By: Rolm Baptise M.D.   On: 08/28/2021 19:29   DG Pelvis Comp Min 3V  Result Date: 08/28/2021 CLINICAL DATA:  Sacroiliac fusion EXAM: JUDET PELVIS - 3+ VIEW COMPARISON:  08/25/2021 FINDINGS: Six fluoroscopic images are obtained during the performance of the procedure and are provided for interpretation only. Images demonstrate placement of malleable plate and screw fixation across the pubic symphysis, with near anatomic alignment. The right superior and inferior rami fracture seen previously are again noted. Two cannulated screws and washers are also identified traversing the bilateral sacroiliac joints, with near anatomic alignment of the left sacral ala fracture. Please refer to operative report. FLUOROSCOPY TIME:  2 minutes 27 seconds IMPRESSION: 1. ORIF of the pubic symphysis and left sacroiliac fractures as above. Electronically Signed   By: Randa Ngo M.D.   On: 08/28/2021 15:50   DG C-Arm 1-60 Min-No Report  Result Date: 08/28/2021 Fluoroscopy was utilized by the requesting physician.  No radiographic interpretation.   DG C-Arm 1-60 Min-No Report  Result Date: 08/28/2021 Fluoroscopy was utilized by the requesting physician.  No radiographic interpretation.   DG C-Arm  1-60 Min-No Report  Result Date: 08/28/2021 Fluoroscopy was utilized by the requesting physician.  No radiographic interpretation.   DG C-Arm 1-60 Min-No Report  Result Date: 08/28/2021 Fluoroscopy was utilized by the requesting physician.  No radiographic interpretation.    Anti-infectives: Anti-infectives (From admission, onward)    Start     Dose/Rate Route Frequency Ordered Stop   08/28/21 1800  ceFAZolin (ANCEF) IVPB 2g/100 mL premix        2 g 200 mL/hr over 30 Minutes Intravenous Every 8 hours 08/28/21 1540 08/29/21 1104   08/28/21 0918  ceFAZolin (ANCEF) 2-4 GM/100ML-% IVPB       Note to Pharmacy: Elmer Sow H: cabinet override      08/28/21 0918 08/28/21 1557        Assessment/Plan PHBC Right knee dislocation - reduced by ER provider, x-rays normal post reduction, per Dr. Marlou Sa, MRI shows ACL, PCL, MCL injuries and medial capsule injury. OR Thursday with Dr. Marlou Sa. L knee injury - MRI done, per Dr. Marlou Sa - lateral meniscal tear, no urgent surgery Right ulna/radial fracture - S/P ORIF by Dr. Marcelino Scot 12/12. NWB R wrist can WB thru elbow Pelvis fracture left pubic  rami, acetabulum and sacral ala - S/P SI screw x 2 and anterior plating by Dr. Carola Frost 12/12, NWB BLE x8 weeks - bed to chair transfers x8 weeks L1, L2, L4 and L5 transverse process fractures - pain control Sternal fracture - pain control TBI/small SDH/small subarachnoid hemorrhage - repeat CT head done, Dr. Jake Samples consulted, no further repeat imaging needed/ monitor neuro exam C spine - cleared, no neck pain Right first rib fracture - pain control, incentive spirometer Right clavicular head and scapula fracture - per Dr. August Saucer, nonop Small liver laceration/contusion - hgb stable and abdominal exam benign. transaminases elevated as expected but overall downtrending Right Kidney Laceration - monitor abdominal exam and HGB, hematuria from kidney injury has improved Acute blood loss anemia Thrombocytopenia - consumptive,  improving, platelets up to 117 ETOH intoxication - admit ETOH 299, CIWA, reports he does not drink daily but drinks a lot when he does FEN - d/c IVF, reg diet, increase miralax BID VTE - start lovenox 12/14 ID - Ancef and Tdap Booster given in the trauma bay. Foley: voiding trial today 12/14 Dispo - 4NP, PT/OT/ST - currently recommending SNF. Voiding trial today. OR tomorrow with Dr. August Saucer.   LOS: 4 days    Franne Forts, [facility] 08/30/2021, 9:02 AM Please see Amion for pager number during day hours 7:00am-4:30pm
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
## 2021-08-30 NOTE — Progress Notes (Signed)
Per order foley removed at 0945 pt tolerated well. Pt voided 900 ml at 1059 in urinal.

## 2021-08-31 ENCOUNTER — Encounter (HOSPITAL_COMMUNITY): Admission: EM | Disposition: A | Discharge status: Rehabilitation Facility | Payer: Self-pay | Source: Home / Self Care

## 2021-08-31 ENCOUNTER — Encounter (HOSPITAL_COMMUNITY): Payer: Self-pay

## 2021-08-31 ENCOUNTER — Inpatient Hospital Stay (HOSPITAL_COMMUNITY): Payer: Medicaid Other | Admitting: Certified Registered Nurse Anesthetist

## 2021-08-31 DIAGNOSIS — S83104D Unspecified dislocation of right knee, subsequent encounter: Secondary | ICD-10-CM

## 2021-08-31 HISTORY — PX: MEDIAL COLLATERAL LIGAMENT REPAIR, KNEE: SHX2019

## 2021-08-31 LAB — BASIC METABOLIC PANEL
Anion gap: 8 (ref 5–15)
BUN: 21 mg/dL — ABNORMAL HIGH (ref 6–20)
CO2: 23 mmol/L (ref 22–32)
Calcium: 8.2 mg/dL — ABNORMAL LOW (ref 8.9–10.3)
Chloride: 106 mmol/L (ref 98–111)
Creatinine, Ser: 0.99 mg/dL (ref 0.61–1.24)
GFR, Estimated: >60 mL/min (ref >60)
Glucose, Bld: 109 mg/dL — ABNORMAL HIGH (ref 70–99)
Potassium: 4.9 mmol/L (ref 3.5–5.1)
Sodium: 137 mmol/L (ref 135–145)

## 2021-08-31 LAB — CBC
HCT: 30 % — ABNORMAL LOW (ref 39.0–52.0)
Hemoglobin: 10 g/dL — ABNORMAL LOW (ref 13.0–17.0)
MCH: 30.5 pg (ref 26.0–34.0)
MCHC: 33.3 g/dL (ref 30.0–36.0)
MCV: 91.5 fL (ref 80.0–100.0)
Platelets: 135 10*3/uL — ABNORMAL LOW (ref 150–400)
RBC: 3.28 MIL/uL — ABNORMAL LOW (ref 4.22–5.81)
RDW: 14.6 % (ref 11.5–15.5)
WBC: 7.9 10*3/uL (ref 4.0–10.5)
nRBC: 0.5 % — ABNORMAL HIGH (ref 0.0–0.2)

## 2021-08-31 LAB — HEPATIC FUNCTION PANEL
ALT: 132 U/L — ABNORMAL HIGH (ref 0–44)
AST: 110 U/L — ABNORMAL HIGH (ref 15–41)
Albumin: 2.2 g/dL — ABNORMAL LOW (ref 3.5–5.0)
Alkaline Phosphatase: 194 U/L — ABNORMAL HIGH (ref 38–126)
Bilirubin, Direct: 0.4 mg/dL — ABNORMAL HIGH (ref 0.0–0.2)
Indirect Bilirubin: 0.7 mg/dL (ref 0.3–0.9)
Total Bilirubin: 1.1 mg/dL (ref 0.3–1.2)
Total Protein: 5 g/dL — ABNORMAL LOW (ref 6.5–8.1)

## 2021-08-31 SURGERY — RECONSTRUCTION, LIGAMENT, MEDIAL COLLATERAL, KNEE
Anesthesia: Regional | Site: Knee | Laterality: Right

## 2021-08-31 MED ORDER — SODIUM CHLORIDE 0.9 % IR SOLN
Status: DC | PRN
  Administered 2021-08-31: 2500 mL

## 2021-08-31 MED ORDER — PROPOFOL 10 MG/ML IV BOLUS
INTRAVENOUS | Status: DC | PRN
  Administered 2021-08-31: 170 mg via INTRAVENOUS

## 2021-08-31 MED ORDER — OXYCODONE HCL 5 MG/5ML PO SOLN: 5.0000 mg | Freq: Once | ORAL | Status: DC | PRN

## 2021-08-31 MED ORDER — MIDAZOLAM HCL 5 MG/5ML IJ SOLN
INTRAMUSCULAR | Status: DC | PRN
  Administered 2021-08-31: 2 mg via INTRAVENOUS

## 2021-08-31 MED ORDER — PHENYLEPHRINE 40 MCG/ML (10ML) SYRINGE FOR IV PUSH (FOR BLOOD PRESSURE SUPPORT)
PREFILLED_SYRINGE | INTRAVENOUS | Status: DC | PRN
  Administered 2021-08-31 (×4): 80 ug via INTRAVENOUS
  Administered 2021-08-31: 40 ug via INTRAVENOUS

## 2021-08-31 MED ORDER — BUPIVACAINE HCL (PF) 0.25 % IJ SOLN
INTRAMUSCULAR | Status: DC | PRN
  Administered 2021-08-31: 30 mL

## 2021-08-31 MED ORDER — FENTANYL CITRATE (PF) 250 MCG/5ML IJ SOLN
INTRAMUSCULAR | Status: AC
  Filled 2021-08-31: qty 5

## 2021-08-31 MED ORDER — CLONIDINE HCL (ANALGESIA) 100 MCG/ML EP SOLN
EPIDURAL | Status: DC | PRN
  Administered 2021-08-31: 100 ug

## 2021-08-31 MED ORDER — PHENYLEPHRINE HCL (PRESSORS) 10 MG/ML IV SOLN
INTRAVENOUS | Status: AC
  Filled 2021-08-31: qty 1

## 2021-08-31 MED ORDER — MORPHINE SULFATE (PF) 4 MG/ML IV SOLN
INTRAVENOUS | Status: AC
  Filled 2021-08-31: qty 1

## 2021-08-31 MED ORDER — MORPHINE SULFATE (PF) 4 MG/ML IV SOLN
INTRAVENOUS | Status: DC | PRN
  Administered 2021-08-31: 8 mg via INTRAVENOUS

## 2021-08-31 MED ORDER — SENNOSIDES-DOCUSATE SODIUM 8.6-50 MG PO TABS
1.0000 | ORAL_TABLET | Freq: Every day | ORAL | Status: DC
  Administered 2021-08-31 – 2021-09-06 (×6): 1 via ORAL
  Filled 2021-08-31 (×6): qty 1

## 2021-08-31 MED ORDER — ROPIVACAINE HCL 7.5 MG/ML IJ SOLN
INTRAMUSCULAR | Status: DC | PRN
  Administered 2021-08-31: 20 mL via PERINEURAL

## 2021-08-31 MED ORDER — FENTANYL CITRATE (PF) 100 MCG/2ML IJ SOLN
INTRAMUSCULAR | Status: AC
  Administered 2021-08-31: 100 ug via INTRAVENOUS
  Filled 2021-08-31: qty 2

## 2021-08-31 MED ORDER — CLONIDINE HCL (ANALGESIA) 100 MCG/ML EP SOLN
EPIDURAL | Status: AC
  Filled 2021-08-31: qty 10

## 2021-08-31 MED ORDER — MIDAZOLAM HCL 2 MG/2ML IJ SOLN: 2.0000 mg | Freq: Once | INTRAMUSCULAR | Status: AC

## 2021-08-31 MED ORDER — OXYCODONE HCL 5 MG PO TABS: 5.0000 mg | ORAL_TABLET | Freq: Once | ORAL | Status: DC | PRN

## 2021-08-31 MED ORDER — BUPIVACAINE HCL (PF) 0.25 % IJ SOLN
INTRAMUSCULAR | Status: AC
  Filled 2021-08-31: qty 30

## 2021-08-31 MED ORDER — DEXAMETHASONE SODIUM PHOSPHATE 10 MG/ML IJ SOLN
INTRAMUSCULAR | Status: DC | PRN
  Administered 2021-08-31: 10 mg via INTRAVENOUS

## 2021-08-31 MED ORDER — FENTANYL CITRATE (PF) 100 MCG/2ML IJ SOLN: 100.0000 ug | Freq: Once | INTRAMUSCULAR | Status: AC

## 2021-08-31 MED ORDER — MIDAZOLAM HCL 2 MG/2ML IJ SOLN
INTRAMUSCULAR | Status: AC
  Filled 2021-08-31: qty 2

## 2021-08-31 MED ORDER — ONDANSETRON HCL 4 MG/2ML IJ SOLN
INTRAMUSCULAR | Status: DC | PRN
  Administered 2021-08-31: 4 mg via INTRAVENOUS

## 2021-08-31 MED ORDER — PHENYLEPHRINE 40 MCG/ML (10ML) SYRINGE FOR IV PUSH (FOR BLOOD PRESSURE SUPPORT)
PREFILLED_SYRINGE | INTRAVENOUS | Status: AC
  Filled 2021-08-31: qty 20

## 2021-08-31 MED ORDER — VANCOMYCIN HCL 1000 MG IV SOLR
INTRAVENOUS | Status: DC | PRN
  Administered 2021-08-31: 1000 mg

## 2021-08-31 MED ORDER — FENTANYL CITRATE (PF) 100 MCG/2ML IJ SOLN: 25.0000 ug | INTRAMUSCULAR | Status: DC | PRN

## 2021-08-31 MED ORDER — LACTATED RINGERS IV SOLN: INTRAVENOUS | Status: DC

## 2021-08-31 MED ORDER — MIDAZOLAM HCL 2 MG/2ML IJ SOLN
INTRAMUSCULAR | Status: AC
  Administered 2021-08-31: 2 mg via INTRAVENOUS
  Filled 2021-08-31: qty 2

## 2021-08-31 MED ORDER — FENTANYL CITRATE (PF) 100 MCG/2ML IJ SOLN
INTRAMUSCULAR | Status: DC | PRN
  Administered 2021-08-31 (×2): 25 ug via INTRAVENOUS
  Administered 2021-08-31 (×2): 50 ug via INTRAVENOUS
  Administered 2021-08-31: 25 ug via INTRAVENOUS
  Administered 2021-08-31: 50 ug via INTRAVENOUS
  Administered 2021-08-31: 25 ug via INTRAVENOUS
  Administered 2021-08-31: 100 ug via INTRAVENOUS
  Administered 2021-08-31 (×3): 50 ug via INTRAVENOUS

## 2021-08-31 MED ORDER — PROPOFOL 10 MG/ML IV BOLUS
INTRAVENOUS | Status: AC
  Filled 2021-08-31: qty 20

## 2021-08-31 MED ORDER — PHENYLEPHRINE HCL-NACL 20-0.9 MG/250ML-% IV SOLN
INTRAVENOUS | Status: DC | PRN
  Administered 2021-08-31: 20 ug/min via INTRAVENOUS

## 2021-08-31 MED ORDER — VANCOMYCIN HCL 1000 MG IV SOLR
INTRAVENOUS | Status: AC
  Filled 2021-08-31: qty 20

## 2021-08-31 MED ORDER — ONDANSETRON HCL 4 MG/2ML IJ SOLN: 4.0000 mg | Freq: Four times a day (QID) | INTRAMUSCULAR | Status: DC | PRN

## 2021-08-31 MED ORDER — LACTATED RINGERS IV SOLN: INTRAVENOUS | Status: DC | PRN

## 2021-08-31 MED ORDER — DOCUSATE SODIUM 100 MG PO CAPS
100.0000 mg | ORAL_CAPSULE | Freq: Every day | ORAL | Status: DC
  Administered 2021-09-01: 100 mg via ORAL
  Filled 2021-08-31: qty 1

## 2021-08-31 SURGICAL SUPPLY — 85 items
ANCH SUT 2 NDL DX FBRTK (Anchor) ×1 IMPLANT
ANCH SUT 2 SWLK 19.1 CLS EYLT (Anchor) ×1 IMPLANT
ANCH SUT SWLK 19.1X5.5 CLS EL (Anchor) ×1 IMPLANT
ANCHOR KNOTLESS SUT DX #2 (Anchor) ×1 IMPLANT
ANCHOR PEEK SWIVEL LOCK 5.5 (Anchor) ×1 IMPLANT
ANCHOR SUT 1.8 FBRTK KNTLS 2SU (Anchor) ×1 IMPLANT
ANCHOR SWIVELOCK BIO 4.75X19.1 (Anchor) ×1 IMPLANT
BAG COUNTER SPONGE SURGICOUNT (BAG) ×2 IMPLANT
BAG SPNG CNTER NS LX DISP (BAG) ×1
BLADE CUTTER GATOR 3.5 (BLADE) IMPLANT
BLADE EXCALIBUR 4.0X13 (MISCELLANEOUS) IMPLANT
BLADE SURG 10 STRL SS (BLADE) ×2 IMPLANT
BLADE SURG 15 STRL LF DISP TIS (BLADE) ×2 IMPLANT
BLADE SURG 15 STRL SS (BLADE) ×8
BNDG CMPR MED 15X6 ELC VLCR LF (GAUZE/BANDAGES/DRESSINGS) ×1
BNDG ELASTIC 6X15 VLCR STRL LF (GAUZE/BANDAGES/DRESSINGS) ×1 IMPLANT
COVER MAYO STAND STRL (DRAPES) IMPLANT
COVER SURGICAL LIGHT HANDLE (MISCELLANEOUS) ×2 IMPLANT
CUFF TOURN SGL QUICK 34 (TOURNIQUET CUFF) ×2
CUFF TOURN SGL QUICK 42 (TOURNIQUET CUFF) IMPLANT
CUFF TRNQT CYL 34X4.125X (TOURNIQUET CUFF) ×1 IMPLANT
DECANTER SPIKE VIAL GLASS SM (MISCELLANEOUS) ×2 IMPLANT
DRAPE ARTHROSCOPY W/POUCH 114 (DRAPES) ×2 IMPLANT
DRAPE C-ARM 42X72 X-RAY (DRAPES) ×2 IMPLANT
DRAPE INCISE IOBAN 66X45 STRL (DRAPES) ×2 IMPLANT
DRAPE ORTHO SPLIT 77X108 STRL (DRAPES) ×2
DRAPE SURG ORHT 6 SPLT 77X108 (DRAPES) ×1 IMPLANT
DRAPE U-SHAPE 47X51 STRL (DRAPES) ×2 IMPLANT
DRSG AQUACEL AG ADV 3.5X10 (GAUZE/BANDAGES/DRESSINGS) ×1 IMPLANT
ELECT REM PT RETURN 9FT ADLT (ELECTROSURGICAL) ×2
ELECTRODE REM PT RTRN 9FT ADLT (ELECTROSURGICAL) ×1 IMPLANT
EVACUATOR 1/8 PVC DRAIN (DRAIN) IMPLANT
GAUZE SPONGE 4X4 12PLY STRL (GAUZE/BANDAGES/DRESSINGS) ×1 IMPLANT
GLOVE SRG 8 PF TXTR STRL LF DI (GLOVE) ×1 IMPLANT
GLOVE SURG LTX SZ8 (GLOVE) ×2 IMPLANT
GLOVE SURG UNDER POLY LF SZ8 (GLOVE) ×2
GOWN STRL REUS W/ TWL LRG LVL3 (GOWN DISPOSABLE) ×3 IMPLANT
GOWN STRL REUS W/ TWL XL LVL3 (GOWN DISPOSABLE) ×1 IMPLANT
GOWN STRL REUS W/TWL LRG LVL3 (GOWN DISPOSABLE) ×6
GOWN STRL REUS W/TWL XL LVL3 (GOWN DISPOSABLE) ×2
GRAFT TISS PRESUTURED 150-250 (Tissue) IMPLANT
KIT BASIN OR (CUSTOM PROCEDURE TRAY) ×2 IMPLANT
KIT BIO-TENODESIS 3X8 DISP (MISCELLANEOUS) ×2
KIT CVD SPEAR FBRTK 1.8 DRILL (KITS) ×1 IMPLANT
KIT FIBERTAK DX KNTLS DISP (KITS) ×1 IMPLANT
KIT INSRT BABSR STRL DISP BTN (MISCELLANEOUS) IMPLANT
KIT TURNOVER KIT B (KITS) ×2 IMPLANT
MANIFOLD NEPTUNE II (INSTRUMENTS) ×2 IMPLANT
NDL 18GX1X1/2 (RX/OR ONLY) (NEEDLE) ×1 IMPLANT
NDL TAPERED W/ NITINOL LOOP (MISCELLANEOUS) IMPLANT
NEEDLE 18GX1X1/2 (RX/OR ONLY) (NEEDLE) ×2 IMPLANT
NEEDLE TAPERED W/ NITINOL LOOP (MISCELLANEOUS) ×2 IMPLANT
NS IRRIG 1000ML POUR BTL (IV SOLUTION) ×2 IMPLANT
PACK ARTHROSCOPY DSU (CUSTOM PROCEDURE TRAY) ×2 IMPLANT
PAD ARMBOARD 7.5X6 YLW CONV (MISCELLANEOUS) ×4 IMPLANT
PAD CAST 4YDX4 CTTN HI CHSV (CAST SUPPLIES) ×1 IMPLANT
PADDING CAST COTTON 4X4 STRL (CAST SUPPLIES) ×2
PADDING CAST COTTON 6X4 STRL (CAST SUPPLIES) ×4 IMPLANT
PASSER SUT SWANSON 36MM LOOP (INSTRUMENTS) ×2 IMPLANT
PENCIL BUTTON HOLSTER BLD 10FT (ELECTRODE) ×2 IMPLANT
SPONGE T-LAP 18X18 ~~LOC~~+RFID (SPONGE) ×2 IMPLANT
STRIP CLOSURE SKIN 1/2X4 (GAUZE/BANDAGES/DRESSINGS) ×2 IMPLANT
SUCTION FRAZIER HANDLE 10FR (MISCELLANEOUS) ×1
SUCTION TUBE FRAZIER 10FR DISP (MISCELLANEOUS) ×1 IMPLANT
SUT 0 FIBERLOOP 38 BLUE TPR ND (SUTURE) ×14
SUT ETHILON 3 0 PS 1 (SUTURE) ×2 IMPLANT
SUT MNCRL AB 3-0 PS2 18 (SUTURE) ×1 IMPLANT
SUT VIC AB 0 CT1 27 (SUTURE) ×6
SUT VIC AB 0 CT1 27XBRD ANBCTR (SUTURE) ×2 IMPLANT
SUT VIC AB 1 CT1 36 (SUTURE) ×3 IMPLANT
SUT VIC AB 2-0 CT1 27 (SUTURE) ×8
SUT VIC AB 2-0 CT1 TAPERPNT 27 (SUTURE) ×1 IMPLANT
SUT VICRYL 0 UR6 27IN ABS (SUTURE) ×5 IMPLANT
SUTURE 0 FIBERLP 38 BLU TPR ND (SUTURE) IMPLANT
SUTURE TAPE 1.3 FIBERLOP 20 ST (SUTURE) IMPLANT
SUTURETAPE 1.3 FIBERLOOP 20 ST (SUTURE) ×4
SYR 30ML LL (SYRINGE) ×2 IMPLANT
SYR BULB IRRIG 60ML STRL (SYRINGE) ×2 IMPLANT
TENDON PRE-SUTURED FLEXIGRAFT (Tissue) ×2 IMPLANT
TOWEL GREEN STERILE (TOWEL DISPOSABLE) ×2 IMPLANT
TOWEL GREEN STERILE FF (TOWEL DISPOSABLE) ×2 IMPLANT
TUBING ARTHROSCOPY IRRIG 16FT (MISCELLANEOUS) ×2 IMPLANT
UNDERPAD 30X36 HEAVY ABSORB (UNDERPADS AND DIAPERS) ×2 IMPLANT
WATER STERILE IRR 1000ML POUR (IV SOLUTION) ×2 IMPLANT
YANKAUER SUCT BULB TIP NO VENT (SUCTIONS) ×2 IMPLANT

## 2021-08-31 NOTE — Progress Notes (Signed)
Pt off floor to OR. Report called to short-stay.   Robina Ade, RN

## 2021-08-31 NOTE — Progress Notes (Signed)
Pt arrived back to 4NP from PACU. Report previously called from PACU RN. Pt a&ox4, vitals WNL and charted, 3/10 pain in rt knee. No c/o numbness, tingling, or decreased sensation in lower extremities. Good dorsiflexion/plantar flexion bilaterally.  Robina Ade, RN
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
## 2021-08-31 NOTE — Progress Notes (Addendum)
Medial reconstruction performed right knee Stay in the immobilizer for a week nonweightbearing We will start CPM machine in 1 week for range of motion of that right knee Plan ACL PCL arthroscopic reconstruction in 4 to 6 weeks on the right knee Concurrent with that surgery we will perform posterolateral corner reconstruction on the left knee based on examination under anesthesia today.  He is okay to weight-bear on the left knee but we recommend hinged knee brace for that left knee for weightbearing

## 2021-08-31 NOTE — Transfer of Care (Signed)
Immediate Anesthesia Transfer of Care Note  Patient: Devin Jones  Procedure(s) Performed: RIGHT KNEE MEDIAL COLLATERAL LIGAMENT REPAIR/RECONSTRUCTION (ALL OPEN) (Right: Knee)  Patient Location: PACU  Anesthesia Type:GA combined with regional for post-op pain  Level of Consciousness: awake, alert  and oriented  Airway & Oxygen Therapy: Patient Spontanous Breathing and Patient connected to nasal cannula oxygen  Post-op Assessment: Report given to RN and Post -op Vital signs reviewed and stable  Post vital signs: Reviewed and stable  Last Vitals:  Vitals Value Taken Time  BP 128/88 08/31/21 1641  Temp    Pulse    Resp 17 08/31/21 1644  SpO2    Vitals shown include unvalidated device data.  Last Pain:  Vitals:   08/31/21 1136  TempSrc: Oral  PainSc:       Patients Stated Pain Goal: 2 (08/30/21 2000)  Complications: No notable events documented.

## 2021-08-31 NOTE — Anesthesia Procedure Notes (Signed)
Procedure Name: LMA Insertion Date/Time: 08/31/2021 1:00 PM Performed by: Cy Blamer, CRNA Pre-anesthesia Checklist: Patient identified, Emergency Drugs available, Suction available, Patient being monitored and Timeout performed Patient Re-evaluated:Patient Re-evaluated prior to induction Oxygen Delivery Method: Circle system utilized Preoxygenation: Pre-oxygenation with 100% oxygen Induction Type: IV induction LMA: LMA inserted LMA Size: 4.0 Tube type: Oral Number of attempts: 1 Placement Confirmation: positive ETCO2 and breath sounds checked- equal and bilateral Tube secured with: Tape Dental Injury: Teeth and Oropharynx as per pre-operative assessment

## 2021-08-31 NOTE — Anesthesia Procedure Notes (Signed)
Anesthesia Regional Block: Adductor canal block   Pre-Anesthetic Checklist: , timeout performed,  Correct Patient, Correct Site, Correct Laterality,  Correct Procedure, Correct Position, site marked,  Risks and benefits discussed,  Surgical consent,  Pre-op evaluation,  At surgeon's request and post-op pain management  Laterality: Right  Prep: chloraprep       Needles:  Injection technique: Single-shot  Needle Type: Echogenic Needle     Needle Length: 9cm  Needle Gauge: 21     Additional Needles:   Narrative:  Start time: 08/31/2021 12:01 PM End time: 08/31/2021 12:10 PM Injection made incrementally with aspirations every 5 mL.  Performed by: Personally  Anesthesiologist: Achille Rich, MD  Additional Notes: Pt tolerated the procedure well.

## 2021-08-31 NOTE — Progress Notes (Addendum)
Patient stable.  Plan for MCL repair versus reconstruction today with delayed ACL PCL reconstruction in 4 to 6 weeks.  With the amount of medial instability present it is unlikely we will get a watertight repair of the capsule back to the medial tibial plateau.  Extravasation of fluid would be likely with subsequent ACL PCL reconstruction.  For that reason we will stage this reconstruction.  Within 1 week of injury tissue planes should be visible enough to achieve an anatomic repair which may or may not need allograft augmentation or internal brace augmentation.  Risk and benefits discussed.  All questions answered. Skin intact in the right knee region and ankle dorsiflexion intact with palpable pedal pulses.  Lovenox discontinued yesterday at approximately 10 AM.

## 2021-08-31 NOTE — NC FL2 (Signed)
[place] MEDICAID FL2 LEVEL OF CARE SCREENING TOOL     IDENTIFICATION  Patient Name: Devin Jones Birthdate: [birth year] Sex: male Admission Date (Current Location): 08/25/2021  [facility] and [place] Number:  Producer, television/film/video and Address:  [facility], [address], [place], [place] [address]      Provider Number: [id number]  Attending Physician Name and Address:  Md, Trauma, MD  Relative Name and Phone Number:  brother Abelardo Seidner, phone: [phone]    Current Level of Care: Hospital Recommended Level of Care: Skilled Nursing Facility Prior Approval Number:    Date Approved/Denied:   PASRR Number: [id number]  Discharge Plan: SNF    Current Diagnoses: Patient Active Problem List   Diagnosis Date Noted   Pelvic fracture (HCC) 08/26/2021    Orientation RESPIRATION BLADDER Height & Weight     Self, Time, Situation, Place  Normal Continent Weight: 81.6 kg Height:  5\' 11"  (180.3 cm)  BEHAVIORAL SYMPTOMS/MOOD NEUROLOGICAL BOWEL NUTRITION STATUS      Continent Diet  AMBULATORY STATUS COMMUNICATION OF NEEDS Skin   Extensive Assist Verbally  (orthopedic surgeries)                       Personal Care Assistance Level of Assistance  Bathing, Feeding, Dressing Bathing Assistance: Limited assistance Feeding assistance: Independent Dressing Assistance: Limited assistance     Functional Limitations Info  Sight Sight Info: Adequate        SPECIAL CARE FACTORS FREQUENCY  PT (By licensed PT), OT (By licensed OT)     PT Frequency: 5x weekly OT Frequency: 5x weekly            Contractures Contractures Info: Not present    Additional Factors Info  Code Status, Allergies Code Status Info: Full Allergies Info: No known allergies           Current Medications (08/31/2021):  This is the current hospital active medication list Current Facility-Administered Medications  Medication Dose Route Frequency Provider Last  Rate Last Admin   [MAR Hold] acetaminophen (TYLENOL) tablet 650 mg  650 mg Oral Q6H 09/02/2021, PA-C   650 mg at 08/30/21 2000   [MAR Hold] ascorbic acid (VITAMIN C) tablet 500 mg  500 mg Oral Daily 09/01/21, PA-C   500 mg at 08/30/21 09/01/21   [facility] Hold] bisacodyl (DULCOLAX) suppository 10 mg  10 mg Rectal Daily PRN Meuth, Brooke A, PA-C       bupivacaine (PF) (MARCAINE) 0.25 % injection    PRN [facility], MD   30 mL at 08/31/21 1351   [MAR Hold] chlorhexidine (PERIDEX) 0.12 % solution 15 mL  15 mL Mouth Rinse BID 09/02/21, PA-C   15 mL at 08/31/21 1135   [MAR Hold] Chlorhexidine Gluconate Cloth 2 % PADS 6 each  6 each Topical Daily 09/02/21, PA-C   6 each at 08/31/21 0851   [MAR Hold] cholecalciferol (VITAMIN D3) tablet 2,000 Units  2,000 Units Oral BID 09/02/21, PA-C   2,000 Units at 08/30/21 2123   cloNIDine (DURACLON) 100 mcg/mL inj- OR Only    PRN 2124, MD   100 mcg at 08/31/21 1346   dexmedetomidine (PRECEDEX) 400 MCG/100ML (4 mcg/mL) infusion  0.2-0.7 mcg/kg/hr Intravenous Continuous 09/02/21, PA-C       [facility] Hold] docusate sodium (COLACE) capsule 100 mg  100 mg Oral Daily [facility], PA-C       [facility] Hold]  folic acid (FOLVITE) tablet 1 mg  1 mg Oral Daily Montez Morita, PA-C   1 mg at 08/30/21 7544   [facility] Hold] gabapentin (NEURONTIN) capsule 300 mg  300 mg Oral TID Montez Morita, PA-C   300 mg at 08/30/21 2124   [MAR Hold] HYDROmorphone (DILAUDID) injection 1 mg  1 mg Intravenous Q3H PRN Montez Morita, PA-C   1 mg at 08/28/21 1522   [MAR Hold] LORazepam (ATIVAN) injection 0-4 mg  0-4 mg Intravenous Q12H Meuth, Brooke A, PA-C   2 mg at 08/30/21 2124   [MAR Hold] menthol-cetylpyridinium (CEPACOL) lozenge 3 mg  1 lozenge Oral PRN Violeta Gelinas, MD       [facility] Hold] methocarbamol (ROBAXIN) 500 mg in dextrose 5 % 50 mL IVPB  500 mg Intravenous Q6H PRN Montez Morita, PA-C   Stopped at 08/27/21 1426   [MAR Hold] midazolam (VERSED) injection 1-2 mg  1-2 mg  Intravenous Q1H PRN Montez Morita, PA-C       morphine 4 MG/ML injection    PRN Cammy Copa, MD   8 mg at 08/31/21 1349   [MAR Hold] multivitamin with minerals tablet 1 tablet  1 tablet Oral Daily Montez Morita, PA-C   1 tablet at 08/30/21 0922   [MAR Hold] ondansetron (ZOFRAN) injection 4 mg  4 mg Intravenous Q6H PRN Montez Morita, PA-C       [facility] Hold] oxyCODONE (Oxy IR/ROXICODONE) immediate release tablet 10 mg  10 mg Oral Q4H PRN Montez Morita, PA-C   10 mg at 08/29/21 1037   [MAR Hold] oxyCODONE (Oxy IR/ROXICODONE) immediate release tablet 5 mg  5 mg Oral Q4H PRN Montez Morita, PA-C   5 mg at 08/29/21 1823   [MAR Hold] polyethylene glycol (MIRALAX / GLYCOLAX) packet 17 g  17 g Oral BID Meuth, Brooke A, PA-C   17 g at 08/30/21 2123   [MAR Hold] prochlorperazine (COMPAZINE) injection 10 mg  10 mg Intravenous Q4H PRN Montez Morita, PA-C       [facility] Hold] senna-docusate (Senokot-S) tablet 1 tablet  1 tablet Oral QHS Eric Form, PA-C       [MAR Hold] simethicone (MYLICON) chewable tablet 80 mg  80 mg Oral QID PRN Montez Morita, PA-C       sodium chloride irrigation 0.9 %    PRN Cammy Copa, MD   2,500 mL at 08/31/21 1352   [MAR Hold] thiamine tablet 100 mg  100 mg Oral Daily Montez Morita, PA-C   100 mg at 08/30/21 9201   Or   [MAR Hold] thiamine (B-1) injection 100 mg  100 mg Intravenous Daily Montez Morita, PA-C   100 mg at 08/31/21 0071   vancomycin (VANCOCIN) powder    PRN Cammy Copa, MD   1,000 mg at 08/31/21 1540   Facility-Administered Medications Ordered in Other Encounters  Medication Dose Route Frequency Provider Last Rate Last Admin   dexamethasone (DECADRON) injection   Intravenous Anesthesia Intra-op Cy Blamer, CRNA   10 mg at 08/31/21 1302   fentaNYL (SUBLIMAZE) injection   Intravenous Anesthesia Intra-op Cy Blamer, CRNA   50 mcg at 08/31/21 1609   lactated ringers infusion   Intravenous Continuous PRN Cy Blamer, CRNA   New Bag at 08/31/21 1205    midazolam (VERSED) 5 MG/5ML injection   Intravenous Anesthesia Intra-op Cy Blamer, CRNA   2 mg at 08/31/21 1244   ondansetron (ZOFRAN) injection   Intravenous Anesthesia Intra-op Cy Blamer, CRNA   4 mg at 08/31/21  1249   phenylephrine (NEO-SYNEPHRINE) 20mg /NS premix infusion   Intravenous Continuous PRN , CRNA 37.5 mL/hr at 08/31/21 1532 50 mcg/min at 08/31/21 1532   PHENYLephrine 40 mcg/ml in normal saline Adult IV Push Syringe (For Blood Pressure Support)   Intravenous Anesthesia Intra-op 09/02/21, CRNA   80 mcg at 08/31/21 1339   propofol (DIPRIVAN) 10 mg/mL bolus/IV push   Intravenous Anesthesia Intra-op 09/02/21, CRNA   170 mg at 08/31/21 1258   ropivacaine (PF) 7.5 mg/mL (0.75%) (NAROPIN) injection   Peri-NEURAL Anesthesia Intra-op 09/02/21, MD   20 mL at 08/31/21 1209     Discharge Medications: Please see discharge summary for a list of discharge medications.  Relevant Imaging Results:  Relevant Lab Results:   Additional Information SS # [id number]  [id number], RN, BSN  Trauma/Neuro ICU Case Manager [phone]

## 2021-08-31 NOTE — Progress Notes (Signed)
Patient ID: Devin Jones, male   DOB: Oct 22, 1965, 55 y.o.   MRN: [id number] [facility] Surgery Progress Note  3 Days Post-Op  Subjective: CC- no new complaints. NPO for OR today. Still no BM and starting to feel a little bloated. No nausea or emesis and appetite improving. Voiding well after foley removal. Pain well controlled  Objective: Vital signs in last 24 hours: Temp:  [97.7 F (36.5 C)-98.9 F (37.2 C)] 97.7 F (36.5 C) (12/15 0804) Pulse Rate:  [95-104] 103 (12/15 0804) Resp:  [14-20] 16 (12/15 0804) BP: (116-143)/(72-93) 130/93 (12/15 0804) SpO2:  [90 %-99 %] 93 % (12/15 0804) Last BM Date:  (PTA)  Intake/Output from previous day: 12/14 0701 - 12/15 0700 In: 1020 [P.O.:1020] Out: 4200 [Urine:4200] Intake/Output this shift: No intake/output data recorded.  PE: Gen:  Alert, NAD, pleasant HEENT: EOM's intact, pupils chronically unequal in size. C-spine nontender, no neck pain with active ROM Card:  RRR, no M/G/R heard, palpable pedal pulses bilaterally Pulm:  CTAB, no W/R/R, rate and effort normal on room air Abd: Soft, NT/ND, +BS Ext:  KI to RLE, SCD on LLE. Splint to RUE. Edema noted to right hand Psych: A&Ox4  Skin: no rashes noted, warm and dry  Lab Results:  Recent Labs    08/30/21 0530 08/31/21 0408  WBC 8.1 7.9  HGB 10.0* 10.0*  HCT 30.2* 30.0*  PLT 117* 135*    BMET Recent Labs    08/30/21 0530 08/31/21 0408  NA 138 137  K 4.0 4.9  CL 103 106  CO2 28 23  GLUCOSE 107* 109*  BUN 16 21*  CREATININE 0.84 0.99  CALCIUM 8.4* 8.2*    PT/INR No results for input(s): LABPROT, INR in the last 72 hours. CMP     Component Value Date/Time   NA 137 08/31/2021 0408   K 4.9 08/31/2021 0408   CL 106 08/31/2021 0408   CO2 23 08/31/2021 0408   GLUCOSE 109 (H) 08/31/2021 0408   BUN 21 (H) 08/31/2021 0408   CREATININE 0.99 08/31/2021 0408   CALCIUM 8.2 (L) 08/31/2021 0408   PROT 5.0 (L) 08/31/2021 0408   ALBUMIN 2.2 (L) 08/31/2021 0408    AST 110 (H) 08/31/2021 0408   ALT 132 (H) 08/31/2021 0408   ALKPHOS 194 (H) 08/31/2021 0408   BILITOT 1.1 08/31/2021 0408   GFRNONAA >60 08/31/2021 0408   Lipase     Component Value Date/Time   LIPASE 27 08/29/2021 0500       Studies/Results: No results found.  Anti-infectives: Anti-infectives (From admission, onward)    Start     Dose/Rate Route Frequency Ordered Stop   08/31/21 0600  ceFAZolin (ANCEF) IVPB 2g/100 mL premix        2 g 200 mL/hr over 30 Minutes Intravenous To Surgery 08/30/21 1603 09/01/21 0600   08/28/21 1800  ceFAZolin (ANCEF) IVPB 2g/100 mL premix        2 g 200 mL/hr over 30 Minutes Intravenous Every 8 hours 08/28/21 1540 08/29/21 1104   08/28/21 0918  ceFAZolin (ANCEF) 2-4 GM/100ML-% IVPB       Note to Pharmacy: Oswaldo Milian H: cabinet override      08/28/21 0918 08/28/21 1557        Assessment/Plan PHBC Right knee dislocation - reduced by ER provider, x-rays normal post reduction, per Dr. August Saucer, MRI shows ACL, PCL, MCL injuries and medial capsule injury. OR 12/15 with Dr. August Saucer. L knee injury - MRI done, per Dr. August Saucer -  lateral meniscal tear, no urgent surgery Right ulna/radial fracture - S/P ORIF by Dr. Marcelino Scot 12/12. NWB R wrist can WB thru elbow Pelvis fracture left pubic rami, acetabulum and sacral ala - S/P SI screw x 2 and anterior plating by Dr. Marcelino Scot 12/12, NWB BLE x8 weeks - bed to chair transfers x8 weeks L1, L2, L4 and L5 transverse process fractures - pain control Sternal fracture - pain control TBI/small SDH/small subarachnoid hemorrhage - repeat CT head done, Dr. Reatha Armour consulted, no further repeat imaging needed/ monitor neuro exam C spine - cleared, no neck pain Right first rib fracture - pain control, incentive spirometer Right clavicular head and scapula fracture - per Dr. Marlou Sa, nonop Small liver laceration/contusion - hgb stable. transaminases elevated as expected and abdominal exam benign Right Kidney Laceration - monitor  abdominal exam and HGB, hematuria from kidney injury has improved Acute blood loss anemia - improved Thrombocytopenia - consumptive, improving, platelets up to 135 ETOH intoxication - admit ETOH 299, CIWA, reports he does not drink daily but drinks a lot when he does FEN - miralax BID, suppository prn, NPO for OR, okay for reg diet after VTE - start lovenox 12/14 ID - Ancef and Tdap Booster given in the trauma bay. Foley: foley out 12/14, voiding well  Dispo - 4NP, PT/OT/ST - currently recommending SNF. OR with Dr. Marlou Sa 12/15.   LOS: 5 days    [facility] 08/31/2021, 8:07 AM Please see Amion for pager number during day hours 7:00am-4:30pm

## 2021-08-31 NOTE — Progress Notes (Signed)
Orthopedic Tech Progress Note Patient Details:  Devin Jones [birth year] [id number]  Patient ID: Devin Jones, male   DOB: April 17, 1966, 55 y.o.   MRN: [id number] Spoke with RN and patient already has knee immobilizer on.   Darleen Crocker 08/31/2021, 6:20 PM

## 2021-08-31 NOTE — Anesthesia Preprocedure Evaluation (Signed)
Anesthesia Evaluation  Patient identified by MRN, date of birth, ID band Patient awake    Reviewed: Allergy & Precautions, H&P , NPO status , Patient's Chart, lab work & pertinent test results  Airway Mallampati: II   Neck ROM: full    Dental   Pulmonary neg pulmonary ROS,    breath sounds clear to auscultation       Cardiovascular negative cardio ROS   Rhythm:regular Rate:Normal     Neuro/Psych    GI/Hepatic (+)     substance abuse  alcohol use,   Endo/Other    Renal/GU      Musculoskeletal Right knee dislocation   Abdominal   Peds  Hematology  (+) anemia ,   Anesthesia Other Findings   Reproductive/Obstetrics                             Anesthesia Physical Anesthesia Plan  ASA: 2  Anesthesia Plan: General   Post-op Pain Management: Regional block   Induction: Intravenous  PONV Risk Score and Plan: 2 and Ondansetron, Dexamethasone, Midazolam and Treatment may vary due to age or medical condition  Airway Management Planned: LMA  Additional Equipment:   Intra-op Plan:   Post-operative Plan: Extubation in OR  Informed Consent: I have reviewed the patients History and Physical, chart, labs and discussed the procedure including the risks, benefits and alternatives for the proposed anesthesia with the patient or authorized representative who has indicated his/her understanding and acceptance.     Dental advisory given  Plan Discussed with: CRNA, Anesthesiologist and Surgeon  Anesthesia Plan Comments:         Anesthesia Quick Evaluation

## 2021-08-31 NOTE — Brief Op Note (Signed)
° °  08/31/2021  4:56 PM  PATIENT:  Devin Jones  55 y.o. male  PRE-OPERATIVE DIAGNOSIS:  right knee dislocation  POST-OPERATIVE DIAGNOSIS:  right knee dislocation medial meniscal capsular meniscal tear -lateral collateral and posterior lateral corner injury left knee PROCEDURE:  Procedure(s): RIGHT KNEE MEDIAL COLLATERAL LIGAMENT REPAIR/RECONSTRUCTION (ALL OPEN) Medial meniscal repair  SURGEON:  Surgeon(s): Cammy Copa, MD  ASSISTANT: Karenann Cai  ANESTHESIA:   general  EBL: 25 ml    Total I/O In: 1200 [I.V.:1200] Out: 450 [Urine:400; Blood:50]  BLOOD ADMINISTERED: none  DRAINS: none   LOCAL MEDICATIONS USED: Vancomycin Marcaine morphine clonidine  SPECIMEN:  No Specimen  COUNTS:  YES  TOURNIQUET:   Total Tourniquet Time Documented: Thigh (Right) - 120 minutes Total: Thigh (Right) - 120 minutes   DICTATION: .Other Dictation: Dictation Number [id number]  PLAN OF CARE: Admit to inpatient   PATIENT DISPOSITION:  PACU - hemodynamically stable

## 2021-09-01 ENCOUNTER — Encounter (HOSPITAL_COMMUNITY): Payer: Self-pay | Admitting: Orthopedic Surgery

## 2021-09-01 MED ORDER — DOCUSATE SODIUM 100 MG PO CAPS
100.0000 mg | ORAL_CAPSULE | Freq: Two times a day (BID) | ORAL | Status: DC
  Administered 2021-09-01 – 2021-09-07 (×12): 100 mg via ORAL
  Filled 2021-09-01 (×12): qty 1

## 2021-09-01 MED ORDER — ONDANSETRON HCL 4 MG PO TABS: 4.0000 mg | ORAL_TABLET | Freq: Four times a day (QID) | ORAL | Status: DC | PRN

## 2021-09-01 MED ORDER — METHOCARBAMOL 500 MG PO TABS
500.0000 mg | ORAL_TABLET | Freq: Four times a day (QID) | ORAL | Status: DC | PRN
  Administered 2021-09-03 – 2021-09-06 (×4): 500 mg via ORAL
  Filled 2021-09-01 (×4): qty 1

## 2021-09-01 MED ORDER — METOCLOPRAMIDE HCL 5 MG/ML IJ SOLN: 5.0000 mg | Freq: Three times a day (TID) | INTRAMUSCULAR | Status: DC | PRN

## 2021-09-01 MED ORDER — CEFAZOLIN SODIUM-DEXTROSE 2-4 GM/100ML-% IV SOLN
2.0000 g | Freq: Three times a day (TID) | INTRAVENOUS | Status: AC
  Administered 2021-09-01 (×2): 2 g via INTRAVENOUS
  Filled 2021-09-01 (×2): qty 100

## 2021-09-01 MED ORDER — BISACODYL 10 MG RE SUPP
10.0000 mg | Freq: Once | RECTAL | Status: DC
  Filled 2021-09-01 (×2): qty 1

## 2021-09-01 MED ORDER — CEFAZOLIN SODIUM-DEXTROSE 2-4 GM/100ML-% IV SOLN
2.0000 g | Freq: Three times a day (TID) | INTRAVENOUS | Status: DC
  Administered 2021-09-01: 2 g via INTRAVENOUS
  Filled 2021-09-01: qty 100

## 2021-09-01 MED ORDER — METOCLOPRAMIDE HCL 10 MG PO TABS: 5.0000 mg | ORAL_TABLET | Freq: Three times a day (TID) | ORAL | Status: DC | PRN

## 2021-09-01 MED ORDER — ONDANSETRON HCL 4 MG/2ML IJ SOLN: 4.0000 mg | Freq: Four times a day (QID) | INTRAMUSCULAR | Status: DC | PRN

## 2021-09-01 MED ORDER — LACTATED RINGERS IV SOLN: INTRAVENOUS | Status: DC

## 2021-09-01 MED ORDER — ENOXAPARIN SODIUM 40 MG/0.4ML IJ SOSY
40.0000 mg | PREFILLED_SYRINGE | INTRAMUSCULAR | Status: DC
  Administered 2021-09-02 – 2021-09-07 (×6): 40 mg via SUBCUTANEOUS
  Filled 2021-09-01 (×6): qty 0.4

## 2021-09-01 NOTE — Progress Notes (Signed)
Orthopedic Tech Progress Note Patient Details:  Cledith Abdou Feb 09, 1966 [id number]  Patient ID: [name], male   DOB: 1966-05-21, 55 y.o.   MRN: [id number]  Delorise [place] Paster 09/01/2021, 6:02 PM Called in brace order from [employer].

## 2021-09-01 NOTE — Progress Notes (Signed)
°  Subjective: Patient stable.  Pain reasonably well controlled.   Objective: Vital signs in last 24 hours: Temp:  [97.7 F (36.5 C)-98.5 F (36.9 C)] 98.1 F (36.7 C) (12/16 1600) Pulse Rate:  [76-100] 100 (12/16 1600) Resp:  [14-20] 17 (12/16 1600) BP: (101-133)/(62-82) 132/82 (12/16 1600) SpO2:  [93 %-99 %] 93 % (12/16 1600)  Intake/Output from previous day: 12/15 0701 - 12/16 0700 In: 1200 [I.V.:1200] Out: 1850 [Urine:1800; Blood:50] Intake/Output this shift: Total I/O In: 480 [P.O.:480] Out: 950 [Urine:950]  Exam:  Sensation intact distally Dorsiflexion/Plantar flexion intact  Labs: Recent Labs    08/30/21 0530 08/31/21 0408  HGB 10.0* 10.0*   Recent Labs    08/30/21 0530 08/31/21 0408  WBC 8.1 7.9  RBC 3.28* 3.28*  HCT 30.2* 30.0*  PLT 117* 135*   Recent Labs    08/30/21 0530 08/31/21 0408  NA 138 137  K 4.0 4.9  CL 103 106  CO2 28 23  BUN 16 21*  CREATININE 0.84 0.99  GLUCOSE 107* 109*  CALCIUM 8.4* 8.2*   No results for input(s): LABPT, INR in the last 72 hours.  Assessment/Plan: Plan at this time is to keep the right leg straight for a week and then start range of motion exercises.  Nonweightbearing right lower extremity.  Okay for range of motion and strengthening on the left leg but he does have lateral ligament injury which may require surgery in the future.  Recommend hinged knee brace for weightbearing transfers on that left leg.   Marrianne Mood Devin Jones 09/01/2021, 5:49 PM
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
## 2021-09-01 NOTE — Op Note (Signed)
NAMEAARO, MEYERS MEDICAL RECORD NO: [id number] ACCOUNT NO: [id number] DATE OF BIRTH: 1966/06/20 FACILITY: [facility] LOCATION: [id number] PHYSICIAN: Graylin Shiver. August Saucer, MD  Operative Report   SURGEON: Dr. August Saucer.  PREOPERATIVE DIAGNOSIS:  Right knee dislocation with gross ligamentous instability medially, anteriorly and posteriorly.  POSTOPERATIVE DIAGNOSES:   1.  Torn medial side and stabilizing structures in the right knee including meniscal capsular tear of the medial meniscus. 2. Posterolateral corner and LCL injury of the left knee.  PROCEDURES:  1. Right knee examination under anesthesia with open medial-sided MCL. 2. Posterior oblique ligament reconstruction and repair with medial meniscal repair of the meniscal capsular junction.  3. Examination under anesthesia of the left knee, demonstrating LCL and posterolateral corner injury.  DESCRIPTION OF PROCEDURE:  The patient was brought to the operating room where general anesthetic was induced.  Preoperative antibiotics were administered.  Timeout was called.  Left knee was initially examined.  The patient had about 10 degrees of  hyperextension and full flexion.  The MCL was stable to valgus stress at 0 and 30 degrees.  There was instability on the lateral side to varus stress at both 0 and 30 degrees with increased posterolateral rotatory instability at 30 degrees of flexion.   PCL, ACL intact, but slightly loose with overall about 5 mm combined anterior, posterior translation.  Patella tracked normally.  On the right hand side, the patient had stable lateral collateral ligament to varus stress at 0 and 30 degrees with only  about 2 mm to 3 mm of opening at 0 degrees.  On the medial side, the patient opened with no endpoint to valgus stress, had in full extension.  Also notable is bruising and ecchymosis on the posterior aspect of the left knee consistent with his  posterolateral corner injury.  The right leg was prescrubbed with alcohol and  Betadine and allowed to air dry.  Prepped with DuraPrep solution and draped in a sterile manner.  Ioban used to cover the operative field and the leg was elevated and exsanguinated with an Esmarch wrap.   Tourniquet was inflated.  Incision made about 2 cm proximal to the medial epicondyle extending distally about 10 cm.  Skin and subcutaneous tissue sharply divided.  The patient had a disruption of all medial-sided structures beginning essentially at the  PCL medially and then extending anteriorly to the patellar tendon.  Superficial MCL was tagged.  The layers were tagged for later repair.  MCL was tagged.  The MCL had capsular tissue attached, which indicated essentially avulsion at the meniscal  capsular junction from the medial meniscus.  This extended around to the PCL.  The meniscal root was intact.  With the knee flexed and retractors in place, the PCL stump was visualized and removed.  The stump itself had one suture tagged.  Next, thorough  irrigation was performed.  The superficial MCL, although robust near its capsular attachment, attenuated distally along its length of 6 cm below the joint line.  Through that end, an allograft gracilis tendon was utilized and it was tethered to the  superficial MCL beginning just below the meniscal capsular junction and extending down and around in a U-shaped fashion and then back up the undersurface of the anterior portion of the intact superficial MCL.  This was done with locking sutures of 0  FiberWire suture.  Next, the posterior capsule was repaired using 0 FiberWire suture, beginning posteriorly along the tibial plateau about 1.5 cm from the PCL attachment and then in  that meniscal capsular tissue on the tibial side, sutures were placed  and then tying down the capsule posteriorly.  Then beginning around the 2 o'clock position on the tibial plateau, the 0 Vicryl suture was utilized to tag the meniscus back to the coronary ligament of the tibial plateau as  well as accessory 0 FiberWire  sutures were placed to reapproximate the avulsed meniscal capsular tissue from the 2 o'clock to the 5 o'clock position.  Posterior oblique ligament was also tagged slightly posterior to the normal superficial MCL.  At this time, with the capsule tied  down, the isometric 0.6 cm below the joint line was noted near the posterior aspect of the native superficial MCL attachment.  A drill hole was placed in that portion and the superficial MCL was tacked back down. It was tethered to this area using a  SwiveLock in about 20 degrees of flexion.  The posterior oblique ligament was then tethered to the anterior tissue around the anterior aspect of the semimembranosus.  This was done in extension.  Overall, a very secure repair was achieved with  essentially 1 mm opening at valgus stress at 0 degrees of extension and 2.5 mm at 30 degrees.  A stable construct was achieved.  Tourniquet was released at this time.  Thorough irrigation was performed.  Skin edges were anesthetized using Marcaine,  morphine, clonidine.  The rest of the layers were closed using 0 Vicryl suture.  This included capsular tissue as well as the tissue within layer 1.  Vancomycin powder placed prior to final closure of the skin.  And 3-0 Monocryl and Steri-Strips were  applied with an Aquacel dressing.  The joint was anesthetized also with Marcaine, morphine and clonidine.  Bulky dressing and knee immobilizer placed.  The patient tolerated the procedure well without immediate complication.  He will require staged ACL  and PCL reconstruction in 4-6 weeks and will also require at the same time posterolateral corner reconstruction.   Luke's assistance was required at all times for opening, closing, mobilization of tissue.  His assistance was a medical necessity.   MUK D: 08/31/2021 5:07:04 pm T: 09/01/2021 1:15:00 am  JOB: [id number]/ [id number]
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
## 2021-09-01 NOTE — Progress Notes (Signed)
[facility] Progress Note  1 Day Post-Op  Subjective: CC-  Having some mild discomfort in the right knee the morning, otherwise denies any pain. Just took some tylenol. Tolerating diet. Denies n/v. Passing flatus, no BM since admission.  Objective: Vital signs in last 24 hours: Temp:  [97.8 F (36.6 C)-99 F (37.2 C)] 98.5 F (36.9 C) (12/16 0741) Pulse Rate:  [76-102] 82 (12/16 0741) Resp:  [12-32] 14 (12/16 0741) BP: (101-143)/(62-93) 119/65 (12/16 0741) SpO2:  [93 %-99 %] 96 % (12/16 0741) Last BM Date:  (PTA)  Intake/Output from previous day: 12/15 0701 - 12/16 0700 In: 1200 [I.V.:1200] Out: 1850 [Urine:1800; Blood:50] Intake/Output this shift: Total I/O In: -  Out: 300 [Urine:300]  PE: Gen:  Alert, NAD, pleasant HEENT: EOM's intact, pupils chronically unequal in size R>L Card:  RRR, no M/G/R heard, palpable L pedal pulse, right toes WWP with good cap refill Pulm:  CTAB, no W/R/R, rate and effort normal on room air Abd: Soft, NT/ND, +BS Ext:  ACE wrap and KI to RLE, SCD on LLE. Splint to RUE Psych: A&Ox4  Skin: no rashes noted, warm and dry  Lab Results:  Recent Labs    08/30/21 0530 08/31/21 0408  WBC 8.1 7.9  HGB 10.0* 10.0*  HCT 30.2* 30.0*  PLT 117* 135*   BMET Recent Labs    08/30/21 0530 08/31/21 0408  NA 138 137  K 4.0 4.9  CL 103 106  CO2 28 23  GLUCOSE 107* 109*  BUN 16 21*  CREATININE 0.84 0.99  CALCIUM 8.4* 8.2*   PT/INR No results for input(s): LABPROT, INR in the last 72 hours. CMP     Component Value Date/Time   NA 137 08/31/2021 0408   K 4.9 08/31/2021 0408   CL 106 08/31/2021 0408   CO2 23 08/31/2021 0408   GLUCOSE 109 (H) 08/31/2021 0408   BUN 21 (H) 08/31/2021 0408   CREATININE 0.99 08/31/2021 0408   CALCIUM 8.2 (L) 08/31/2021 0408   PROT 5.0 (L) 08/31/2021 0408   ALBUMIN 2.2 (L) 08/31/2021 0408   AST 110 (H) 08/31/2021 0408   ALT 132 (H) 08/31/2021 0408   ALKPHOS 194 (H) 08/31/2021 0408   BILITOT 1.1  08/31/2021 0408   GFRNONAA >60 08/31/2021 0408   Lipase     Component Value Date/Time   LIPASE 27 08/29/2021 0500       Studies/Results: No results found.  Anti-infectives: Anti-infectives (From admission, onward)    Start     Dose/Rate Route Frequency Ordered Stop   09/01/21 0900  ceFAZolin (ANCEF) IVPB 2g/100 mL premix        2 g 200 mL/hr over 30 Minutes Intravenous Every 8 hours 09/01/21 0808 09/02/21 0559   08/31/21 1540  vancomycin (VANCOCIN) powder  Status:  Discontinued          As needed 08/31/21 1541 08/31/21 1639   08/31/21 0600  ceFAZolin (ANCEF) IVPB 2g/100 mL premix        2 g 200 mL/hr over 30 Minutes Intravenous To Surgery 08/30/21 1603 08/31/21 1305   08/28/21 1800  ceFAZolin (ANCEF) IVPB 2g/100 mL premix        2 g 200 mL/hr over 30 Minutes Intravenous Every 8 hours 08/28/21 1540 08/29/21 1104   08/28/21 0918  ceFAZolin (ANCEF) 2-4 GM/100ML-% IVPB       Note to Pharmacy: Oswaldo Milian H: cabinet override      08/28/21 0918 08/28/21 1557  Assessment/Plan PHBC Right knee dislocation - reduced by ER provider, x-rays normal post reduction, per Dr. Marlou Sa, MRI shows ACL, PCL, MCL injuries and medial capsule injury. S/p open MCL and medial meniscus repair 12/15 with Dr. Marlou Sa. Planning staged ACL/ PCL/ posterolateral corner reconstruction in 4-6 weeks L knee injury - MRI done, per Dr. Marlou Sa - lateral meniscal tear, no urgent surgery Right ulna/radial fracture - S/P ORIF by Dr. Marcelino Scot 12/12. NWB R wrist can WB thru elbow Pelvis fracture left pubic rami, acetabulum and sacral ala - S/P SI screw x 2 and anterior plating by Dr. Marcelino Scot 12/12, NWB BLE x8 weeks - bed to chair transfers x8 weeks L1, L2, L4 and L5 transverse process fractures - pain control Sternal fracture - pain control TBI/small SDH/small subarachnoid hemorrhage - repeat CT head done, Dr. Reatha Armour consulted, no further repeat imaging needed/ monitor neuro exam C spine - cleared, no neck pain Right  first rib fracture - pain control, incentive spirometer Right clavicular head and scapula fracture - per Dr. Marlou Sa, nonop Small liver laceration/contusion - hgb stable. transaminases elevated as expected and abdominal exam benign Right Kidney Laceration - monitor abdominal exam and HGB, hematuria from kidney injury has improved Acute blood loss anemia - improved Thrombocytopenia - consumptive, improving ETOH intoxication - admit ETOH 299, CIWA, reports he does not drink daily but drinks a lot when he does FEN - miralax BID, suppository prn, reg diet VTE - lovenox ID - Ancef and Tdap Booster given in the trauma bay. Ancef 12/15>>12/17 for R knee surgery Foley: foley out 12/14, voiding well   Dispo - 4NP. Dulcolax suppository for constipation. PT/OT/ST - currently recommending SNF, TOC team is working on this. LOG SNF.    LOS: 6 days    Wellington Hampshire, [facility] 09/01/2021, 8:58 AM Please see Amion for pager number during day hours 7:00am-4:30pm

## 2021-09-02 NOTE — Progress Notes (Signed)
[facility] Progress Note  2 Days Post-Op  Subjective: CC-  Denies any pain. Tolerating diet. Denies n/v. Passing flatus, +BM.  Objective: Vital signs in last 24 hours: Temp:  [97.7 F (36.5 C)-98.7 F (37.1 C)] 98.4 F (36.9 C) (12/17 0806) Pulse Rate:  [96-100] 96 (12/17 0806) Resp:  [14-18] 18 (12/17 0806) BP: (116-149)/(66-90) 131/80 (12/17 0806) SpO2:  [93 %-100 %] 95 % (12/17 0806) Last BM Date: 09/01/21  Intake/Output from previous day: 12/16 0701 - 12/17 0700 In: 680 [P.O.:480; IV Piggyback:200] Out: 2500 [Urine:2500] Intake/Output this shift: Total I/O In: -  Out: 400 [Urine:400]  PE: Gen:  Alert, NAD, pleasant HEENT: EOM's intact, pupils chronically unequal in size R>L Card:  RRR  Pulm:  rate and effort normal on room air Abd: Soft, NT/ND Ext:  ACE wrap and KI to RLE, SCD on LLE. Splint to RUE Psych: A&Ox4  Skin: no rashes noted, warm and dry  Lab Results:  Recent Labs    08/31/21 0408  WBC 7.9  HGB 10.0*  HCT 30.0*  PLT 135*   BMET Recent Labs    08/31/21 0408  NA 137  K 4.9  CL 106  CO2 23  GLUCOSE 109*  BUN 21*  CREATININE 0.99  CALCIUM 8.2*   PT/INR No results for input(s): LABPROT, INR in the last 72 hours. CMP     Component Value Date/Time   NA 137 08/31/2021 0408   K 4.9 08/31/2021 0408   CL 106 08/31/2021 0408   CO2 23 08/31/2021 0408   GLUCOSE 109 (H) 08/31/2021 0408   BUN 21 (H) 08/31/2021 0408   CREATININE 0.99 08/31/2021 0408   CALCIUM 8.2 (L) 08/31/2021 0408   PROT 5.0 (L) 08/31/2021 0408   ALBUMIN 2.2 (L) 08/31/2021 0408   AST 110 (H) 08/31/2021 0408   ALT 132 (H) 08/31/2021 0408   ALKPHOS 194 (H) 08/31/2021 0408   BILITOT 1.1 08/31/2021 0408   GFRNONAA >60 08/31/2021 0408   Lipase     Component Value Date/Time   LIPASE 27 08/29/2021 0500       Studies/Results: No results found.  Anti-infectives: Anti-infectives (From admission, onward)    Start     Dose/Rate Route Frequency Ordered  Stop   09/01/21 1715  ceFAZolin (ANCEF) IVPB 2g/100 mL premix        2 g 200 mL/hr over 30 Minutes Intravenous Every 8 hours 09/01/21 1713 09/01/21 2345   09/01/21 0900  ceFAZolin (ANCEF) IVPB 2g/100 mL premix  Status:  Discontinued        2 g 200 mL/hr over 30 Minutes Intravenous Every 8 hours 09/01/21 0808 09/01/21 1713   08/31/21 1540  vancomycin (VANCOCIN) powder  Status:  Discontinued          As needed 08/31/21 1541 08/31/21 1639   08/31/21 0600  ceFAZolin (ANCEF) IVPB 2g/100 mL premix        2 g 200 mL/hr over 30 Minutes Intravenous To Surgery 08/30/21 1603 08/31/21 1305   08/28/21 1800  ceFAZolin (ANCEF) IVPB 2g/100 mL premix        2 g 200 mL/hr over 30 Minutes Intravenous Every 8 hours 08/28/21 1540 08/29/21 1104   08/28/21 0918  ceFAZolin (ANCEF) 2-4 GM/100ML-% IVPB       Note to Pharmacy: Elmer Sow H: cabinet override      08/28/21 0918 08/28/21 1557        Assessment/Plan PHBC Right knee dislocation - reduced by ER provider, x-rays normal post  reduction, per Dr. August Saucer, MRI shows ACL, PCL, MCL injuries and medial capsule injury. S/p open MCL and medial meniscus repair 12/15 with Dr. August Saucer. Planning staged ACL/ PCL/ posterolateral corner reconstruction in 4-6 weeks L knee injury - MRI done, per Dr. August Saucer - lateral meniscal tear, no urgent surgery Right ulna/radial fracture - S/P ORIF by Dr. Carola Frost 12/12. NWB R wrist can WB thru elbow Pelvis fracture left pubic rami, acetabulum and sacral ala - S/P SI screw x 2 and anterior plating by Dr. Carola Frost 12/12, NWB BLE x8 weeks - bed to chair transfers x8 weeks L1, L2, L4 and L5 transverse process fractures - pain control Sternal fracture - pain control TBI/small SDH/small subarachnoid hemorrhage - repeat CT head done, Dr. Jake Samples consulted, no further repeat imaging needed/ monitor neuro exam C spine - cleared, no neck pain Right first rib fracture - pain control, incentive spirometer Right clavicular head and scapula fracture - per  Dr. August Saucer, nonop Small liver laceration/contusion - hgb stable. transaminases elevated as expected and abdominal exam benign Right Kidney Laceration - monitor abdominal exam and HGB, hematuria from kidney injury has improved Acute blood loss anemia - improved Thrombocytopenia - consumptive, improving ETOH intoxication - admit ETOH 299, CIWA, reports he does not drink daily but drinks a lot when he does FEN - miralax BID, suppository prn, reg diet VTE - lovenox ID - Ancef and Tdap Booster given in the trauma bay. Ancef 12/15>>12/17 for R knee surgery Foley: foley out 12/14, voiding well   Dispo - 4NP. Dulcolax suppository for constipation. PT/OT/ST - currently recommending SNF, TOC team is working on this. LOG SNF.    LOS: 7 days   Marin Olp, MD [facility], [facility]

## 2021-09-02 NOTE — Anesthesia Postprocedure Evaluation (Signed)
Anesthesia Post Note  Patient: Raynor Calcaterra  Procedure(s) Performed: RIGHT KNEE MEDIAL COLLATERAL LIGAMENT REPAIR/RECONSTRUCTION (ALL OPEN) (Right: Knee)     Patient location during evaluation: PACU Anesthesia Type: Regional and General Level of consciousness: awake and alert Pain management: pain level controlled Vital Signs Assessment: post-procedure vital signs reviewed and stable Respiratory status: spontaneous breathing, nonlabored ventilation, respiratory function stable and patient connected to nasal cannula oxygen Cardiovascular status: blood pressure returned to baseline and stable Postop Assessment: no apparent nausea or vomiting Anesthetic complications: no   No notable events documented.  Last Vitals:  Vitals:   09/01/21 2313 09/02/21 0311  BP: 116/66 128/78  Pulse: 96 100  Resp: 17 16  Temp: 37.1 C 36.9 C  SpO2: 99% 99%    Last Pain:  Vitals:   09/02/21 0325  TempSrc:   PainSc: 0-No pain                 Aydee Mcnew S

## 2021-09-03 NOTE — Progress Notes (Signed)
[facility] Progress Note  3 Days Post-Op  Subjective: CC-  Denies any pain. Tolerating diet. Denies n/v. Passing flatus, +BM.  Objective: Vital signs in last 24 hours: Temp:  [98 F (36.7 C)-98.2 F (36.8 C)] 98.1 F (36.7 C) (12/18 0808) Pulse Rate:  [85-98] 94 (12/18 0808) Resp:  [16-18] 16 (12/18 0808) BP: (109-137)/(69-78) 109/72 (12/18 0808) SpO2:  [93 %-98 %] 96 % (12/18 0808) Last BM Date: 09/02/21  Intake/Output from previous day: 12/17 0701 - 12/18 0700 In: 720 [P.O.:720] Out: 3380 [Urine:3380] Intake/Output this shift: No intake/output data recorded.  PE: Gen:  Alert, NAD, pleasant HEENT: EOM's intact, pupils chronically unequal in size R>L Card:  RRR  Pulm:  rate and effort normal on room air Abd: Soft, NT/ND Ext:  ACE wrap and KI to RLE, SCD on LLE. Splint to RUE Psych: A&Ox4  Skin: no rashes noted, warm and dry  Lab Results:  No results for input(s): WBC, HGB, HCT, PLT in the last 72 hours.  BMET No results for input(s): NA, K, CL, CO2, GLUCOSE, BUN, CREATININE, CALCIUM in the last 72 hours.  PT/INR No results for input(s): LABPROT, INR in the last 72 hours. CMP     Component Value Date/Time   NA 137 08/31/2021 0408   K 4.9 08/31/2021 0408   CL 106 08/31/2021 0408   CO2 23 08/31/2021 0408   GLUCOSE 109 (H) 08/31/2021 0408   BUN 21 (H) 08/31/2021 0408   CREATININE 0.99 08/31/2021 0408   CALCIUM 8.2 (L) 08/31/2021 0408   PROT 5.0 (L) 08/31/2021 0408   ALBUMIN 2.2 (L) 08/31/2021 0408   AST 110 (H) 08/31/2021 0408   ALT 132 (H) 08/31/2021 0408   ALKPHOS 194 (H) 08/31/2021 0408   BILITOT 1.1 08/31/2021 0408   GFRNONAA >60 08/31/2021 0408   Lipase     Component Value Date/Time   LIPASE 27 08/29/2021 0500       Studies/Results: No results found.  Anti-infectives: Anti-infectives (From admission, onward)    Start     Dose/Rate Route Frequency Ordered Stop   09/01/21 1715  ceFAZolin (ANCEF) IVPB 2g/100 mL premix         2 g 200 mL/hr over 30 Minutes Intravenous Every 8 hours 09/01/21 1713 09/01/21 2345   09/01/21 0900  ceFAZolin (ANCEF) IVPB 2g/100 mL premix  Status:  Discontinued        2 g 200 mL/hr over 30 Minutes Intravenous Every 8 hours 09/01/21 0808 09/01/21 1713   08/31/21 1540  vancomycin (VANCOCIN) powder  Status:  Discontinued          As needed 08/31/21 1541 08/31/21 1639   08/31/21 0600  ceFAZolin (ANCEF) IVPB 2g/100 mL premix        2 g 200 mL/hr over 30 Minutes Intravenous To Surgery 08/30/21 1603 08/31/21 1305   08/28/21 1800  ceFAZolin (ANCEF) IVPB 2g/100 mL premix        2 g 200 mL/hr over 30 Minutes Intravenous Every 8 hours 08/28/21 1540 08/29/21 1104   08/28/21 0918  ceFAZolin (ANCEF) 2-4 GM/100ML-% IVPB       Note to Pharmacy: Elmer Sow H: cabinet override      08/28/21 0918 08/28/21 1557        Assessment/Plan PHBC Right knee dislocation - reduced by ER provider, x-rays normal post reduction, per Dr. Marlou Sa, MRI shows ACL, PCL, MCL injuries and medial capsule injury. S/p open MCL and medial meniscus repair 12/15 with Dr. Marlou Sa. Planning staged ACL/  PCL/ posterolateral corner reconstruction in 4-6 weeks L knee injury - MRI done, per Dr. August Saucer - lateral meniscal tear, no urgent surgery Right ulna/radial fracture - S/P ORIF by Dr. Carola Frost 12/12. NWB R wrist can WB thru elbow Pelvis fracture left pubic rami, acetabulum and sacral ala - S/P SI screw x 2 and anterior plating by Dr. Carola Frost 12/12, NWB BLE x8 weeks - bed to chair transfers x8 weeks L1, L2, L4 and L5 transverse process fractures - pain control Sternal fracture - pain control TBI/small SDH/small subarachnoid hemorrhage - repeat CT head done, Dr. Jake Samples consulted, no further repeat imaging needed/ monitor neuro exam C spine - cleared, no neck pain Right first rib fracture - pain control, incentive spirometer Right clavicular head and scapula fracture - per Dr. August Saucer, nonop Small liver laceration/contusion - hgb stable.  transaminases elevated as expected and abdominal exam benign Right Kidney Laceration - monitor abdominal exam and HGB, hematuria from kidney injury has improved Acute blood loss anemia - improved Thrombocytopenia - consumptive, improving ETOH intoxication - admit ETOH 299, CIWA, reports he does not drink daily but drinks a lot when he does FEN - miralax BID, suppository prn, reg diet VTE - lovenox ID - Ancef and Tdap Booster given in the trauma bay. Ancef 12/15>>12/17 for R knee surgery Foley: foley out 12/14, voiding well   Dispo - 4NP. PRN dulcolax suppository for constipation. PT/OT/ST - currently recommending SNF, TOC team is working on this. LOG SNF.    LOS: 8 days   Marin Olp, MD [facility], [facility]

## 2021-09-04 NOTE — Progress Notes (Signed)
°  Subjective: Patient is a 55 year old male who is s/p right knee MCL/posterior oblique ligament reconstruction with medial capsular repair.  Reports he is doing well and has very little in the way of pain in his right knee.  Denies any calf pain.  No fevers, chills, night sweats.  Denies any chest pain or shortness of breath.  He has not been bending his knee at all or ambulating on the right leg.  He has been trying to lift his leg up but has not had any success in the bed.  Objective: Vital signs in last 24 hours: Temp:  [97.3 F (36.3 C)-99 F (37.2 C)] 97.9 F (36.6 C) (12/19 1151) Pulse Rate:  [83-94] 89 (12/19 1151) Resp:  [16-20] 16 (12/19 1151) BP: (114-121)/(71-86) 114/79 (12/19 1151) SpO2:  [90 %-95 %] 93 % (12/19 1151)  Intake/Output from previous day: 12/18 0701 - 12/19 0700 In: -  Out: 1400 [Urine:1400] Intake/Output this shift: Total I/O In: -  Out: 350 [Urine:350]  Exam:  Ortho exam demonstrates right knee with 0 degrees extension.  Postop dressing intact without gross blood or drainage.  Palpable 2+ DP pulse of the right lower extremity with intact dorsiflexion and plantarflexion strength.  No calf tenderness, negative Homans' sign.  Unable to perform straight leg raise.  Labs: No results for input(s): HGB in the last 72 hours. No results for input(s): WBC, RBC, HCT, PLT in the last 72 hours. No results for input(s): NA, K, CL, CO2, BUN, CREATININE, GLUCOSE, CALCIUM in the last 72 hours. No results for input(s): LABPT, INR in the last 72 hours.  Assessment/Plan: Plan is nonweightbearing to the right lower extremity and keep the leg straight at all times.  Okay to start some knee range of motion exercises on Thursday or Friday.  Okay to weight-bear as tolerated in the hinged knee brace on the left lower extremity.  Recommended he try and contract his quad and start trying to lift his right leg up in bed to optimize his quad strength for eventual return to  weightbearing.  Also discussed future plans for surgery on the right knee with potential surgery on the left knee if the lateral ligamentous laxity continues to persist at time of neck surgery on the right knee.  Spoke with RN who states that patient's disposition is likely SNF versus CIR.   Nayah Lukens L Boleslaus Holloway 09/04/2021, 1:12 PM

## 2021-09-04 NOTE — TOC Progression Note (Addendum)
Transition of Care [facility]) - Progression Note    Patient Details  Name: Devin Jones MRN: [id number] Date of Birth: 01-02-1966  Transition of [facility]) CM/SW Contact  Astrid Drafts Berna Spare, RN Phone Number: 09/04/2021, 3:35 PM  Clinical Narrative:    With patient to discuss discharge disposition.  May have located skilled nursing facility in [place], [place], that will accept an LOG as payment.  Patient was discussed today in multidisciplinary trauma rounds; CIR plans to screen patient for possible admission.  Patient states he plans to discharge to his brother's home after rehab.  Brother is recently retired, and is at home "most of the time" per patient.  We discussed the difference between SNF and CIR: patient prefers inpatient rehab, as he states he is motivated and wants as much therapy as possible.  Will await CIR admissions coordinator screening to determine if patient eligible for inpatient rehab. Pt eager to work with therapies today.     Expected Discharge Plan: Skilled Nursing Facility Barriers to Discharge: Continued Medical Work up  Expected Discharge Plan and Services Expected Discharge Plan: Skilled Nursing Facility   Discharge Planning Services: CM Consult   Living arrangements for the past 2 months: Single Family Home                                       Social Determinants of Health (SDOH) Interventions    Readmission Risk Interventions No flowsheet data found.  Quintella Baton, RN, BSN  Trauma/Neuro ICU Case Manager [phone]

## 2021-09-04 NOTE — Progress Notes (Signed)
Patient ID: Devin Jones, male   DOB: Aug 07, 1966, 55 y.o.   MRN: [id number] Follow up - Trauma Critical Care  Patient Details:    Devin Jones is an 55 y.o. male.  Lines/tubes :   Microbiology/Sepsis markers: Results for orders placed or performed during the hospital encounter of 08/25/21  Resp Panel by RT-PCR (Flu A&B, Covid) Nasopharyngeal Swab     Status: None   Collection Time: 08/26/21 12:06 AM   Specimen: Nasopharyngeal Swab; Nasopharyngeal(NP) swabs in vial transport medium  Result Value Ref Range Status   SARS Coronavirus 2 by RT PCR NEGATIVE NEGATIVE Final    Comment: (NOTE) SARS-CoV-2 target nucleic acids are NOT DETECTED.  The SARS-CoV-2 RNA is generally detectable in upper respiratory specimens during the acute phase of infection. The lowest concentration of SARS-CoV-2 viral copies this assay can detect is 138 copies/mL. A negative result does not preclude SARS-Cov-2 infection and should not be used as the sole basis for treatment or other patient management decisions. A negative result may occur with  improper specimen collection/handling, submission of specimen other than nasopharyngeal swab, presence of viral mutation(s) within the areas targeted by this assay, and inadequate number of viral copies(<138 copies/mL). A negative result must be combined with clinical observations, patient history, and epidemiological information. The expected result is Negative.  Fact Sheet for Patients:  EntrepreneurPulse.com.au  Fact Sheet for Healthcare Providers:  IncredibleEmployment.be  This test is no t yet approved or cleared by the [place] FDA and  has been authorized for detection and/or diagnosis of SARS-CoV-2 by FDA under an Emergency Use Authorization (EUA). This EUA will remain  in effect (meaning this test can be used) for the duration of the COVID-19 declaration under Section 564(b)(1) of the Act,  21 U.S.C.section 360bbb-3(b)(1), unless the authorization is terminated  or revoked sooner.       Influenza A by PCR NEGATIVE NEGATIVE Final   Influenza B by PCR NEGATIVE NEGATIVE Final    Comment: (NOTE) The Xpert Xpress SARS-CoV-2/FLU/RSV plus assay is intended as an aid in the diagnosis of influenza from Nasopharyngeal swab specimens and should not be used as a sole basis for treatment. Nasal washings and aspirates are unacceptable for Xpert Xpress SARS-CoV-2/FLU/RSV testing.  Fact Sheet for Patients: EntrepreneurPulse.com.au  Fact Sheet for Healthcare Providers: IncredibleEmployment.be  This test is not yet approved or cleared by the [place] FDA and has been authorized for detection and/or diagnosis of SARS-CoV-2 by FDA under an Emergency Use Authorization (EUA). This EUA will remain in effect (meaning this test can be used) for the duration of the COVID-19 declaration under Section 564(b)(1) of the Act, 21 U.S.C. section 360bbb-3(b)(1), unless the authorization is terminated or revoked.  Performed at [facility], [place] [address] [place], [place] [address]   MRSA Next Gen by PCR, Nasal     Status: None   Collection Time: 08/26/21  2:46 AM   Specimen: Nasal Mucosa; Nasal Swab  Result Value Ref Range Status   MRSA by PCR Next Gen NOT DETECTED NOT DETECTED Final    Comment: (NOTE) The GeneXpert MRSA Assay (FDA approved for NASAL specimens only), is one component of a comprehensive MRSA colonization surveillance program. It is not intended to diagnose MRSA infection nor to guide or monitor treatment for MRSA infections. Test performance is not FDA approved in patients less than 61 years old. Performed at [facility], [place] [address], [place],  [address]     Anti-infectives:  Anti-infectives (From admission, onward)    Start     Dose/Rate Route Frequency Ordered Stop   09/01/21 1715  ceFAZolin (ANCEF) IVPB  2g/100 mL premix        2 g 200 mL/hr over 30 Minutes Intravenous Every 8 hours 09/01/21 1713 09/01/21 2345   09/01/21 0900  ceFAZolin (ANCEF) IVPB 2g/100 mL premix  Status:  Discontinued        2 g 200 mL/hr over 30 Minutes Intravenous Every 8 hours 09/01/21 0808 09/01/21 1713   08/31/21 1540  vancomycin (VANCOCIN) powder  Status:  Discontinued          As needed 08/31/21 1541 08/31/21 1639   08/31/21 0600  ceFAZolin (ANCEF) IVPB 2g/100 mL premix        2 g 200 mL/hr over 30 Minutes Intravenous To Surgery 08/30/21 1603 08/31/21 1305   08/28/21 1800  ceFAZolin (ANCEF) IVPB 2g/100 mL premix        2 g 200 mL/hr over 30 Minutes Intravenous Every 8 hours 08/28/21 1540 08/29/21 1104   08/28/21 0918  ceFAZolin (ANCEF) 2-4 GM/100ML-% IVPB       Note to Pharmacy: Elmer Sow H: cabinet override      08/28/21 [id number] 08/28/21 1557      Consults: Treatment Team:  Altamese Georgetown, MD    Studies:    Events:  Subjective:    Overnight Issues:   Objective:  Vital signs for last 24 hours: Temp:  [97.3 F (36.3 C)-99 F (37.2 C)] 98.1 F (36.7 C) (12/19 0414) Pulse Rate:  [83-92] 83 (12/19 0414) Resp:  [16-20] 18 (12/19 0414) BP: (115-120)/(71-80) 115/76 (12/19 0414) SpO2:  [90 %-93 %] 90 % (12/19 0414)  Hemodynamic parameters for last 24 hours:    Intake/Output from previous day: 12/18 0701 - 12/19 0700 In: -  Out: 1400 [Urine:1400]  Intake/Output this shift: No intake/output data recorded.  Vent settings for last 24 hours:    Physical Exam:  General: alert and no respiratory distress Neuro: alert and oriented HEENT/Neck: no JVD Resp: clear to auscultation bilaterally CVS: regular rate and rhythm, S1, S2 normal, no murmur, click, rub or gallop GI: soft, nontender, BS WNL, no r/g Extremities: mult ortho drsg, RLE KI, LLE brace  No results found for this or any previous visit (from the past 24 hour(s)).  Assessment & Plan: Present on Admission:  Pelvic fracture  ([place])    LOS: 9 days   Additional comments:I reviewed the patient's new clinical lab test results. Marland Kitchen  PHBC Right knee dislocation - reduced by ER provider, x-rays normal post reduction, per Dr. Marlou Sa, MRI shows ACL, PCL, MCL injuries and medial capsule injury. S/p open MCL and medial meniscus repair 12/15 with Dr. Marlou Sa. Planning staged ACL/ PCL/ posterolateral corner reconstruction in 4-6 weeks L knee injury - MRI done, per Dr. Marlou Sa - lateral meniscal tear, no urgent surgery Right ulna/radial fracture - S/P ORIF by Dr. Marcelino Scot 12/12. NWB R wrist can WB thru elbow Pelvis fracture left pubic rami, acetabulum and sacral ala - S/P SI screw x 2 and anterior plating by Dr. Marcelino Scot 12/12, NWB BLE x8 weeks - bed to chair transfers x8 weeks L1, L2, L4 and L5 transverse process fractures - pain control Sternal fracture - pain control TBI/small SDH/small subarachnoid hemorrhage - repeat CT head done, Dr. Reatha Armour consulted, no further repeat imaging needed/ monitor neuro exam C spine - cleared, no neck pain Right first rib fracture - pain control, incentive spirometer Right clavicular head  and scapula fracture - per Dr. August Saucer, nonop Small liver laceration/contusion - hgb stable. transaminases elevated as expected and abdominal exam benign Right Kidney Laceration - monitor abdominal exam and HGB, hematuria from kidney injury has improved Acute blood loss anemia - improved Thrombocytopenia - consumptive, improving ETOH intoxication - admit ETOH 299, CIWA, reports he does not drink daily but drinks a lot when he does FEN - miralax BID, suppository prn, reg diet VTE - lovenox ID - Ancef and Tdap Booster given in the trauma bay. Ancef 12/15>>12/17 for R knee surgery Foley: foley out 12/14, voiding well   Dispo - 4NP. PT/OT/ST - LOG SNF.    Violeta Gelinas, MD, MPH, FACS Trauma & General Surgery Use [email or web address] to contact on call provider  09/04/2021  *Care during the described time interval was provided by  me. I have reviewed this patient's available data, including medical history, events of note, physical examination and test results as part of my evaluation.
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
## 2021-09-05 NOTE — Progress Notes (Signed)
Patient ID: Devin Jones, male   DOB: 14-Aug-1966, 55 y.o.   MRN: [id number] 5 Days Post-Op   Subjective: No complaints, wants to go to CIR ROS negative except as listed above. Objective: Vital signs in last 24 hours: Temp:  [97.4 F (36.3 C)-98.3 F (36.8 C)] 97.9 F (36.6 C) (12/20 0803) Pulse Rate:  [81-99] 92 (12/20 0803) Resp:  [16-18] 17 (12/20 0803) BP: (112-128)/(77-89) 120/77 (12/20 0803) SpO2:  [93 %-96 %] 94 % (12/20 0803) Last BM Date: 09/03/21  Intake/Output from previous day: 12/19 0701 - 12/20 0700 In: -  Out: 1980 [Urine:1980] Intake/Output this shift: Total I/O In: 300 [P.O.:300] Out: 250 [Urine:250]  General appearance: cooperative Resp: clear to auscultation bilaterally Cardio: regular rate and rhythm GI: soft, NT Ext stable ortho drsgs/brace  Lab Results: CBC  No results for input(s): WBC, HGB, HCT, PLT in the last 72 hours. BMET No results for input(s): NA, K, CL, CO2, GLUCOSE, BUN, CREATININE, CALCIUM in the last 72 hours. PT/INR No results for input(s): LABPROT, INR in the last 72 hours. ABG No results for input(s): PHART, HCO3 in the last 72 hours.  Invalid input(s): PCO2, PO2  Studies/Results: No results found.  Anti-infectives: Anti-infectives (From admission, onward)    Start     Dose/Rate Route Frequency Ordered Stop   09/01/21 1715  ceFAZolin (ANCEF) IVPB 2g/100 mL premix        2 g 200 mL/hr over 30 Minutes Intravenous Every 8 hours 09/01/21 1713 09/01/21 2345   09/01/21 0900  ceFAZolin (ANCEF) IVPB 2g/100 mL premix  Status:  Discontinued        2 g 200 mL/hr over 30 Minutes Intravenous Every 8 hours 09/01/21 0808 09/01/21 1713   08/31/21 1540  vancomycin (VANCOCIN) powder  Status:  Discontinued          As needed 08/31/21 1541 08/31/21 1639   08/31/21 0600  ceFAZolin (ANCEF) IVPB 2g/100 mL premix        2 g 200 mL/hr over 30 Minutes Intravenous To Surgery 08/30/21 1603 08/31/21 1305   08/28/21 1800  ceFAZolin (ANCEF)  IVPB 2g/100 mL premix        2 g 200 mL/hr over 30 Minutes Intravenous Every 8 hours 08/28/21 1540 08/29/21 1104   08/28/21 0918  ceFAZolin (ANCEF) 2-4 GM/100ML-% IVPB       Note to Pharmacy: Elmer Sow H: cabinet override      08/28/21 0918 08/28/21 1557       Assessment/Plan: PHBC Right knee dislocation - reduced by ER provider, x-rays normal post reduction, per Dr. Marlou Sa, MRI shows ACL, PCL, MCL injuries and medial capsule injury. S/p open MCL and medial meniscus repair 12/15 with Dr. Marlou Sa. Planning staged ACL/ PCL/ posterolateral corner reconstruction in 4-6 weeks L knee injury - MRI done, per Dr. Marlou Sa - lateral meniscal tear, no urgent surgery Right ulna/radial fracture - S/P ORIF by Dr. Marcelino Scot 12/12. NWB R wrist can WB thru elbow Pelvis fracture left pubic rami, acetabulum and sacral ala - S/P SI screw x 2 and anterior plating by Dr. Marcelino Scot 12/12, NWB BLE x8 weeks - bed to chair transfers x8 weeks L1, L2, L4 and L5 transverse process fractures - pain control Sternal fracture - pain control TBI/small SDH/small subarachnoid hemorrhage - repeat CT head done, Dr. Reatha Armour consulted, no further repeat imaging needed/ monitor neuro exam C spine - cleared, no neck pain Right first rib fracture - pain control, incentive spirometer Right clavicular head and scapula fracture -  per Dr. August Saucer, nonop Small liver laceration/contusion - hgb stable. transaminases elevated as expected and abdominal exam benign Right Kidney Laceration - monitor abdominal exam and HGB, hematuria from kidney injury has improved Acute blood loss anemia - improved Thrombocytopenia - consumptive, improving ETOH intoxication - admit ETOH 299, CIWA, reports he does not drink daily but drinks a lot when he does FEN - miralax BID, suppository prn, reg diet VTE - lovenox ID - Ancef and Tdap Booster given in the trauma bay. Ancef 12/15>>12/17 for R knee surgery Foley: foley out 12/14, voiding well   Dispo - 4NP. PT/OT/ST -  hopefully CIR. He is medically ready for D/C to CIR.  LOS: 10 days    Violeta Gelinas, MD, MPH, FACS Trauma & General Surgery Use [email or web address] to contact on call provider  09/05/2021

## 2021-09-05 NOTE — Progress Notes (Signed)
Inpatient Rehab Admissions Coordinator:   Per request, pt was screened for CIR appropriateness by Estill Dooms, PT, DPT.  At this time we are recommending a CIR consult and I will place an order per our protocol.  Estill Dooms, PT, DPT Admissions Coordinator [phone] 09/05/21  8:45 AM
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
## 2021-09-05 NOTE — Progress Notes (Signed)
Inpatient Rehab Admissions Coordinator:   I met with patient at the bedside to discuss CIR recommendations and goals/expectations of CIR stay.  We talked about ELOS 2 weeks or so, depending on progress, with goals of intermittent mod I w/c level.  Pt will have to work hard with therapy in order to accomplish that being BLE NWB and RUE WB through elbow only.  He plans to discharge to his brother's home and states he brother will be there most of the time.  I will call brother tomorrow to discuss dispo and answer questions (having an outpatient medical procedure today).  Pt and I also discussed cost of CIR and he is agreeable to proceed.  Appears that medicaid application has been started.  I will continue to follow.   Shann Medal, PT, DPT Admissions Coordinator [phone] 09/05/21  2:37 PM

## 2021-09-06 NOTE — Progress Notes (Signed)
Inpatient Rehab Admissions Coordinator:   Left message for pt's brother to discuss dispo.   Estill Dooms, PT, DPT Admissions Coordinator [phone] 09/06/21  12:23 PM
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
## 2021-09-06 NOTE — Progress Notes (Signed)
Patient ID: Devin Jones, male   DOB: 09-Jan-1966, 55 y.o.   MRN: [id number] 6 Days Post-Op   Subjective: Pain well controlled. Having BM this am. Tolerating diet. No respiratory complaints  Objective: Vital signs in last 24 hours: Temp:  [97.9 F (36.6 C)-98.6 F (37 C)] 98.4 F (36.9 C) (12/21 0727) Pulse Rate:  [85-98] 85 (12/21 0727) Resp:  [13-16] 16 (12/21 0727) BP: (113-133)/(68-77) 116/74 (12/21 0727) SpO2:  [94 %-96 %] 95 % (12/21 0727) Last BM Date: 09/02/21  Intake/Output from previous day: 12/20 0701 - 12/21 0700 In: 776 [P.O.:776] Out: 1400 [Urine:1400] Intake/Output this shift: No intake/output data recorded.  General appearance: cooperative, laying in bed comfortably, NAD Resp: clear to auscultation bilaterally, effort non labored on room air Cardio: regular rate and rhythm GI: soft, NT Ext stable ortho drsgs/brace  Lab Results: CBC  No results for input(s): WBC, HGB, HCT, PLT in the last 72 hours. BMET No results for input(s): NA, K, CL, CO2, GLUCOSE, BUN, CREATININE, CALCIUM in the last 72 hours. PT/INR No results for input(s): LABPROT, INR in the last 72 hours. ABG No results for input(s): PHART, HCO3 in the last 72 hours.  Invalid input(s): PCO2, PO2  Studies/Results: No results found.  Anti-infectives: Anti-infectives (From admission, onward)    Start     Dose/Rate Route Frequency Ordered Stop   09/01/21 1715  ceFAZolin (ANCEF) IVPB 2g/100 mL premix        2 g 200 mL/hr over 30 Minutes Intravenous Every 8 hours 09/01/21 1713 09/01/21 2345   09/01/21 0900  ceFAZolin (ANCEF) IVPB 2g/100 mL premix  Status:  Discontinued        2 g 200 mL/hr over 30 Minutes Intravenous Every 8 hours 09/01/21 0808 09/01/21 1713   08/31/21 1540  vancomycin (VANCOCIN) powder  Status:  Discontinued          As needed 08/31/21 1541 08/31/21 1639   08/31/21 0600  ceFAZolin (ANCEF) IVPB 2g/100 mL premix        2 g 200 mL/hr over 30 Minutes Intravenous To  Surgery 08/30/21 1603 08/31/21 1305   08/28/21 1800  ceFAZolin (ANCEF) IVPB 2g/100 mL premix        2 g 200 mL/hr over 30 Minutes Intravenous Every 8 hours 08/28/21 1540 08/29/21 1104   08/28/21 0918  ceFAZolin (ANCEF) 2-4 GM/100ML-% IVPB       Note to Pharmacy: Elmer Sow H: cabinet override      08/28/21 0918 08/28/21 1557       Assessment/Plan: PHBC Right knee dislocation - reduced by ER provider, x-rays normal post reduction, per Dr. Marlou Sa, MRI shows ACL, PCL, MCL injuries and medial capsule injury. S/p open MCL and medial meniscus repair 12/15 with Dr. Marlou Sa. Planning staged ACL/ PCL/ posterolateral corner reconstruction in 4-6 weeks L knee injury - MRI done, per Dr. Marlou Sa - lateral meniscal tear, no urgent surgery Right ulna/radial fracture - S/P ORIF by Dr. Marcelino Scot 12/12. NWB R wrist can WB thru elbow Pelvis fracture left pubic rami, acetabulum and sacral ala - S/P SI screw x 2 and anterior plating by Dr. Marcelino Scot 12/12, NWB BLE x8 weeks - bed to chair transfers x8 weeks L1, L2, L4 and L5 transverse process fractures - pain control Sternal fracture - pain control TBI/small SDH/small subarachnoid hemorrhage - repeat CT head done, Dr. Reatha Armour consulted, no further repeat imaging needed/ monitor neuro exam C spine - cleared, no neck pain Right first rib fracture - pain control, incentive spirometer  Right clavicular head and scapula fracture - per Dr. August Saucer, nonop Small liver laceration/contusion - hgb stable. transaminases elevated as expected and abdominal exam benign Right Kidney Laceration - monitor abdominal exam and HGB, hematuria from kidney injury has improved Acute blood loss anemia - improved Thrombocytopenia - consumptive, improving ETOH intoxication - admit ETOH 299, CIWA, reports he does not drink daily but drinks a lot when he does FEN - miralax BID, suppository prn, reg diet VTE - lovenox - ortho reccs 6 weeks total of anticoagulation due to immobility and constellation  injuries ID - Ancef and Tdap Booster given in the trauma bay. Ancef 12/15>>12/17 for R knee surgery Foley: foley out 12/14, voiding well   Dispo - 4NP. PT/OT/ST - hopefully CIR. He is medically ready for D/C to CIR.   LOS: 11 days    Eric Form, [facility] 09/06/2021, 10:11 AM Please see Amion for pager number during day hours 7:00am-4:30pm   09/06/2021

## 2021-09-07 ENCOUNTER — Other Ambulatory Visit: Payer: Self-pay

## 2021-09-07 ENCOUNTER — Encounter (HOSPITAL_COMMUNITY): Payer: Self-pay | Admitting: Physical Medicine & Rehabilitation

## 2021-09-07 ENCOUNTER — Inpatient Hospital Stay (HOSPITAL_COMMUNITY)
Admission: RE | Admit: 2021-09-07 | Discharge: 2021-09-28 | DRG: 945 | Disposition: A | Discharge status: Home / Self Care | Payer: Medicaid Other | Source: Intra-hospital | Attending: Physical Medicine & Rehabilitation | Admitting: Physical Medicine & Rehabilitation

## 2021-09-07 ENCOUNTER — Encounter (HOSPITAL_COMMUNITY): Payer: Self-pay

## 2021-09-07 DIAGNOSIS — S32810S Multiple fractures of pelvis with stable disruption of pelvic ring, sequela: Secondary | ICD-10-CM | POA: Diagnosis not present

## 2021-09-07 DIAGNOSIS — S42101D Fracture of unspecified part of scapula, right shoulder, subsequent encounter for fracture with routine healing: Secondary | ICD-10-CM

## 2021-09-07 DIAGNOSIS — S83282D Other tear of lateral meniscus, current injury, left knee, subsequent encounter: Secondary | ICD-10-CM | POA: Diagnosis not present

## 2021-09-07 DIAGNOSIS — S83511D Sprain of anterior cruciate ligament of right knee, subsequent encounter: Secondary | ICD-10-CM

## 2021-09-07 DIAGNOSIS — S52371D Galeazzi's fracture of right radius, subsequent encounter for closed fracture with routine healing: Secondary | ICD-10-CM | POA: Diagnosis not present

## 2021-09-07 DIAGNOSIS — R748 Abnormal levels of other serum enzymes: Secondary | ICD-10-CM | POA: Diagnosis present

## 2021-09-07 DIAGNOSIS — M898X9 Other specified disorders of bone, unspecified site: Secondary | ICD-10-CM | POA: Diagnosis present

## 2021-09-07 DIAGNOSIS — S83104D Unspecified dislocation of right knee, subsequent encounter: Secondary | ICD-10-CM

## 2021-09-07 DIAGNOSIS — K5900 Constipation, unspecified: Secondary | ICD-10-CM | POA: Diagnosis present

## 2021-09-07 DIAGNOSIS — S069XAA Unspecified intracranial injury with loss of consciousness status unknown, initial encounter: Secondary | ICD-10-CM | POA: Diagnosis present

## 2021-09-07 DIAGNOSIS — Z23 Encounter for immunization: Secondary | ICD-10-CM | POA: Diagnosis not present

## 2021-09-07 DIAGNOSIS — D62 Acute posthemorrhagic anemia: Secondary | ICD-10-CM | POA: Diagnosis not present

## 2021-09-07 DIAGNOSIS — S065XAD Traumatic subdural hemorrhage with loss of consciousness status unknown, subsequent encounter: Principal | ICD-10-CM

## 2021-09-07 DIAGNOSIS — Z8249 Family history of ischemic heart disease and other diseases of the circulatory system: Secondary | ICD-10-CM

## 2021-09-07 DIAGNOSIS — E8809 Other disorders of plasma-protein metabolism, not elsewhere classified: Secondary | ICD-10-CM | POA: Diagnosis present

## 2021-09-07 DIAGNOSIS — T07XXXA Unspecified multiple injuries, initial encounter: Secondary | ICD-10-CM | POA: Diagnosis not present

## 2021-09-07 DIAGNOSIS — S2231XD Fracture of one rib, right side, subsequent encounter for fracture with routine healing: Secondary | ICD-10-CM

## 2021-09-07 DIAGNOSIS — R739 Hyperglycemia, unspecified: Secondary | ICD-10-CM

## 2021-09-07 DIAGNOSIS — Z841 Family history of disorders of kidney and ureter: Secondary | ICD-10-CM | POA: Diagnosis not present

## 2021-09-07 DIAGNOSIS — S32810D Multiple fractures of pelvis with stable disruption of pelvic ring, subsequent encounter for fracture with routine healing: Secondary | ICD-10-CM

## 2021-09-07 DIAGNOSIS — S069X2S Unspecified intracranial injury with loss of consciousness of 31 minutes to 59 minutes, sequela: Secondary | ICD-10-CM | POA: Diagnosis not present

## 2021-09-07 DIAGNOSIS — D696 Thrombocytopenia, unspecified: Secondary | ICD-10-CM | POA: Diagnosis present

## 2021-09-07 DIAGNOSIS — S42001D Fracture of unspecified part of right clavicle, subsequent encounter for fracture with routine healing: Secondary | ICD-10-CM | POA: Diagnosis not present

## 2021-09-07 DIAGNOSIS — S83512D Sprain of anterior cruciate ligament of left knee, subsequent encounter: Secondary | ICD-10-CM

## 2021-09-07 DIAGNOSIS — S066XAD Traumatic subarachnoid hemorrhage with loss of consciousness status unknown, subsequent encounter: Secondary | ICD-10-CM

## 2021-09-07 DIAGNOSIS — S2220XD Unspecified fracture of sternum, subsequent encounter for fracture with routine healing: Secondary | ICD-10-CM

## 2021-09-07 DIAGNOSIS — L259 Unspecified contact dermatitis, unspecified cause: Secondary | ICD-10-CM

## 2021-09-07 DIAGNOSIS — F1010 Alcohol abuse, uncomplicated: Secondary | ICD-10-CM | POA: Diagnosis present

## 2021-09-07 DIAGNOSIS — Z823 Family history of stroke: Secondary | ICD-10-CM

## 2021-09-07 DIAGNOSIS — S32009D Unspecified fracture of unspecified lumbar vertebra, subsequent encounter for fracture with routine healing: Secondary | ICD-10-CM | POA: Diagnosis not present

## 2021-09-07 DIAGNOSIS — S062X1S Diffuse traumatic brain injury with loss of consciousness of 30 minutes or less, sequela: Secondary | ICD-10-CM | POA: Diagnosis not present

## 2021-09-07 DIAGNOSIS — S069X1D Unspecified intracranial injury with loss of consciousness of 30 minutes or less, subsequent encounter: Secondary | ICD-10-CM

## 2021-09-07 DIAGNOSIS — Z800 Family history of malignant neoplasm of digestive organs: Secondary | ICD-10-CM | POA: Diagnosis not present

## 2021-09-07 DIAGNOSIS — S329XXA Fracture of unspecified parts of lumbosacral spine and pelvis, initial encounter for closed fracture: Secondary | ICD-10-CM | POA: Diagnosis present

## 2021-09-07 DIAGNOSIS — R7989 Other specified abnormal findings of blood chemistry: Secondary | ICD-10-CM | POA: Diagnosis not present

## 2021-09-07 MED ORDER — ACETAMINOPHEN 325 MG PO TABS
325.0000 mg | ORAL_TABLET | ORAL | Status: DC | PRN
  Administered 2021-09-13 (×2): 650 mg via ORAL
  Administered 2021-09-23: 325 mg via ORAL
  Filled 2021-09-07 (×3): qty 2

## 2021-09-07 MED ORDER — OXYCODONE HCL 5 MG PO TABS: 10.0000 mg | ORAL_TABLET | ORAL | Status: DC | PRN

## 2021-09-07 MED ORDER — DOCUSATE SODIUM 100 MG PO CAPS: 100.0000 mg | ORAL_CAPSULE | Freq: Two times a day (BID) | ORAL | Status: DC

## 2021-09-07 MED ORDER — PROCHLORPERAZINE EDISYLATE 10 MG/2ML IJ SOLN
5.0000 mg | Freq: Four times a day (QID) | INTRAMUSCULAR | Status: DC | PRN
Start: 2021-09-07 — End: 2021-09-28

## 2021-09-07 MED ORDER — ADULT MULTIVITAMIN W/MINERALS CH
1.0000 | ORAL_TABLET | Freq: Every day | ORAL | Status: DC
  Administered 2021-09-08 – 2021-09-27 (×20): 1 via ORAL
  Filled 2021-09-07 (×21): qty 1

## 2021-09-07 MED ORDER — FOLIC ACID 1 MG PO TABS
1.0000 mg | ORAL_TABLET | Freq: Every day | ORAL | Status: DC
  Administered 2021-09-08 – 2021-09-27 (×20): 1 mg via ORAL
  Filled 2021-09-07 (×21): qty 1

## 2021-09-07 MED ORDER — INFLUENZA VAC SPLIT QUAD 0.5 ML IM SUSY
0.5000 mL | PREFILLED_SYRINGE | INTRAMUSCULAR | Status: AC
  Administered 2021-09-08: 09:00:00 0.5 mL via INTRAMUSCULAR

## 2021-09-07 MED ORDER — SENNOSIDES-DOCUSATE SODIUM 8.6-50 MG PO TABS: 1.0000 | ORAL_TABLET | Freq: Every day | ORAL | Status: DC

## 2021-09-07 MED ORDER — DIPHENHYDRAMINE HCL 12.5 MG/5ML PO ELIX: 12.5000 mg | ORAL_SOLUTION | Freq: Four times a day (QID) | ORAL | Status: DC | PRN

## 2021-09-07 MED ORDER — THIAMINE HCL 100 MG PO TABS
100.0000 mg | ORAL_TABLET | Freq: Every day | ORAL | Status: DC
  Administered 2021-09-08 – 2021-09-27 (×19): 100 mg via ORAL
  Filled 2021-09-07 (×21): qty 1

## 2021-09-07 MED ORDER — FLEET ENEMA 7-19 GM/118ML RE ENEM: 1.0000 | ENEMA | Freq: Once | RECTAL | Status: DC | PRN

## 2021-09-07 MED ORDER — PROCHLORPERAZINE MALEATE 5 MG PO TABS: 5.0000 mg | ORAL_TABLET | Freq: Four times a day (QID) | ORAL | Status: DC | PRN

## 2021-09-07 MED ORDER — ENOXAPARIN SODIUM 40 MG/0.4ML IJ SOSY
40.0000 mg | PREFILLED_SYRINGE | INTRAMUSCULAR | Status: DC
  Administered 2021-09-08 – 2021-09-26 (×19): 40 mg via SUBCUTANEOUS
  Filled 2021-09-07 (×19): qty 0.4

## 2021-09-07 MED ORDER — SENNOSIDES-DOCUSATE SODIUM 8.6-50 MG PO TABS
2.0000 | ORAL_TABLET | Freq: Every day | ORAL | Status: DC
  Administered 2021-09-07 – 2021-09-27 (×18): 2 via ORAL
  Filled 2021-09-07 (×19): qty 2

## 2021-09-07 MED ORDER — TRAZODONE HCL 50 MG PO TABS
25.0000 mg | ORAL_TABLET | Freq: Every evening | ORAL | Status: DC | PRN
Start: 2021-09-07 — End: 2021-09-28
  Administered 2021-09-10: 22:00:00 50 mg via ORAL
  Filled 2021-09-07: qty 1

## 2021-09-07 MED ORDER — BISACODYL 10 MG RE SUPP: 10.0000 mg | Freq: Every day | RECTAL | Status: DC | PRN

## 2021-09-07 MED ORDER — ENOXAPARIN SODIUM 40 MG/0.4ML IJ SOSY: 40.0000 mg | PREFILLED_SYRINGE | INTRAMUSCULAR | Status: DC

## 2021-09-07 MED ORDER — PROCHLORPERAZINE 25 MG RE SUPP: 12.5000 mg | Freq: Four times a day (QID) | RECTAL | Status: DC | PRN

## 2021-09-07 MED ORDER — POLYETHYLENE GLYCOL 3350 17 G PO PACK
17.0000 g | PACK | Freq: Two times a day (BID) | ORAL | Status: DC
  Administered 2021-09-07 – 2021-09-27 (×30): 17 g via ORAL
  Filled 2021-09-07 (×39): qty 1

## 2021-09-07 MED ORDER — GUAIFENESIN-DM 100-10 MG/5ML PO SYRP: 5.0000 mL | ORAL_SOLUTION | Freq: Four times a day (QID) | ORAL | Status: DC | PRN

## 2021-09-07 MED ORDER — MENTHOL 3 MG MT LOZG
1.0000 | LOZENGE | OROMUCOSAL | Status: DC | PRN
  Filled 2021-09-07: qty 9

## 2021-09-07 MED ORDER — SIMETHICONE 80 MG PO CHEW: 80.0000 mg | CHEWABLE_TABLET | Freq: Four times a day (QID) | ORAL | Status: DC | PRN

## 2021-09-07 MED ORDER — POLYETHYLENE GLYCOL 3350 17 G PO PACK: 17.0000 g | PACK | Freq: Every day | ORAL | Status: DC | PRN

## 2021-09-07 MED ORDER — SELENIUM SULFIDE 1 % EX LOTN
TOPICAL_LOTION | Freq: Every day | CUTANEOUS | Status: DC
  Filled 2021-09-07 (×2): qty 207

## 2021-09-07 MED ORDER — VITAMIN D 25 MCG (1000 UNIT) PO TABS
2000.0000 [IU] | ORAL_TABLET | Freq: Two times a day (BID) | ORAL | Status: DC
  Administered 2021-09-07 – 2021-09-27 (×41): 2000 [IU] via ORAL
  Filled 2021-09-07 (×42): qty 2

## 2021-09-07 MED ORDER — ALUM & MAG HYDROXIDE-SIMETH 200-200-20 MG/5ML PO SUSP
30.0000 mL | ORAL | Status: DC | PRN
  Filled 2021-09-07: qty 30

## 2021-09-07 MED ORDER — METHOCARBAMOL 500 MG PO TABS
500.0000 mg | ORAL_TABLET | Freq: Four times a day (QID) | ORAL | Status: DC | PRN
  Administered 2021-09-08 – 2021-09-21 (×6): 500 mg via ORAL
  Filled 2021-09-07 (×6): qty 1

## 2021-09-07 MED ORDER — THIAMINE HCL 100 MG/ML IJ SOLN
100.0000 mg | Freq: Every day | INTRAMUSCULAR | Status: DC
  Administered 2021-09-11: 08:00:00 100 mg via INTRAVENOUS
  Filled 2021-09-07 (×7): qty 2

## 2021-09-07 MED ORDER — ACETAMINOPHEN 325 MG PO TABS
650.0000 mg | ORAL_TABLET | Freq: Four times a day (QID) | ORAL | Status: DC
  Administered 2021-09-07 – 2021-09-28 (×80): 650 mg via ORAL
  Filled 2021-09-07 (×78): qty 2

## 2021-09-07 MED ORDER — GABAPENTIN 300 MG PO CAPS
300.0000 mg | ORAL_CAPSULE | Freq: Three times a day (TID) | ORAL | Status: DC
  Administered 2021-09-07 – 2021-09-28 (×62): 300 mg via ORAL
  Filled 2021-09-07 (×62): qty 1

## 2021-09-07 MED ORDER — ASCORBIC ACID 500 MG PO TABS
500.0000 mg | ORAL_TABLET | Freq: Every day | ORAL | Status: DC
  Administered 2021-09-08 – 2021-09-27 (×20): 500 mg via ORAL
  Filled 2021-09-07 (×21): qty 1
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
## 2021-09-07 NOTE — TOC Transition Note (Signed)
Transition of Care [facility]) - CM/SW Discharge Note   Patient Details  Name: Devin Jones MRN: [id number] Date of Birth: 1966/06/18  [facility] CM/SW Contact:  Glennon Mac, RN Phone Number: 09/07/2021, 3:26 PM   Clinical Narrative:    Pt has been accepted for admission to [facility] today, and is medically stable for discharge.  Plan dc to CIR once bed available.    Final next level of care: IP Rehab Facility Barriers to Discharge: Barriers Resolved   Patient Goals and CMS Choice Patient states their goals for this hospitalization and ongoing recovery are:: to go home CMS Medicare.gov Compare Post Acute Care list provided to:: Patient Choice offered to / list presented to : Patient           Discharge Plan and Services   Discharge Planning Services: CM Consult Post Acute Care Choice: IP Rehab                               Social Determinants of Health (SDOH) Interventions     Readmission Risk Interventions No flowsheet data found.  Quintella Baton, RN, BSN  Trauma/Neuro ICU Case Manager [phone]
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
## 2021-09-07 NOTE — Progress Notes (Signed)
Patient ID: Devin Jones, male   DOB: 03-Jul-1966, 55 y.o.   MRN: [id number] 7 Days Post-Op   Subjective: Pain well controlled. No acute issues.  Objective: Vital signs in last 24 hours: Temp:  [98 F (36.7 C)-99.7 F (37.6 C)] 98.1 F (36.7 C) (12/22 0809) Pulse Rate:  [85-94] 88 (12/22 0809) Resp:  [16-20] 18 (12/22 0809) BP: (105-138)/(58-85) 124/78 (12/22 0809) SpO2:  [94 %-100 %] 100 % (12/22 0809) Last BM Date: 09/06/21  Intake/Output from previous day: 12/21 0701 - 12/22 0700 In: 720 [P.O.:720] Out: 1550 [Urine:1550] Intake/Output this shift: Total I/O In: 240 [P.O.:240] Out: -   General appearance: cooperative, laying in bed comfortably, NAD Resp: clear to auscultation bilaterally, effort non labored on room air Cardio: regular rate and rhythm GI: soft, NT Ext stable ortho drsgs/brace  Lab Results: CBC  No results for input(s): WBC, HGB, HCT, PLT in the last 72 hours. BMET No results for input(s): NA, K, CL, CO2, GLUCOSE, BUN, CREATININE, CALCIUM in the last 72 hours. PT/INR No results for input(s): LABPROT, INR in the last 72 hours. ABG No results for input(s): PHART, HCO3 in the last 72 hours.  Invalid input(s): PCO2, PO2  Studies/Results: No results found.  Anti-infectives: Anti-infectives (From admission, onward)    Start     Dose/Rate Route Frequency Ordered Stop   09/01/21 1715  ceFAZolin (ANCEF) IVPB 2g/100 mL premix        2 g 200 mL/hr over 30 Minutes Intravenous Every 8 hours 09/01/21 1713 09/01/21 2345   09/01/21 0900  ceFAZolin (ANCEF) IVPB 2g/100 mL premix  Status:  Discontinued        2 g 200 mL/hr over 30 Minutes Intravenous Every 8 hours 09/01/21 0808 09/01/21 1713   08/31/21 1540  vancomycin (VANCOCIN) powder  Status:  Discontinued          As needed 08/31/21 1541 08/31/21 1639   08/31/21 0600  ceFAZolin (ANCEF) IVPB 2g/100 mL premix        2 g 200 mL/hr over 30 Minutes Intravenous To Surgery 08/30/21 1603 08/31/21 1305    08/28/21 1800  ceFAZolin (ANCEF) IVPB 2g/100 mL premix        2 g 200 mL/hr over 30 Minutes Intravenous Every 8 hours 08/28/21 1540 08/29/21 1104   08/28/21 0918  ceFAZolin (ANCEF) 2-4 GM/100ML-% IVPB       Note to Pharmacy: Oswaldo Milian H: cabinet override      08/28/21 0918 08/28/21 1557       Assessment/Plan: PHBC Right knee dislocation - reduced by ER provider, x-rays normal post reduction, per Dr. August Saucer, MRI shows ACL, PCL, MCL injuries and medial capsule injury. S/p open MCL and medial meniscus repair 12/15 with Dr. August Saucer. Planning staged ACL/ PCL/ posterolateral corner reconstruction in 4-6 weeks L knee injury - MRI done, per Dr. August Saucer - lateral meniscal tear, no urgent surgery Right ulna/radial fracture - S/P ORIF by Dr. Carola Frost 12/12. NWB R wrist can WB thru elbow Pelvis fracture left pubic rami, acetabulum and sacral ala - S/P SI screw x 2 and anterior plating by Dr. Carola Frost 12/12, NWB BLE x8 weeks - bed to chair transfers x8 weeks L1, L2, L4 and L5 transverse process fractures - pain control Sternal fracture - pain control TBI/small SDH/small subarachnoid hemorrhage - repeat CT head done, Dr. Jake Samples consulted, no further repeat imaging needed/ monitor neuro exam C spine - cleared, no neck pain Right first rib fracture - pain control, incentive spirometer Right clavicular  head and scapula fracture - per Dr. Marlou Sa, nonop Small liver laceration/contusion  Right Kidney Laceration - hematuria from kidney injury has improved Acute blood loss anemia - improved Thrombocytopenia - consumptive, resolved FEN - miralax BID, suppository prn, reg diet VTE - lovenox - ortho reccs 6 weeks total of anticoagulation due to immobility and constellation injuries ID - Ancef and Tdap Booster given in the trauma bay. Ancef 12/15>>12/17 for R knee surgery   Dispo - 4NP. PT/OT/ST - hopefully CIR. He is medically ready for D/C to CIR.   LOS: 12 days    Dwan Bolt, [facility] 09/07/2021, 8:25 AM Please see Amion for pager number during day hours 7:00am-4:30pm
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
## 2021-09-07 NOTE — Progress Notes (Signed)
Patient arrived on unit. Rehab schedule, medications and plan of care reviewed, patient states an understanding. Patient is Ax4 and has no complications noted at this time. Patient reports pain 0 out of 10. Patient educated on use of call light. Devin Jones Transue  
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
## 2021-09-08 DIAGNOSIS — S32810S Multiple fractures of pelvis with stable disruption of pelvic ring, sequela: Secondary | ICD-10-CM

## 2021-09-08 DIAGNOSIS — D62 Acute posthemorrhagic anemia: Secondary | ICD-10-CM

## 2021-09-08 DIAGNOSIS — T07XXXA Unspecified multiple injuries, initial encounter: Secondary | ICD-10-CM

## 2021-09-08 DIAGNOSIS — S069X2S Unspecified intracranial injury with loss of consciousness of 31 minutes to 59 minutes, sequela: Secondary | ICD-10-CM

## 2021-09-08 LAB — CBC WITH DIFFERENTIAL/PLATELET
Abs Immature Granulocytes: 0.13 10*3/uL — ABNORMAL HIGH (ref 0.00–0.07)
Basophils Absolute: 0.1 10*3/uL (ref 0.0–0.1)
Basophils Relative: 1 %
Eosinophils Absolute: 0 10*3/uL (ref 0.0–0.5)
Eosinophils Relative: 0 %
HCT: 34.2 % — ABNORMAL LOW (ref 39.0–52.0)
Hemoglobin: 11.4 g/dL — ABNORMAL LOW (ref 13.0–17.0)
Immature Granulocytes: 1 %
Lymphocytes Relative: 15 %
Lymphs Abs: 1.4 10*3/uL (ref 0.7–4.0)
MCH: 30.3 pg (ref 26.0–34.0)
MCHC: 33.3 g/dL (ref 30.0–36.0)
MCV: 91 fL (ref 80.0–100.0)
Monocytes Absolute: 0.7 10*3/uL (ref 0.1–1.0)
Monocytes Relative: 8 %
Neutro Abs: 7 10*3/uL (ref 1.7–7.7)
Neutrophils Relative %: 75 %
Platelets: 450 10*3/uL — ABNORMAL HIGH (ref 150–400)
RBC: 3.76 MIL/uL — ABNORMAL LOW (ref 4.22–5.81)
RDW: 14.4 % (ref 11.5–15.5)
WBC: 9.4 10*3/uL (ref 4.0–10.5)
nRBC: 0 % (ref 0.0–0.2)

## 2021-09-08 LAB — HEMOGLOBIN A1C
Hgb A1c MFr Bld: 5.5 % (ref 4.8–5.6)
Mean Plasma Glucose: 111.15 mg/dL

## 2021-09-08 LAB — COMPREHENSIVE METABOLIC PANEL
ALT: 119 U/L — ABNORMAL HIGH (ref 0–44)
AST: 44 U/L — ABNORMAL HIGH (ref 15–41)
Albumin: 2.7 g/dL — ABNORMAL LOW (ref 3.5–5.0)
Alkaline Phosphatase: 396 U/L — ABNORMAL HIGH (ref 38–126)
Anion gap: 9 (ref 5–15)
BUN: 19 mg/dL (ref 6–20)
CO2: 24 mmol/L (ref 22–32)
Calcium: 9 mg/dL (ref 8.9–10.3)
Chloride: 103 mmol/L (ref 98–111)
Creatinine, Ser: 0.77 mg/dL (ref 0.61–1.24)
GFR, Estimated: >60 mL/min (ref >60)
Glucose, Bld: 106 mg/dL — ABNORMAL HIGH (ref 70–99)
Potassium: 4.3 mmol/L (ref 3.5–5.1)
Sodium: 136 mmol/L (ref 135–145)
Total Bilirubin: 0.9 mg/dL (ref 0.3–1.2)
Total Protein: 6.1 g/dL — ABNORMAL LOW (ref 6.5–8.1)

## 2021-09-08 NOTE — Plan of Care (Signed)
°  Problem: RH Balance Goal: LTG: Patient will maintain dynamic sitting balance (OT) Description: LTG:  Patient will maintain dynamic sitting balance with assistance during activities of daily living (OT) Flowsheets (Taken 09/08/2021 1022) LTG: Pt will maintain dynamic sitting balance during ADLs with: Supervision/Verbal cueing   Problem: RH Grooming Goal: LTG Patient will perform grooming w/assist,cues/equip (OT) Description: LTG: Patient will perform grooming with assist, with/without cues using equipment (OT) Flowsheets (Taken 09/08/2021 1022) LTG: Pt will perform grooming with assistance level of: Independent  Problem: RH Bathing Goal: LTG Patient will bathe all body parts with assist levels (OT) Description: LTG: Patient will bathe all body parts with assist levels (OT) Flowsheets (Taken 09/08/2021 1022) LTG: Pt will perform bathing with assistance level/cueing: Minimal Assistance - Patient > 75%   Problem: RH Dressing Goal: LTG Patient will perform upper body dressing (OT) Description: LTG Patient will perform upper body dressing with assist, with/without cues (OT). Flowsheets (Taken 09/08/2021 1022) LTG: Pt will perform upper body dressing with assistance level of: Minimal Assistance - Patient > 75%   Problem: RH Toileting Goal: LTG Patient will perform toileting task (3/3 steps) with assistance level (OT) Description: LTG: Patient will perform toileting task (3/3 steps) with assistance level (OT)  Flowsheets (Taken 09/08/2021 1022) LTG: Pt will perform toileting task (3/3 steps) with assistance level: Minimal Assistance - Patient > 75%   Problem: RH Toilet Transfers Goal: LTG Patient will perform toilet transfers w/assist (OT) Description: LTG: Patient will perform toilet transfers with assist, with/without cues using equipment (OT) Flowsheets (Taken 09/08/2021 1022) LTG: Pt will perform toilet transfers with assistance level of: Minimal Assistance - Patient > 75%
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
## 2021-09-08 NOTE — Progress Notes (Signed)
Inpatient Rehabilitation  Patient information reviewed and entered into eRehab system by Mikaeel Petrow M. Bruchy Mikel, M.A., CCC/SLP, PPS Coordinator.  Information including medical coding, functional ability and quality indicators will be reviewed and updated through discharge.    

## 2021-09-08 NOTE — Progress Notes (Signed)
Inpatient Rehabilitation Admission Medication Review by a Pharmacist  A complete drug regimen review was completed for this patient to identify any potential clinically significant medication issues.  High Risk Drug Classes Is patient taking? Indication by Medication  Antipsychotic Yes Compazine for N/V  Anticoagulant Yes Lovenox for VTE ppx  Antibiotic No   Opioid Yes Oxycodone for pain  Antiplatelet No   Hypoglycemics/insulin No   Vasoactive Medication No   Chemotherapy No   Other No      Type of Medication Issue Identified Description of Issue Recommendation(s)  Drug Interaction(s) (clinically significant)     Duplicate Therapy     Allergy     No Medication Administration End Date     Incorrect Dose     Additional Drug Therapy Needed     Significant med changes from prior encounter (inform family/care partners about these prior to discharge).    Other       Clinically significant medication issues were identified that warrant physician communication and completion of prescribed/recommended actions by midnight of the next day:  No  Pharmacist comments: None  Time spent performing this drug regimen review (minutes):  20 minutes   Elwin Sleight 09/08/2021 8:12 AM

## 2021-09-08 NOTE — Progress Notes (Signed)
Inpatient Rehabilitation Center Individual Statement of Services  Patient Name:  Devin Jones  Date:  09/08/2021  Welcome to the Inpatient Rehabilitation Center.  Our goal is to provide you with an individualized program based on your diagnosis and situation, designed to meet your specific needs.  With this comprehensive rehabilitation program, you will be expected to participate in at least 3 hours of rehabilitation therapies Monday-Friday, with modified therapy programming on the weekends.  Your rehabilitation program will include the following services:  Physical Therapy (PT), Occupational Therapy (OT), Speech Therapy (ST), 24 hour per day rehabilitation nursing, Therapeutic Recreaction (TR), Neuropsychology, Care Coordinator, Rehabilitation Medicine, Nutrition Services, Pharmacy Services, and Other  Weekly team conferences will be held on Tuesdays to discuss your progress.  Your Inpatient Rehabilitation Care Coordinator will talk with you frequently to get your input and to update you on team discussions.  Team conferences with you and your family in attendance may also be held.  Expected length of stay: 5-10 Days  Overall anticipated outcome:  Supervision to Mod I  Depending on your progress and recovery, your program may change. Your Inpatient Rehabilitation Care Coordinator will coordinate services and will keep you informed of any changes. Your Inpatient Rehabilitation Care Coordinator's name and contact numbers are listed  below.  The following services may also be recommended but are not provided by the Inpatient Rehabilitation Center:   Home Health Rehabiltiation Services Outpatient Rehabilitation Services    Arrangements will be made to provide these services after discharge if needed.  Arrangements include referral to agencies that provide these services.  Your insurance has been verified to be:  Medicaid Pending  Your primary doctor is:  NO PCP  Pertinent information  will be shared with your doctor and your insurance company.  Inpatient Rehabilitation Care Coordinator:  Susie Cassette [phone] or ([phone]  Information discussed with and copy given to patient by: Andria Rhein, 09/08/2021, 11:24 AM
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
## 2021-09-08 NOTE — Plan of Care (Signed)
°  Problem: RH Balance Goal: LTG Patient will maintain dynamic sitting balance (PT) Description: LTG:  Patient will maintain dynamic sitting balance with assistance during mobility activities (PT) Flowsheets (Taken 09/08/2021 1705) LTG: Pt will maintain dynamic sitting balance during mobility activities with:: Independent with assistive device    Problem: RH Bed Mobility Goal: LTG Patient will perform bed mobility with assist (PT) Description: LTG: Patient will perform bed mobility with assistance, with/without cues (PT). Flowsheets (Taken 09/08/2021 1705) LTG: Pt will perform bed mobility with assistance level of: Supervision/Verbal cueing   Problem: RH Bed to Chair Transfers Goal: LTG Patient will perform bed/chair transfers w/assist (PT) Description: LTG: Patient will perform bed to chair transfers with assistance (PT). Flowsheets (Taken 09/08/2021 1705) LTG: Pt will perform Bed to Chair Transfers with assistance level: Minimal Assistance - Patient > 75%   Problem: RH Car Transfers Goal: LTG Patient will perform car transfers with assist (PT) Description: LTG: Patient will perform car transfers with assistance (PT). Flowsheets (Taken 09/08/2021 1705) LTG: Pt will perform car transfers with assist:: Moderate Assistance - Patient 50 - 74%   Problem: RH Furniture Transfers Goal: LTG Patient will perform furniture transfers w/assist (OT/PT) Description: LTG: Patient will perform furniture transfers  with assistance (OT/PT). Flowsheets (Taken 09/08/2021 1705) LTG: Pt will perform furniture transfers with assist:: Minimal Assistance - Patient > 75%   Problem: RH Wheelchair Mobility Goal: LTG Patient will propel w/c in controlled environment (PT) Description: LTG: Patient will propel wheelchair in controlled environment, # of feet with assist (PT) Flowsheets (Taken 09/08/2021 1705) LTG: Pt will propel w/c in controlled environ  assist needed:: Supervision/Verbal cueing LTG: Propel w/c  distance in controlled environment: 126ft with LRAD Goal: LTG Patient will propel w/c in home environment (PT) Description: LTG: Patient will propel wheelchair in home environment, # of feet with assistance (PT). Flowsheets (Taken 09/08/2021 1705) LTG: Pt will propel w/c in home environ  assist needed:: Supervision/Verbal cueing Distance: wheelchair distance in controlled environment: 50   Problem: RH Stairs Goal: LTG Patient will ambulate up and down stairs w/assist (PT) Description: LTG: Patient will ambulate up and down # of stairs with assistance (PT) Flowsheets (Taken 09/08/2021 1705) LTG: Pt will ambulate up/down stairs assist needed:: Dependent - Patient equals 0% LTG: Pt will  ambulate up and down number of stairs: with assist from family to access home

## 2021-09-08 NOTE — Progress Notes (Signed)
Inpatient Rehabilitation Care Coordinator Assessment and Plan Patient Details  Name: Bakari Nikolai MRN: [id number] Date of Birth: [birth year]  Today's Date: 09/08/2021  Hospital Problems: Principal Problem:   TBI (traumatic brain injury) Active Problems:   Pelvic fracture [facility])   Multiple trauma  Past Medical History:  Past Medical History:  Diagnosis Date   Alcohol abuse    Past Surgical History:  Past Surgical History:  Procedure Laterality Date   COSMETIC SURGERY     MEDIAL COLLATERAL LIGAMENT REPAIR, KNEE Right 08/31/2021   Procedure: RIGHT KNEE MEDIAL COLLATERAL LIGAMENT REPAIR/RECONSTRUCTION (ALL OPEN);  Surgeon: Cammy Copa, MD;  Location: [facility] OR;  Service: Orthopedics;  Laterality: Right;   ORIF RADIAL FRACTURE Right 08/28/2021   Procedure: OPEN REDUCTION INTERNAL FIXATION (ORIF) RADIAL FRACTURE;  Surgeon: Myrene Galas, MD;  Location: [facility] OR;  Service: Orthopedics;  Laterality: Right;   SACRO-ILIAC PINNING Right 08/28/2021   Procedure: SACRO-ILIAC SCREW FIXATION WITH  ANTERIOR PELVIC REPAIR;  Surgeon: Myrene Galas, MD;  Location: [facility] OR;  Service: Orthopedics;  Laterality: Right;   Social History:  reports that he has never smoked. He has never used smokeless tobacco. He reports current alcohol use of about 19.0 standard drinks per week. He reports that he does not use drugs.  Family / Support Systems Marital Status: Single Other Supports: Brother Environmental manager) Anticipated Caregiver: Lives with Amada [place] Ability/Limitations of Caregiver: available most of the time, will need to leave for work a few hours some days Caregiver Availability: Intermittent Family Dynamics: suport from brother and family  Social History Preferred language: English Religion:  Health Literacy - How often do you need to have someone help you when you read instructions, pamphlets, or other written material from your doctor or pharmacy?: Sometimes Writes: Yes Employment Status: Employed    Abuse/Neglect Abuse/Neglect Assessment Can Be Completed: Yes Physical Abuse: Denies Verbal Abuse: Denies Sexual Abuse: Denies Exploitation of patient/patient's resources: Denies Self-Neglect: Denies  Patient response to: Social Isolation - How often do you feel lonely or isolated from those around you?: Never  Emotional Status Recent Psychosocial Issues: alcohol abuse Psychiatric History: n/a Substance Abuse History: alcohol  Patient / Family Perceptions, Expectations & Goals Pt/Family understanding of illness & functional limitations: yes Premorbid pt/family roles/activities: previously actice, indpendent and working Anticipated changes in roles/activities/participation: anticipating MOD I brother able to assist some Pt/family expectations/goals: MOD I to [employer]: None Premorbid Home Care/DME Agencies: Other (Comment) (shower seat) Transportation available at discharge: brother Is the patient able to respond to transportation needs?: Yes In the past 12 months, has lack of transportation kept you from medical appointments or from getting medications?: No In the past 12 months, has lack of transportation kept you from meetings, work, or from getting things needed for daily living?: No Resource referrals recommended: Neuropsychology  Discharge Planning Living Arrangements: Other relatives Support Systems: Other relatives Type of Residence: Private residence Insurance Resources: Self-pay (Medicaid pending) Financial Resources: Employment, Garment/textile technologist Screen Referred: Yes Living Expenses: Lives with family Money Management: Patient Does the patient have any problems obtaining your medications?: Yes (Describe) (cost) Home Management: independent Patient/Family Preliminary Plans: brother able to assist some Care Coordinator Barriers to Discharge: Insurance for SNF coverage, Decreased caregiver support, Lack of/limited family  support Care Coordinator Anticipated Follow Up Needs: HH/OP Expected length of stay: 8-10 Days  Clinical Impression Covering for primary SW, [place]. Sw spoke to pt brother, patient with therapy. Introduced self, explained role and addressed questions. Pt plans to  d/c back home with brother with intermittent assistance. SW will continue to follow up.   Andria Rhein 09/08/2021, 11:23 AM

## 2021-09-09 NOTE — Progress Notes (Signed)
Physical Therapy TBI Note  Patient Details  Name: Devin Jones MRN: [id number] Date of Birth: 01-03-1966  Today's Date: 09/09/2021 PT Individual Time: 0915-1025 PT Individual Time Calculation (min): 70 min   Short Term Goals: Week 1:  PT Short Term Goal 1 (Week 1): Pt will tolerate sitting in WC >2 hourrs between therapies PT Short Term Goal 2 (Week 1): Pt will transfer to [facility] with mod assist and LRAD PT Short Term Goal 3 (Week 1): Pt will  initiate WC mobility with one arm drive WC.  Skilled Therapeutic Interventions/Progress Updates:     Patient in bed upon PT arrival. Patient alert and agreeable to PT session. Patient denied pain during session.  Patient unable to recall all of his injuries, precautions, and mobility restrictions. PT spent increased time reviewing orthopedic notes and imaging to review patient's multiple fractures and soft tissue injuries to educate patient and improve patient understanding and recall of injuries and precautions.   Reviewed that B lower extremities are NWB due to his R multi-ligament knee injury and pelvic fractures L>R, discussed location of fractures and reason for NWB at this time to promote optimal healing. Per ortho notes, patient is cleared for gentle PROM for R knee with therapy as of 12/22, full ROM of L knee with hinge brace donned due to lateral joint laxity, cleared for unrestricted B hip ROM. R upper extremity cleared for WB thru elbow only, no active elbow extension against resistance and R shoulder ROM as tolerated.   Patient unaware that he was NWB on L lower extremity, thought he could push through it because that knee was "not as bad," reinforced that his pelvic fractures on the L are the reason for NWB and educated on forces affecting joints up the chain with closed chain activities. Updated patient's precautions to include all weight bearing and ROM allowances/restrictions.   Patient then performed the following bed level  exercises: -B ankle pumps x10, encouraged frequent performance of this exercise throughout the day -B quad sets x10 with 5 sec hold, encouraged frequent performance of this exercise throughout the day -B heel slides x10 gentle PROM on R, AAROM on L with Bledsoe brace (brace locked in flexion at 60 deg prior to mobility will check with medical team on flexion ROM restrictions) -B SLR x5 AAROM focused on starting with quad set -B hip abduction/adduction x8 PROM on R AROM on R -L hip flexion AAROM x5  Patient denied any increased pain during and after ROM exercises.  Patient in bed at end of session with breaks locked, bed alarm set, and all needs within reach.   Therapy Documentation Precautions:  Precautions Precautions: Fall Precaution Comments: NWB BLE 6-8 weeks, WB through R elbow only Required Braces or Orthoses: Knee Immobilizer - Right, Other Brace Other Brace: L bledsoe brace unlocked, R KI locked in extension Restrictions Weight Bearing Restrictions: Yes RUE Weight Bearing: Weight bear through elbow only RLE Weight Bearing: Non weight bearing LLE Weight Bearing: Non weight bearing Other Position/Activity Restrictions: NO weight bearing on L LE due ot pelvic fractures per ortho, cleared for R knee ROM on 12/22 focus on quad sets, L knee full ROM in bledsoe brace, cleared for full B hip ROM, cleared for R shoulder ROM as tolerated, no R elbow extension with resistance Agitated Behavior Scale: TBI Observation Details Observation Environment: Patient's room Start of observation period - Date: 09/09/21 Start of observation period - Time: 0915 End of observation period - Date: 09/09/21 End of observation  period - Time: 1025 Agitated Behavior Scale (DO NOT LEAVE BLANKS) Short attention span, easy distractibility, inability to concentrate: Absent Impulsive, impatient, low tolerance for pain or frustration: Absent Uncooperative, resistant to care, demanding: Absent Violent and/or  threatening violence toward people or property: Absent Explosive and/or unpredictable anger: Absent Rocking, rubbing, moaning, or other self-stimulating behavior: Absent Pulling at tubes, restraints, etc.: Absent Wandering from treatment areas: Absent Restlessness, pacing, excessive movement: Absent Repetitive behaviors, motor, and/or verbal: Absent Rapid, loud, or excessive talking: Absent Sudden changes of mood: Absent Easily initiated or excessive crying and/or laughter: Absent Self-abusiveness, physical and/or verbal: Absent Agitated behavior scale total score: 14    Therapy/Group: Individual Therapy  Loletta Harper L Doni Widmer PT, DPT  09/09/2021, 4:24 PM

## 2021-09-09 NOTE — Progress Notes (Signed)
Occupational Therapy TBI Note  Patient Details  Name: Devin Jones MRN: [id number] Date of Birth: 03/19/1966  Today's Date: 09/09/2021 OT Individual Time: 2706-2376 OT Individual Time Calculation (min): 40 min    Short Term Goals: Week 1:  OT Short Term Goal 1 (Week 1): Patient will complete BSC transfer with Max A of 2 OT Short Term Goal 2 (Week 1): Pt will tolerate being OOB for 30 minutes OT Short Term Goal 3 (Week 1): Patient will demonstrate use of LH reacher to help thread pants  Skilled Therapeutic Interventions/Progress Updates:  Skilled OT intervention completed with focus on transfer education, BUE strengthening and endurance. Pt received supine in bed, agreeable to session. NT in room assessing vitals. Discussed in detail pt's difficulty with transfers, with pt able to teach back method of using beasy board. Pt complaint of fatigue, with pt agreeable bed level exercises. Educated pt on LUE strengthening endurance exercises, including the following to promote integrity/strength of LUE needed for functional transfers:  5 pound dumbbell -Bicep curls 2x20 3 pound dumbbell- Overhead presses 2x20, front shoulder raises 2x20  Cues needed for form and technique throughout, with pt with extensive questions about the anatomy associated with each exercise. Education provided on structures involved with cues needed to remind pt to breathe, with breathing techniques utilized during concentric and eccentric phases. Pt left in long sitting in bed, with bed alarm on and all needs in reach at end of session.   Therapy Documentation Precautions:  Precautions Precautions: Fall Precaution Comments: NWB BLE 6-8 weeks, WB through R elbow only Other Brace: L bledsoe brace unlocked, R KI locked in extension Restrictions Weight Bearing Restrictions: Yes RUE Weight Bearing: Weight bear through elbow only RLE Weight Bearing: Non weight bearing LLE Weight Bearing: Non weight bearing Other  Position/Activity Restrictions: Dr. Diamantina Providence note states able to weight bear through LLE with hinge brace --- per Montez Morita PA secure chat on 12/20- "I do not want him weightbearing on his left leg for transfers despite what the other ortho team said.  his pelvic ring injury on the left was more severe".  Pain: No c/o pain Agitated Behavior Scale: TBI Observation Details Observation Environment: patients room Start of observation period - Date: 09/09/21 Start of observation period - Time: 1300 End of observation period - Date: 09/09/21 End of observation period - Time: 1345 Agitated Behavior Scale (DO NOT LEAVE BLANKS) Short attention span, easy distractibility, inability to concentrate: Absent Impulsive, impatient, low tolerance for pain or frustration: Absent Uncooperative, resistant to care, demanding: Absent Violent and/or threatening violence toward people or property: Absent Explosive and/or unpredictable anger: Absent Rocking, rubbing, moaning, or other self-stimulating behavior: Absent Pulling at tubes, restraints, etc.: Absent Wandering from treatment areas: Absent Restlessness, pacing, excessive movement: Absent Repetitive behaviors, motor, and/or verbal: Absent Rapid, loud, or excessive talking: Absent Sudden changes of mood: Absent Easily initiated or excessive crying and/or laughter: Absent Self-abusiveness, physical and/or verbal: Absent Agitated behavior scale total score: 14     Therapy/Group: Individual Therapy  Devin Jones 09/09/2021, 7:50 AM

## 2021-09-09 NOTE — Progress Notes (Signed)
PROGRESS NOTE   Subjective/Complaints: Up in bed. OT starting session. No new complaints today.   ROS: Patient denies fever, rash, sore throat, blurred vision, nausea, vomiting, diarrhea, cough, shortness of breath or chest pain, joint or back pain, headache, or mood change.     Objective:   No results found. Recent Labs    09/08/21 0524  WBC 9.4  HGB 11.4*  HCT 34.2*  PLT 450*   Recent Labs    09/08/21 0524  NA 136  K 4.3  CL 103  CO2 24  GLUCOSE 106*  BUN 19  CREATININE 0.77  CALCIUM 9.0    Intake/Output Summary (Last 24 hours) at 09/09/2021 0908 Last data filed at 09/09/2021 0748 Gross per 24 hour  Intake 837 ml  Output 1400 ml  Net -563 ml        Physical Exam: Vital Signs Blood pressure 115/78, pulse 80, temperature 97.8 F (36.6 C), temperature source Oral, resp. rate 17, height 5\' 11"  (1.803 m), weight 82.5 kg, SpO2 97 %.  Constitutional: No distress . Vital signs reviewed. HEENT: NCAT, EOMI, oral membranes moist Neck: supple Cardiovascular: RRR without murmur. No JVD    Respiratory/Chest: CTA Bilaterally without wheezes or rales. Normal effort    GI/Abdomen: BS +, non-tender, non-distended Ext: no clubbing, cyanosis, or edema Psych: pleasant and cooperative  Skin: scab over right eye/face, post-op dressing RLE without draiange. Left hip dressing in place. Ecchymoses LLE. KI and bledsoe fits appropriately Neuro:  Alert and oriented x 3. Normal insight and awareness. Intact Memory. Normal language and speech. Cranial nerve exam unremarkable. UE 5/5. LE 1/5 prox to 4/5 ADF/PF. No sensory deficits. No abnl tone Musculoskeletal: LE's limited by bracing, both LE's tender with ROM   Assessment/Plan: 1. Functional deficits which require 3+ hours per day of interdisciplinary therapy in a comprehensive inpatient rehab setting. Physiatrist is providing close team supervision and 24 hour management of  active medical problems listed below. Physiatrist and rehab team continue to assess barriers to discharge/monitor patient progress toward functional and medical goals  Care Tool:  Bathing    Body parts bathed by patient: Right arm, Chest, Abdomen, Front perineal area, Face   Body parts bathed by helper: Left arm, Buttocks Body parts n/a: Right upper leg, Left upper leg, Right lower leg, Left lower leg   Bathing assist Assist Level: Maximal Assistance - Patient 24 - 49%     Upper Body Dressing/Undressing Upper body dressing   What is the patient wearing?: Pull over shirt    Upper body assist Assist Level: Maximal Assistance - Patient 25 - 49%    Lower Body Dressing/Undressing Lower body dressing      What is the patient wearing?: Pants     Lower body assist Assist for lower body dressing: Total Assistance - Patient < 25%     Toileting Toileting Toileting Activity did not occur and hygiene only): N/A (no void or bm) (idependent with urinal)  Toileting assist       Transfers Chair/bed transfer  Transfers assist  Chair/bed transfer activity did not occur: Safety/medical concerns  Chair/bed transfer assist level: Total Assistance - Patient < 25%  Locomotion Ambulation   Ambulation assist   Ambulation activity did not occur: Safety/medical concerns          Walk 10 feet activity   Assist  Walk 10 feet activity did not occur: Safety/medical concerns        Walk 50 feet activity   Assist Walk 50 feet with 2 turns activity did not occur: Safety/medical concerns         Walk 150 feet activity   Assist Walk 150 feet activity did not occur: Safety/medical concerns         Walk 10 feet on uneven surface  activity   Assist Walk 10 feet on uneven surfaces activity did not occur: Safety/medical concerns         Wheelchair     Assist Is the patient using a wheelchair?: Yes      Wheelchair assist level:  Total Assistance - Patient < 25% Max wheelchair distance: 150    Wheelchair 50 feet with 2 turns activity    Assist        Assist Level: Total Assistance - Patient < 25%   Wheelchair 150 feet activity     Assist      Assist Level: Total Assistance - Patient < 25%   Blood pressure 115/78, pulse 80, temperature 97.8 F (36.6 C), temperature source Oral, resp. rate 17, height 5\' 11"  (1.803 m), weight 82.5 kg, SpO2 97 %.  Medical Problem List and Plan: 1. 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  Antithrombotics: -DVT/anticoagulation:  Pharmaceutical: Lovenox--6 weeks total of AC recommended.              -antiplatelet therapy: N/A 3. Pain Management: Continue Tylenol qid, Gabapentin TID with robaxin prn             --oxycodone prn for severe pain              -- ice/heat prn for local  measures 4. Mood: LCSW to follow for evaluation and support.              -antipsychotic agents: N/A 5. Neuropsych: This patient is capable of making decisions on his own behalf. 6. Skin/Wound Care: Routine pressure relief measures.  7. Fluids/Electrolytes/Nutrition: Monitor I/O.               --encourage fluid intake, BUN stable to sl improved     8. Pelvic ring fracture s/p SI nail: To be NWB BLE with bed to chair slide/lift transfers for 8 weeks. 9. R-Galeazzi fracture/dislocation s/p ORIF: NWB RUE--ok to WB thru elbow             --keep cast C/D 10 Right knee dislocation s/p MCL/posterior obique ligament repair: KI in place.    - PROM with therapy 11. Left knee PCL/ACL derangement: Hinged brace for support and reconstruction in 4-6 weeks.   12. Metabolic bone disease: On Vitamin D for supplement.  13. ABLA: hgb up to 11.4 today, some concentration effect. 14. Abnormal LFTS: Noted to  be trending upwards.    -LFT's back down 12/23. Likely reactive, f/u prior to dc 15. Thrombocytopenia: resolved  16. Hyperglycemia: Stress induced v/s pre-diabetes.   hgb  A1C 5.5  17. Hypoalbuminemia: Protein supplements added.     LOS: 2 days A FACE TO FACE EVALUATION WAS PERFORMED  Ranelle Oyster 09/09/2021, 9:08 AM

## 2021-09-09 NOTE — Progress Notes (Signed)
Occupational Therapy TBI Note  Patient Details  Name: Devin Jones MRN: [id number] Date of Birth: [birth year]  Today's Date: 09/09/2021 OT Individual Time: 0705-0800 OT Individual Time Calculation (min): 55 min    Short Term Goals: Week 1:  OT Short Term Goal 1 (Week 1): Patient will complete BSC transfer with Max A of 2 OT Short Term Goal 2 (Week 1): Pt will tolerate being OOB for 30 minutes OT Short Term Goal 3 (Week 1): Patient will demonstrate use of LH reacher to help thread pants  Skilled Therapeutic Interventions/Progress Updates:    Pt greeted semi-reclined in bed and agreeable to OT treatment session focused on self-care retraining. OT looked for wide drop arm BSC, but could not find the right one. Pt declined need to have a BM at this time. Worked on bed level LB dressing with education provided for rolling to wash buttocks. Pt was able to get all the way over on R side with min A, then reach behind to help pull down pants and wash buttocks. OT issued long handled reacher and educated on use for threading underwear and pants. Focus on lifting B LE one at a time to help place in leg in pants. Worked on coming into long sitting in bed with max/total A and pushing through R elbow. Rolling to pull pants up with min A to roll and max A to pull up pants. UB bathing/dressing in supported sitting in bed with min A. OT unhooked KI and bledsoe brace to let skin breathe. Pt left semi-reclined in bed with call bell in reach, alarm on, and needs met.   Therapy Documentation Precautions:  Precautions Precautions: Fall Precaution Comments: NWB BLE 6-8 weeks, WB through R elbow only Other Brace: L bledsoe brace unlocked, R KI locked in extension Restrictions Weight Bearing Restrictions: Yes RUE Weight Bearing: Weight bear through elbow only RLE Weight Bearing: Non weight bearing LLE Weight Bearing: Non weight bearing Other Position/Activity Restrictions: Dr. Randel Pigg note states able to  weight bear through LLE with hinge brace --- per Ainsley Spinner PA secure chat on 12/20- "I do not want him weightbearing on his left leg for transfers despite what the other ortho team said.  his pelvic ring injury on the left was more severe". Pain:  Denies pain Agitated Behavior Scale: TBI Observation Details Observation Environment: CIR Start of observation period - Date: 09/09/21 Start of observation period - Time: 0700 End of observation period - Date: 09/09/21 End of observation period - Time: 0800 Agitated Behavior Scale (DO NOT LEAVE BLANKS) Short attention span, easy distractibility, inability to concentrate: Absent Impulsive, impatient, low tolerance for pain or frustration: Absent Uncooperative, resistant to care, demanding: Absent Violent and/or threatening violence toward people or property: Absent Explosive and/or unpredictable anger: Absent Rocking, rubbing, moaning, or other self-stimulating behavior: Absent Pulling at tubes, restraints, etc.: Absent Wandering from treatment areas: Absent Restlessness, pacing, excessive movement: Absent Repetitive behaviors, motor, and/or verbal: Absent Rapid, loud, or excessive talking: Absent Sudden changes of mood: Absent Easily initiated or excessive crying and/or laughter: Absent Self-abusiveness, physical and/or verbal: Absent Agitated behavior scale total score: 14   Therapy/Group: Individual Therapy  Valma Cava 09/09/2021, 8:15 AM

## 2021-09-11 DIAGNOSIS — S83104D Unspecified dislocation of right knee, subsequent encounter: Secondary | ICD-10-CM

## 2021-09-11 LAB — BASIC METABOLIC PANEL
Anion gap: 10 (ref 5–15)
BUN: 19 mg/dL (ref 6–20)
CO2: 27 mmol/L (ref 22–32)
Calcium: 9 mg/dL (ref 8.9–10.3)
Chloride: 102 mmol/L (ref 98–111)
Creatinine, Ser: 0.87 mg/dL (ref 0.61–1.24)
GFR, Estimated: >60 mL/min (ref >60)
Glucose, Bld: 104 mg/dL — ABNORMAL HIGH (ref 70–99)
Potassium: 3.9 mmol/L (ref 3.5–5.1)
Sodium: 139 mmol/L (ref 135–145)

## 2021-09-11 NOTE — Progress Notes (Signed)
PROGRESS NOTE   C/o low back pain Asks if hair can be washed Lower extremities are currently being washed with OT He has no other complaints  ROS: Patient denies fever, rash, sore throat, blurred vision, nausea, vomiting, diarrhea, cough, shortness of breath or chest pain, headache, or mood change.  +low back pain   Objective:   No results found. No results for input(s): WBC, HGB, HCT, PLT in the last 72 hours.  Recent Labs    09/11/21 0703  NA 139  K 3.9  CL 102  CO2 27  GLUCOSE 104*  BUN 19  CREATININE 0.87  CALCIUM 9.0    Intake/Output Summary (Last 24 hours) at 09/11/2021 1348 Last data filed at 09/11/2021 0747 Gross per 24 hour  Intake 476 ml  Output 1200 ml  Net -724 ml        Physical Exam: Vital Signs Blood pressure 107/75, pulse 86, temperature 98.2 F (36.8 C), temperature source Oral, resp. rate 16, height 5\' 11"  (1.803 m), weight 82.5 kg, SpO2 94 %. Gen: no distress, normal appearing HEENT: oral mucosa pink and moist, NCAT Cardio: Reg rate Chest: normal effort, normal rate of breathing Abd: soft, non-distended Ext: no edema Psych: pleasant, normal affect Skin: scab over right eye/face, post-op dressing RLE without draiange. Left hip dressing in place. Ecchymoses LLE. KI and bledsoe fits appropriately Neuro:  Alert and oriented x 3. Normal insight and awareness. Intact Memory. Normal language and speech. Cranial nerve exam unremarkable. UE 5/5. LE 1/5 prox to 4/5 ADF/PF. No sensory deficits. No abnl tone Musculoskeletal: LE's limited by bracing, both LE's tender with ROM   Assessment/Plan: 1. Functional deficits which require 3+ hours per day of interdisciplinary therapy in a comprehensive inpatient rehab setting. Physiatrist is providing close team supervision and 24 hour management of active medical problems listed below. Physiatrist and rehab team continue to assess barriers to  discharge/monitor patient progress toward functional and medical goals  Care Tool:  Bathing    Body parts bathed by patient: Right arm, Chest, Abdomen, Front perineal area, Face   Body parts bathed by helper: Left arm, Buttocks Body parts n/a: Right upper leg, Left upper leg, Right lower leg, Left lower leg   Bathing assist Assist Level: Maximal Assistance - Patient 24 - 49%     Upper Body Dressing/Undressing Upper body dressing   What is the patient wearing?: Pull over shirt    Upper body assist Assist Level: Maximal Assistance - Patient 25 - 49%    Lower Body Dressing/Undressing Lower body dressing      What is the patient wearing?: Pants     Lower body assist Assist for lower body dressing: Total Assistance - Patient < 25%     Toileting Toileting Toileting Activity did not occur and hygiene only): N/A (no void or bm) (idependent with urinal)  Toileting assist Assist for toileting: Maximal Assistance - Patient 25 - 49% Assistive Device Comment: BSC via beasy board   Transfers Chair/bed transfer  Transfers assist  Chair/bed transfer activity did not occur: Safety/medical concerns  Chair/bed transfer assist level: Total Assistance - Patient < 25%     Locomotion Ambulation  Ambulation assist   Ambulation activity did not occur: Safety/medical concerns          Walk 10 feet activity   Assist  Walk 10 feet activity did not occur: Safety/medical concerns        Walk 50 feet activity   Assist Walk 50 feet with 2 turns activity did not occur: Safety/medical concerns         Walk 150 feet activity   Assist Walk 150 feet activity did not occur: Safety/medical concerns         Walk 10 feet on uneven surface  activity   Assist Walk 10 feet on uneven surfaces activity did not occur: Safety/medical concerns         Wheelchair     Assist Is the patient using a wheelchair?: Yes      Wheelchair assist level:  Total Assistance - Patient < 25% Max wheelchair distance: 150    Wheelchair 50 feet with 2 turns activity    Assist        Assist Level: Total Assistance - Patient < 25%   Wheelchair 150 feet activity     Assist      Assist Level: Total Assistance - Patient < 25%   Blood pressure 107/75, pulse 86, temperature 98.2 F (36.8 C), temperature source Oral, resp. rate 16, height 5\' 11"  (1.803 m), weight 82.5 kg, SpO2 94 %.  Medical Problem List and Plan: 1. Functional deficits secondary to mult trauma dn TBI and B/L pelvic fracture             -patient may not shower             -ELOS/Goals: 8-10 days- s/u to mod I- for transfers  Continue CIR 2.  Impaired mobility: continue  Pharmaceutical: Lovenox--6 weeks total of AC recommended.              -antiplatelet therapy: N/A 3. Low back pain: Continue Tylenol qid, Gabapentin TID with robaxin prn. Add kpad             --oxycodone prn for severe pain              -- ice/heat prn for local  measures 4. Mood: LCSW to follow for evaluation and support.              -antipsychotic agents: N/A 5. Neuropsych: This patient is capable of making decisions on his own behalf. 6. Skin/Wound Care: Routine pressure relief measures.  7. Fluids/Electrolytes/Nutrition: Monitor I/O.               --encourage fluid intake, BUN stable to sl improved     8. Pelvic ring fracture s/Devin SI nail: To be NWB BLE with bed to chair slide/lift transfers for 8 weeks. 9. R-Galeazzi fracture/dislocation s/Devin ORIF: NWB RUE--ok to WB thru elbow             --keep cast C/D 10 Right knee dislocation s/Devin MCL/posterior obique ligament repair: KI in place.    - PROM with therapy 11. Left knee PCL/ACL derangement: Hinged brace for support and reconstruction in 4-6 weeks.   12. Metabolic bone disease: continue Vitamin D for supplement.  13. ABLA: hgb up to 11.4 today, some concentration effect. 14. Abnormal LFTS: Noted to be trending upwards.    -LFT's back down  12/23. Likely reactive, f/u prior to dc 15. Thrombocytopenia: resolved  16. Hyperglycemia: Stress induced v/s pre-diabetes.   hgb A1C 5.5  17. Hypoalbuminemia: Protein supplements added.  LOS: 4 days A FACE TO FACE EVALUATION WAS PERFORMED  Devin Jones Devin Jones 09/11/2021, 1:48 PM

## 2021-09-11 NOTE — Progress Notes (Signed)
Orthopedic Tech Progress Note Patient Details:  Devin Jones May 30, 1966 [id number]  CPM Right Knee CPM Right Knee: On Right Knee Flexion (Degrees): 0 Right Knee Extension (Degrees): 30  Post Interventions Patient Tolerated: Well Instructions Provided: Care of device, Adjustment of device  Trinna Post 09/11/2021, 8:35 PM

## 2021-09-11 NOTE — Progress Notes (Signed)
Physical Therapy TBI Note  Patient Details  Name: Devin Jones MRN: [id number] Date of Birth: 1966/03/01  Today's Date: 09/11/2021 PT Individual Time: 7001-7494 PT Individual Time Calculation (min): 27 min   Short Term Goals: Week 1:  PT Short Term Goal 1 (Week 1): Pt will tolerate sitting in WC >2 hourrs between therapies PT Short Term Goal 2 (Week 1): Pt will transfer to [facility] with mod assist and LRAD PT Short Term Goal 3 (Week 1): Pt will  initiate WC mobility with one arm drive WC.  Skilled Therapeutic Interventions/Progress Updates:     Patient in bed upon PT arrival. Patient alert and agreeable to PT session. Patient reported muscle soreness and fatigue at beginning of session. Reported that he felt accomplished today due to performing multiple transfers to w/c and to the [facility], tolerating "rigorous" lower extremity exercises in PT, sitting up in the w/c >1 hour between sessions.   Focused session on bed mobility, R upper extremity exercise, and demonstration of one-arm drive w/c. Retrieved w/c while patient completed therapeutic exercises following demonstration of good technique. Demonstrated steering and ability to be independent using his L upper extremity for w/c mobility in one-arm drive w/c. Suggested looking at options with lead PT on renting versus ordering a specialty w/c due to anticipated progression to standing mobility over time. Patient appreciative and willing to trial this w/c tomorrow during therapy.   Therapeutic Activity: Bed Mobility: Patient performed supine to/from sit with min A using a gait belt as a leg lifter for his R lower extremity in a flat bed without use of bed rails. Provided verbal cues for sequencing and maintaining elbow weight bearing only on R upper extremity, and assist for L lower extremity adduction, trunk control for sitting up.  Therapeutic Exercise: Patient performed the following exercises with verbal and tactile cues for proper  technique. -R shoulder IR/ER in scapular plane in supine x10 -R shoulder abduction to 90 deg/adduction in supine with elbow flexed 2x10 -R elbow extension with gravity in supine for biceps stretch x10 sec followed by full elbow flexion 6, noted patient lacking 15-20 deg of elbow extension, reports tightness at posterior joint capsule with "discomfort."  Patient in bed at end of session with breaks locked, bed alarm set, and all needs within reach.   Therapy Documentation Precautions:  Precautions Precautions: Fall Precaution Comments: NWB BLE 6-8 weeks, WB through R elbow only Required Braces or Orthoses: Knee Immobilizer - Right, Other Brace Other Brace: L bledsoe brace unlocked, R KI locked in extension Restrictions Weight Bearing Restrictions: Yes RUE Weight Bearing: Weight bearing as tolerated RLE Weight Bearing: Non weight bearing LLE Weight Bearing: Non weight bearing Other Position/Activity Restrictions: NO weight bearing on L LE due ot pelvic fractures per ortho, cleared for R knee ROM on 12/22 focus on quad sets, L knee full ROM in bledsoe brace, cleared for full B hip ROM, cleared for R shoulder ROM as tolerated, no R elbow extension with resistance  Agitated Behavior Scale: TBI Observation Details Observation Environment: Patient's room Start of observation period - Date: 09/11/21 Start of observation period - Time: 1405 End of observation period - Date: 09/11/21 End of observation period - Time: 1432 Agitated Behavior Scale (DO NOT LEAVE BLANKS) Short attention span, easy distractibility, inability to concentrate: Absent Impulsive, impatient, low tolerance for pain or frustration: Absent Uncooperative, resistant to care, demanding: Absent Violent and/or threatening violence toward people or property: Absent Explosive and/or unpredictable anger: Absent Rocking, rubbing, moaning, or other  self-stimulating behavior: Absent Pulling at tubes, restraints, etc.:  Absent Wandering from treatment areas: Absent Restlessness, pacing, excessive movement: Absent Repetitive behaviors, motor, and/or verbal: Absent Rapid, loud, or excessive talking: Absent Sudden changes of mood: Absent Easily initiated or excessive crying and/or laughter: Absent Self-abusiveness, physical and/or verbal: Absent Agitated behavior scale total score: 14    Therapy/Group: Individual Therapy  Elsie Baynes L Virgilia Quigg PT, DPT  09/11/2021, 6:48 PM

## 2021-09-11 NOTE — Progress Notes (Signed)
Okay for CPM machine right knee 0 to 30 1-hour 3 times a day.  Okay for physical therapy to begin bending the knee with no valgus stress.  Nonweightbearing right lower extremity.

## 2021-09-11 NOTE — Progress Notes (Signed)
Occupational Therapy Session Note  Patient Details  Name: Devin Jones MRN: [id number] Date of Birth: 1966-05-30  Today's Date: 09/11/2021 OT Individual Time: 2841-3244 OT Individual Time Calculation (min): 72 min   OT Individual Time: 1300-1345 OT Individual Time Calculation (min): 45 min   Short Term Goals: Week 1:  OT Short Term Goal 1 (Week 1): Patient will complete BSC transfer with Max A of 2 OT Short Term Goal 2 (Week 1): Pt will tolerate being OOB for 30 minutes OT Short Term Goal 3 (Week 1): Patient will demonstrate use of LH reacher to help thread pants  Skilled Therapeutic Interventions/Progress Updates:  Session 1: Patient met lying supine in bed in agreement with OT treatment session. 0/10 pain reported at rest. Patient notes pain present with HEP during L knee extension only. Patient in agreement with LB bathing/dressing at bed level and UB bathing/dressing at sink level. Total A to doff R knee immobilizer and LLE hinge 4brace in prep for task. Patient able to doff underwear/pants at bedlevel with cues for NWB status in BLE. HOB elevated and long handled sponge utilized to wash lower legs. External assist required to wash feet. Patient then able to adjust position of RLE with use of makeshift leg lifter and thread BLE through underwear/shorts with use of reacher. Total A to don R knee immobilizer and LLE hinge brace. With bed flat, patient able to roll R<>L with Min A-Mod A to wash buttocks and to hike underwear/shorts over hips. Assist to come to sitting position at EOB and to position beasy board. After transfer to wc with beasy board and cues for technique, patient completed UB bathing/dressing seated at sink level with Min A. Session concluded with patient seated in wc with call bell within reach, chair alarm activated and all needs met.   Session 2: Patient met semi-reclined in supine in agreement with OT treatment session. 0/10 pain reported at rest and with activity  but patient notes mild fatigue from therapies prior to this session. This Probation officer obtained a wide BSC for education on toilet transfers and toileting with adherence to precautions. Max A for transfer EOB <> BSC via beasy board. Patient able to void bladder and have BM with Max A for hygiene/clothing management. Unable to hike pants over hips seated on BSC so task completed in supine. Patient would benefit from core strengthening and stretching of hip flexors to improve safety/independence with ADL tasks. Session concluded with patient in supine with call bell within reach, bed alarm activated and all needs met.   Therapy Documentation Precautions:  Precautions Precautions: Fall Precaution Comments: NWB BLE 6-8 weeks, WB through R elbow only Required Braces or Orthoses: Knee Immobilizer - Right, Other Brace Other Brace: L bledsoe brace unlocked, R KI locked in extension Restrictions Weight Bearing Restrictions: Yes RUE Weight Bearing: Weight bearing as tolerated RLE Weight Bearing: Non weight bearing LLE Weight Bearing: Non weight bearing Other Position/Activity Restrictions: NO weight bearing on L LE due ot pelvic fractures per ortho, cleared for R knee ROM on 12/22 focus on quad sets, L knee full ROM in bledsoe brace, cleared for full B hip ROM, cleared for R shoulder ROM as tolerated, no R elbow extension with resistance General:    Therapy/Group: Individual Therapy  Ainsley Sanguinetti R Howerton-Davis 09/11/2021, 7:03 AM

## 2021-09-11 NOTE — Progress Notes (Signed)
Physical Therapy Session Note  Patient Details  Name: Devin Jones MRN: [id number] Date of Birth: 1966-05-18  Today's Date: 09/11/2021 PT Individual Time: 1120-1201 PT Individual Time Calculation (min): 41 min   Short Term Goals: Week 1:  PT Short Term Goal 1 (Week 1): Pt will tolerate sitting in WC >2 hourrs between therapies PT Short Term Goal 2 (Week 1): Pt will transfer to [facility] with mod assist and LRAD PT Short Term Goal 3 (Week 1): Pt will  initiate WC mobility with one arm drive WC.  Skilled Therapeutic Interventions/Progress Updates:     Pt received seated in [facility] and agrees to therapy. Reports mild pain in back. Number not provided. PT provides rest breaks and mobility to manage pain. WC transport to gym for time management. Pt performs transfer from [facility] to high-low mat with [employer] and modA/maxA. PT provides multimodal cues for sequencing, head-hips relationship, and initiation. Sit to supine with modA management of bilateral lower extremities. Pt requires supine rest break due to reported tightness in hip flexors. Pt then performs follow there-ex in supine, 1x10 each: Ankle pumps Quad sets Glute sets L Heel slides Hip ab/adduction (AAROM) SLRs (AAROM) Pt performs supine to sit with cues for sequencing and maxA. ModA/maxA for beezy board transfer from mat>WC>bed with multimodal cueing for trunk lean, sequencing and positioning. Sit to supine with modA. Left with alarm intact and all needs within reach.  Therapy Documentation Precautions:  Precautions Precautions: Fall Precaution Comments: NWB BLE 6-8 weeks, WB through R elbow only Required Braces or Orthoses: Knee Immobilizer - Right, Other Brace Other Brace: L bledsoe brace unlocked, R KI locked in extension Restrictions Weight Bearing Restrictions: Yes RUE Weight Bearing: Weight bearing as tolerated RLE Weight Bearing: Non weight bearing LLE Weight Bearing: Non weight bearing Other Position/Activity Restrictions:  NO weight bearing on L LE due ot pelvic fractures per ortho, cleared for R knee ROM on 12/22 focus on quad sets, L knee full ROM in bledsoe brace, cleared for full B hip ROM, cleared for R shoulder ROM as tolerated, no R elbow extension with resistance  Therapy/Group: Individual Therapy  Beau Fanny, PT, DPT 09/11/2021, 12:32 PM

## 2021-09-12 NOTE — Patient Care Conference (Signed)
Inpatient RehabilitationTeam Conference and Plan of Care Update Date: 09/12/2021   Time: 10:33 AM    Patient Name: Devin Jones      Medical Record Number: [id number]  Date of Birth: 08-13-1966 Sex: Male         Room/Bed: 4M03C/4M03C-01 Payor Info: Payor: MEDICAID PENDING / Plan: MEDICAID PENDING / Product Type: *No Product type* /    Admit Date/Time:  09/07/2021  3:59 PM  Primary Diagnosis:  TBI (traumatic brain injury)  Hospital Problems: Principal Problem:   TBI (traumatic brain injury) Active Problems:   Pelvic fracture [facility])   Multiple trauma    Expected Discharge Date: Expected Discharge Date: 09/27/21  Team Members Present: Physician leading conference: Dr. Faith Rogue Social Worker Present: Cecile Sheerer, LCSWA Nurse Present: Kennyth Arnold, RN PT Present: Malachi Pro, PT OT Present: Kearney Hard, OT PPS Coordinator present : Edson Snowball, PT     Current Status/Progress Goal Weekly Team Focus  Bowel/Bladder   pt cont of ba nd b lbm 09/09/21  remain cont of b and b  assessq shift and prn   Swallow/Nutrition/ Hydration             ADL's   Max A beasy board transfer, Mod A  LB ADLs with AE, total A toileting, Min A  UB ADLs  Supervision/min A  transfer traning, self-care retraining, activity tolerance, dc planning   Mobility   minA to modA bed mobility, modA transfers with [employer], supervision WC mobility  MinA  BLE strengthening and ROM, bed mobility, transfers, WC mobility   Communication             Safety/Cognition/ Behavioral Observations            Pain   pt had no c/o of pain through shift  remain pain free  assess pain in hips and r/l knee medicate as needed and scheled times   Skin   abrasion to right side of face, surgical inciion on r and L hi, surgical incion on rt knee.  no new break down and healing of incisions  assess q shift and daily dressing changes     Discharge Planning:  Pt is uninsured. Pt to d/c to home with  his brother. Pt will need to be intermittent supervision since brother works for a few hours during the week. SW will confirm no barriers to d/c.   Team Discussion: Polytrauma patient. Will do CPM for right knee. Continent B/B. Discharging home with brother. Patient is uninsured. Incisions are CDI. Working on modifications for [employer] and working with Merchant navy officer. Use of Beasy board for transfers is going well. Did well with 1-arm WC. Currently max assist with beasy board transfer. Total assist for toileting. Patient on target to meet rehab goals: yes, min assist goals.  *See Care Plan and progress notes for long and short-term goals.   Revisions to Treatment Plan:  Not at this time.  Teaching Needs: Family education, medication/pain management, skin/wound care, safety awareness, transfer training, etc.  Current Barriers to Discharge: Decreased caregiver support, Home enviroment access/layout, Wound care, Lack of/limited family support, Weight, Weight bearing restrictions, Medication compliance, and insurance.  Possible Resolutions to Barriers: Family education Follow-up PT/OT or HEP Order recommended DME     Medical Summary Current Status: polytrauma, mild TBI. bilateral knee ligamentous injury. right forearm fx, pelvic ring fx  Barriers to Discharge: Medical stability   Possible Resolutions to Barriers/Weekly Focus: pain mgt, wound care, daily assessment of labs and patient data  Continued Need for Acute Rehabilitation Level of Care: The patient requires daily medical management by a physician with specialized training in physical medicine and rehabilitation for the following reasons: Direction of a multidisciplinary physical rehabilitation program to maximize functional independence : Yes Medical management of patient stability for increased activity during participation in an intensive rehabilitation regime.: Yes Analysis of laboratory values and/or radiology reports with any  subsequent need for medication adjustment and/or medical intervention. : Yes   I attest that I was present, lead the team conference, and concur with the assessment and plan of the team.   Tennis Must 09/12/2021, 1:55 PM

## 2021-09-12 NOTE — Progress Notes (Signed)
Occupational Therapy Session Note  Patient Details  Name: Devin Jones MRN: [id number] Date of Birth: Aug 18, 1966  Today's Date: 09/12/2021 OT Individual Time: 1130-1200 OT Individual Time Calculation (min): 30 min    Short Term Goals: Week 1:  OT Short Term Goal 1 (Week 1): Patient will complete BSC transfer with Max A of 2 OT Short Term Goal 2 (Week 1): Pt will tolerate being OOB for 30 minutes OT Short Term Goal 3 (Week 1): Patient will demonstrate use of LH reacher to help thread pants  Skilled Therapeutic Interventions/Progress Updates:  Patient met seated in wc in agreement with OT treatment session. 0/10 pain reported at rest and with activity but patient reporting fatigue from sitting in wc. Patient with request to complete hair washing task at sink level. OT provided Total A for completion of task. Patient demonstrates ability to maneuver wc (some assist 2/2 spatial constraints of hospital room) in prep for transfer back to bed. Max A with use of beasy board (max A also needed to position board) for transfer from wc to EOB. Patient also required external assist to advance BLE from EOB to bed surface without use of makeshift leg lifter. Able to control descent of trunk without external assist. Session concluded with patient lying supine in bed with call bell within reach, bed alarm activated and all needs met.   Therapy Documentation Precautions:  Precautions Precautions: Fall Precaution Comments: NWB BLE 6-8 weeks, WB through R elbow only Required Braces or Orthoses: Knee Immobilizer - Right, Other Brace Other Brace: L bledsoe brace unlocked, R KI locked in extension Restrictions Weight Bearing Restrictions: Yes RUE Weight Bearing: Weight bear through elbow only RLE Weight Bearing: Non weight bearing LLE Weight Bearing: Non weight bearing Other Position/Activity Restrictions: NO weight bearing on L LE due ot pelvic fractures per ortho, cleared for R knee ROM on 12/22  focus on quad sets, L knee full ROM in bledsoe brace, cleared for full B hip ROM, cleared for R shoulder ROM as tolerated, no R elbow extension with resistance General:    Therapy/Group: Individual Therapy  Eshaan Titzer R Howerton-Davis 09/12/2021, 11:30 AM

## 2021-09-12 NOTE — Progress Notes (Signed)
Physical Therapy Session Note  Patient Details  Name: Devin Jones MRN: [id number] Date of Birth: 1965/10/22  Today's Date: 09/12/2021 PT Individual Time: 1002-1029 PT Individual Time Calculation (min): 27 min   Short Term Goals: Week 1:  PT Short Term Goal 1 (Week 1): Pt will tolerate sitting in WC >2 hourrs between therapies PT Short Term Goal 2 (Week 1): Pt will transfer to [facility] with mod assist and LRAD PT Short Term Goal 3 (Week 1): Pt will  initiate WC mobility with one arm drive WC.  Skilled Therapeutic Interventions/Progress Updates:     Pt received supine in bed and agrees to therapy. No complaint of pain. Pt perform supine to sit with bed features and verbal cues for sequencing and hand placement, light minA provided. Seated at EOB, PT cues pt for R side leaning onto elbow to assist with positioning beezy board. Once positioned on [facility] board, requiring modA, pt is able to perform remainder of transfer with CGA and cues to ensure pt maintains NWB status of both legs. PT also assists to remove beezy board once pt is in [facility]. Remainder of session devoted to [facility] training on single arm drive WC. Initially pt requires minA to maneuver, but eventually is able to navigate with verbal cues for efficiency and management of steering WC. Pt able to self propel WC x150', including both L and R turns. Pt left seated in WC with alarm intact and all needs within reach.  Therapy Documentation Precautions:  Precautions Precautions: Fall Precaution Comments: NWB BLE 6-8 weeks, WB through R elbow only Required Braces or Orthoses: Knee Immobilizer - Right, Other Brace Other Brace: L bledsoe brace unlocked, R KI locked in extension Restrictions Weight Bearing Restrictions: Yes RUE Weight Bearing: Weight bear through elbow only RLE Weight Bearing: Non weight bearing LLE Weight Bearing: Non weight bearing Other Position/Activity Restrictions: NO weight bearing on L LE due ot pelvic fractures per  ortho, cleared for R knee ROM on 12/22 focus on quad sets, L knee full ROM in bledsoe brace, cleared for full B hip ROM, cleared for R shoulder ROM as tolerated, no R elbow extension with resistance   Therapy/Group: Individual Therapy  Beau Fanny, PT, DPT 09/12/2021, 3:00 PM

## 2021-09-12 NOTE — Progress Notes (Signed)
Physical Therapy TBI Note  Patient Details  Name: Devin Jones MRN: [id number] Date of Birth: 25-Nov-1965  Today's Date: 09/12/2021 PT Individual Time: 0800-0900 PT Individual Time Calculation (min): 60 min   Short Term Goals: Week 1:  PT Short Term Goal 1 (Week 1): Pt will tolerate sitting in WC >2 hourrs between therapies PT Short Term Goal 2 (Week 1): Pt will transfer to [facility] with mod assist and LRAD PT Short Term Goal 3 (Week 1): Pt will  initiate WC mobility with one arm drive WC.  Skilled Therapeutic Interventions/Progress Updates:     Patient in bed upon PT arrival. Patient alert and agreeable to PT session. Patient denied pain at beginning of session, mild intermittent pain with exercises that resolved at rest.  Focused session on increased independence with lower extremity exercises using a gait belt for AAROM. PT switched the one-arm drive wheels for L hand use during session.   Therapeutic Exercise: Patient performed the following exercises using a gait belt in L hand for self-assist with verbal and tactile cues for proper technique. -B quad sets 2x10 with 5 sec hold -B heel slides 2x10 gentle PROM on R, AAROM on L with Bledsoe brace  -B SLR 2x5 AAROM focused on starting with quad set -B hip abduction/adduction 2x5 PROM on R AROM on R -B glut sets 2x10 with 5 sec hold  Patient in bed at end of session with breaks locked, bed alarm set, and all needs within reach.   Therapy Documentation Precautions:  Precautions Precautions: Fall Precaution Comments: NWB BLE 6-8 weeks, WB through R elbow only Required Braces or Orthoses: Knee Immobilizer - Right, Other Brace Other Brace: L bledsoe brace unlocked, R KI locked in extension Restrictions Weight Bearing Restrictions: Yes RUE Weight Bearing: Weight bear through elbow only RLE Weight Bearing: Non weight bearing LLE Weight Bearing: Non weight bearing Other Position/Activity Restrictions: NO weight bearing on L LE due  ot pelvic fractures per ortho, cleared for R knee ROM on 12/22 focus on quad sets, L knee full ROM in bledsoe brace, cleared for full B hip ROM, cleared for R shoulder ROM as tolerated, no R elbow extension with resistance  Agitated Behavior Scale: TBI Observation Details Observation Environment: Patient's room Start of observation period - Date: 09/11/21 Start of observation period - Time: 0800 End of observation period - Date: 09/11/21 End of observation period - Time: 0900 Agitated Behavior Scale (DO NOT LEAVE BLANKS) Short attention span, easy distractibility, inability to concentrate: Absent Impulsive, impatient, low tolerance for pain or frustration: Absent Uncooperative, resistant to care, demanding: Absent Violent and/or threatening violence toward people or property: Absent Explosive and/or unpredictable anger: Absent Rocking, rubbing, moaning, or other self-stimulating behavior: Absent Pulling at tubes, restraints, etc.: Absent Wandering from treatment areas: Absent Restlessness, pacing, excessive movement: Absent Repetitive behaviors, motor, and/or verbal: Absent Rapid, loud, or excessive talking: Absent Sudden changes of mood: Absent Easily initiated or excessive crying and/or laughter: Absent Self-abusiveness, physical and/or verbal: Absent Agitated behavior scale total score: 14     Therapy/Group: Individual Therapy  [name] PT, DPT  09/12/2021, 12:54 PM

## 2021-09-12 NOTE — Progress Notes (Signed)
PROGRESS NOTE   No new issues. Pain generally controlled. Feels stronger today after a good night's sleep.   ROS: Patient denies fever, rash, sore throat, blurred vision, nausea, vomiting, diarrhea, cough, shortness of breath or chest pain, headache, or mood change.   Objective:   No results found. No results for input(s): WBC, HGB, HCT, PLT in the last 72 hours.  Recent Labs    09/11/21 0703  NA 139  K 3.9  CL 102  CO2 27  GLUCOSE 104*  BUN 19  CREATININE 0.87  CALCIUM 9.0    Intake/Output Summary (Last 24 hours) at 09/12/2021 0951 Last data filed at 09/12/2021 0943 Gross per 24 hour  Intake 980 ml  Output 2200 ml  Net -1220 ml        Physical Exam: Vital Signs Blood pressure 109/73, pulse 80, temperature 98.4 F (36.9 C), temperature source Oral, resp. rate 16, height 5\' 11"  (1.803 m), weight 82.5 kg, SpO2 95 %. Constitutional: No distress . Vital signs reviewed. HEENT: NCAT, EOMI, oral membranes moist Neck: supple Cardiovascular: RRR without murmur. No JVD    Respiratory/Chest: CTA Bilaterally without wheezes or rales. Normal effort    GI/Abdomen: BS +, non-tender, non-distended Ext: no clubbing, cyanosis, or edema Psych: pleasant and cooperative  Skin: scab over right eye/face, dry skin stable, post-op dressing RLE without draiange. Left hip dressings in place, sutured.  Ecchymoses LLE. KI and bledsoe fit appropriately Neuro:  Alert and oriented x 3. Normal insight and awareness. Intact Memory. Normal language and speech. Cranial nerve exam unremarkable. UE 5/5. LE 1/5 prox to 4/5 ADF/PF. No sensory deficits. No abnl tone Musculoskeletal: LE's limited by bracing, both LE's tender with ROM   Assessment/Plan: 1. Functional deficits which require 3+ hours per day of interdisciplinary therapy in a comprehensive inpatient rehab setting. Physiatrist is providing close team supervision and 24 hour management  of active medical problems listed below. Physiatrist and rehab team continue to assess barriers to discharge/monitor patient progress toward functional and medical goals  Care Tool:  Bathing    Body parts bathed by patient: Right arm, Chest, Abdomen, Front perineal area, Face   Body parts bathed by helper: Left arm, Buttocks Body parts n/a: Right upper leg, Left upper leg, Right lower leg, Left lower leg   Bathing assist Assist Level: Maximal Assistance - Patient 24 - 49%     Upper Body Dressing/Undressing Upper body dressing   What is the patient wearing?: Pull over shirt    Upper body assist Assist Level: Maximal Assistance - Patient 25 - 49%    Lower Body Dressing/Undressing Lower body dressing      What is the patient wearing?: Pants     Lower body assist Assist for lower body dressing: Total Assistance - Patient < 25%     Toileting Toileting Toileting Activity did not occur and hygiene only): N/A (no void or bm) (idependent with urinal)  Toileting assist Assist for toileting: Maximal Assistance - Patient 25 - 49% Assistive Device Comment: BSC via beasy board   Transfers Chair/bed transfer  Transfers assist  Chair/bed transfer activity did not occur: Safety/medical concerns  Chair/bed transfer assist level: Total  Assistance - Patient < 25%     Locomotion Ambulation   Ambulation assist   Ambulation activity did not occur: Safety/medical concerns          Walk 10 feet activity   Assist  Walk 10 feet activity did not occur: Safety/medical concerns        Walk 50 feet activity   Assist Walk 50 feet with 2 turns activity did not occur: Safety/medical concerns         Walk 150 feet activity   Assist Walk 150 feet activity did not occur: Safety/medical concerns         Walk 10 feet on uneven surface  activity   Assist Walk 10 feet on uneven surfaces activity did not occur: Safety/medical concerns          Wheelchair     Assist Is the patient using a wheelchair?: Yes      Wheelchair assist level: Total Assistance - Patient < 25% Max wheelchair distance: 150    Wheelchair 50 feet with 2 turns activity    Assist        Assist Level: Total Assistance - Patient < 25%   Wheelchair 150 feet activity     Assist      Assist Level: Total Assistance - Patient < 25%   Blood pressure 109/73, pulse 80, temperature 98.4 F (36.9 C), temperature source Oral, resp. rate 16, height 5\' 11"  (1.803 m), weight 82.5 kg, SpO2 95 %.  Medical Problem List and Plan: 1. Functional deficits secondary to mult trauma dn TBI and B/L pelvic fracture             -patient may not shower             -ELOS/Goals: 8-10 days- s/u to mod I- for transfers  -Continue CIR therapies including PT, OT, team conf today 2.  Impaired mobility: continue  Pharmaceutical: Lovenox--6 weeks total of AC recommended.              -antiplatelet therapy: N/A 3. Low back pain: Continue Tylenol qid, Gabapentin TID with robaxin prn. Add kpad             --oxycodone prn for severe pain              -- ice/heat prn for local  measures 4. Mood: LCSW to follow for evaluation and support.              -antipsychotic agents: N/A 5. Neuropsych: This patient is capable of making decisions on his own behalf. 6. Skin/Wound Care: Routine pressure relief measures.  7. Fluids/Electrolytes/Nutrition: Monitor I/O.               --encourage fluid intake, BUN stable to sl improved     8. Pelvic ring fracture s/p SI nail: To be NWB BLE with bed to chair slide/lift transfers for 8 weeks. 9. R-Galeazzi fracture/dislocation s/p ORIF: NWB RUE--ok to WB thru elbow             --keep cast C/D 10 Right knee dislocation s/p MCL/posterior obique ligament repair: KI in place.    - PROM with therapy, CPM ordered by ortho 0-30 deg 11. Left knee PCL/ACL derangement: Hinged brace for support and reconstruction in 4-6 weeks.   12. Metabolic bone  disease: continue Vitamin D for supplement.  13. ABLA: hgb up to 11.4 today, some concentration effect. 14. Abnormal LFTS: improving    - Likely reactive, f/u prior to dc 15. Thrombocytopenia: resolved  16. Hyperglycemia: Stress induced v/s pre-diabetes.   hgb A1C 5.5  17. Hypoalbuminemia: Protein supplements added.     LOS: 5 days A FACE TO FACE EVALUATION WAS PERFORMED  Ranelle Oyster 09/12/2021, 9:51 AM

## 2021-09-12 NOTE — Progress Notes (Signed)
Patient ID: Devin Jones, male   DOB: 1965/11/03, 55 y.o.   MRN: [id number]  SW met with pt in room to provide updates from team conference, and d/c date to 09/27/21. SW discussed with him support he will have at time of d/c. Pt states that his brother is able to support, however, he had a stroke and has no function in left arm, and states his SIL and nephew will be there to help but they work FT. SW discussed with pt barriers to discharge being that he is uninsured. SW explained charity DME and MATCH program. SW informed him will confirm what DME he may need. SW encouraged him to look for : DABSC and RW. Pt states his brother has a walk in shower and believes there is a built-in seat. SW encouraged him to speak with his brother about coming in for family edu.   Loralee Pacas, MSW, [place] Office: [phone] Cell: [phone] Fax: [phone]

## 2021-09-12 NOTE — Progress Notes (Signed)
Occupational Therapy Session Note  Patient Details  Name: Devin Jones MRN: [id number] Date of Birth: July 19, 1966  Today's Date: 09/12/2021 OT Individual Time: 1400-1500 OT Individual Time Calculation (min): 60 min    Short Term Goals: Week 1:  OT Short Term Goal 1 (Week 1): Patient will complete BSC transfer with Max A of 2 OT Short Term Goal 2 (Week 1): Pt will tolerate being OOB for 30 minutes OT Short Term Goal 3 (Week 1): Patient will demonstrate use of LH reacher to help thread pants  Skilled Therapeutic Interventions/Progress Updates:    Pt greeted semi-reclined in bed and agreeable to OT treatment session focused on self-care retraining at bed level using AE. Pt able to shift weight side to side to pull pants down hips, then used reacher to doff off of legs. Pt able to bend L knee to alsp help maneuver feet out of pants legs OT prpvided tips and education hroughout to make dressing task easier. Pt utilized bed functions pretty heavily and was unable to come into long sitting from bed at this time. Rolling to wash buttocks with min A. OT issued dressing stick as well and educated on use. Pt brought to sitting EOB with min A LE,s and min A to elevate trunk. Once sitting, worked on [employer] bathing/dressing with improved reach of R arm to wash under L. Pt then donned clean shirt with min A. Set-up A for grooming tasks in sitting. Max A to lift LE;s back into bed and bed placed in [place] to scoot towards HOB. Pt left semi-reclined in bed with bed alarm on, call bell in reach, and needs met.   Therapy Documentation Precautions:  Precautions Precautions: Fall Precaution Comments: NWB BLE 6-8 weeks, WB through R elbow only Required Braces or Orthoses: Knee Immobilizer - Right, Other Brace Other Brace: L bledsoe brace unlocked, R KI locked in extension Restrictions Weight Bearing Restrictions: Yes RUE Weight Bearing: Weight bear through elbow only RLE Weight Bearing: Non weight  bearing LLE Weight Bearing: Non weight bearing Other Position/Activity Restrictions: NO weight bearing on L LE due ot pelvic fractures per ortho, cleared for R knee ROM on 12/22 focus on quad sets, L knee full ROM in bledsoe brace, cleared for full B hip ROM, cleared for R shoulder ROM as tolerated, no R elbow extension with resistance Pain:  Denies pain   Therapy/Group: Individual Therapy  Valma Cava 09/12/2021, 3:17 PM

## 2021-09-13 NOTE — Progress Notes (Signed)
Occupational Therapy Session Note  Patient Details  Name: Devin Jones MRN: [id number] Date of Birth: 1966-01-02  Today's Date: 09/13/2021 OT Individual Time: 1300-1400 session 1  OT Individual Time Calculation (min): 60 min  Session 2: [id number] ( session 2)    Short Term Goals: Week 1:  OT Short Term Goal 1 (Week 1): Patient will complete BSC transfer with Max A of 2 OT Short Term Goal 2 (Week 1): Pt will tolerate being OOB for 30 minutes OT Short Term Goal 3 (Week 1): Patient will demonstrate use of LH reacher to help thread pants  Skilled Therapeutic Interventions/Progress Updates:  Session 1: Pt greeted supine in bed  agreeable to OT intervention. Session focus on functional mobility, core strength and endurance and decreasing overall caregiver burden.  Pt completed supine>sit with CGA and increased time and effort with use bed features. Pt completed SB transfer from EOB>to w/c with SB with washcloth placed under hip with MIN A, total A to place board and washcloth with total A ( wash cloth used to decrease fricition with scooting). Pt completed w/c propulsion from room to gym with supervision. Pt completed additional SB transfer to R side with MIN A as w/c began to start moving. Worked on seated core strength therapeutic activitie such as modified crunched on therapy ball 2x10 reps. Pt complete russian twists for 1 min. Pt transitioned into supine from EOM with MODA. Pt laid in supine for ~ 2 min to stretch hip flexors as pt reports increased pain/tightness in  hip flexors. After rest break pt able to complete x10 reps of upper ab crutches from supine. Pt able to transition supine>long sitting with MODA. Pt completed 2 more additional SB transfers to R side with overall MIN A, total A to place board. Pt needed MOD A to transition sit>supine needing assist to elevate BLEs.  pt left supine in bed with all needs within reach with [facility] elevated flat to stretch hip flexors.                    Session 2: Pt greeted supine in bed  agreeable to OT intervention. Session focus on BADL reeducation, dynamic sitting balance, bed mobility and decreasing overall caregiver burden. Pt request to wash up EOB. CGA for supine>sit with use of bed features. Pt completed UB bathing from EOB with MIN A and UB dressing with set- up assist. Pt declined LB bathing. Pt needed MOD A for sit>supine with pt inquiring how to work on lifting BLEs, education provided on using gait belt as needed if necessary but trying to use muscle strength as much as possible at first before utilizing compensatory methods.  pt left supine in bed with all needs within reach.                    Therapy Documentation Precautions:  Precautions Precautions: Fall Precaution Comments: NWB BLE 6-8 weeks, WB through R elbow only Required Braces or Orthoses: Knee Immobilizer - Right, Other Brace Other Brace: L bledsoe brace unlocked, R KI locked in extension Restrictions Weight Bearing Restrictions: Yes RUE Weight Bearing: Weight bear through elbow only RLE Weight Bearing: Non weight bearing LLE Weight Bearing: Non weight bearing Other Position/Activity Restrictions: NO weight bearing on L LE due ot pelvic fractures per ortho, cleared for R knee ROM on 12/22 focus on quad sets, L knee full ROM in bledsoe brace, cleared for full B hip ROM, cleared for R shoulder ROM as tolerated, no R  elbow extension with resistance   Pain: Session 1: unrated pain in bil hip flexors, stretching and rest provided as needed Session 2: unrated pain in R hip flexor, no intervention needed     Therapy/Group: Individual Therapy  Barron Schmid 09/13/2021, 3:55 PM

## 2021-09-13 NOTE — Progress Notes (Signed)
Occupational Therapy Session Note  Patient Details  Name: Devin Jones MRN: [id number] Date of Birth: 22-Dec-1965  Today's Date: 09/13/2021 OT Individual Time: 9611-6435 OT Individual Time Calculation (min): 65 min    Short Term Goals: Week 1:  OT Short Term Goal 1 (Week 1): Patient will complete BSC transfer with Max A of 2 OT Short Term Goal 2 (Week 1): Pt will tolerate being OOB for 30 minutes OT Short Term Goal 3 (Week 1): Patient will demonstrate use of LH reacher to help thread pants     Skilled Therapeutic Interventions/Progress Updates:    Pt seen this session for problem solving best transfer strategies.  Discussed what he has been doing in his room and then went to gym to practice various techniques.   After several trial and errors, the easiest method for him to transfer is with the regular slide board sliding to his RIGHT side so he can push away with his L arm.  A wash cloth placed under his thighs on the board allows him to slide much more easily.  Pt can lean onto his L elbow to get a higher R hip lift to place board more easily. Pt tried this technique on the mat and again wc to bed.  To move to supine, used gait belt on L leg to help lift his leg as therapist helped lift R leg.   Discussed with pt recommendations to have someone home with him during the day.  His need for assist will be temporary.    Pt resting in bed with all needs met. Alarm set up.  Therapy Documentation Precautions:  Precautions Precautions: Fall Precaution Comments: NWB BLE 6-8 weeks, WB through R elbow only Required Braces or Orthoses: Knee Immobilizer - Right, Other Brace Other Brace: L bledsoe brace unlocked, R KI locked in extension Restrictions Weight Bearing Restrictions: Yes RUE Weight Bearing: Weight bear through elbow only RLE Weight Bearing: Non weight bearing LLE Weight Bearing: Non weight bearing Other Position/Activity Restrictions: NO weight bearing on L LE due ot pelvic  fractures per ortho, cleared for R knee ROM on 12/22 focus on quad sets, L knee full ROM in bledsoe brace, cleared for full B hip ROM, cleared for R shoulder ROM as tolerated, no R elbow extension with resistance   Pain: Pain Assessment Pain Scale: 0-10 Pain Score: 0-No pain ADL: ADL Eating: Minimal assistance Grooming: Minimal assistance Upper Body Bathing: Moderate assistance Lower Body Bathing: Maximal assistance Where Assessed-Lower Body Bathing: Bed level Upper Body Dressing: Moderate assistance Where Assessed-Upper Body Dressing: Edge of bed Lower Body Dressing: Dependent Where Assessed-Lower Body Dressing: Bed level Toileting: Unable to assess Toilet Transfer: Unable to assess       Therapy/Group: Individual Therapy  [place] 09/13/2021, 12:22 PM

## 2021-09-13 NOTE — Progress Notes (Signed)
Physical Therapy Session Note  Patient Details  Name: Devin Jones MRN: [id number] Date of Birth: [birth year]  Today's Date: 09/13/2021 PT Individual Time: 3614-4315 PT Individual Time Calculation (min): 85 min   Short Term Goals: Week 1:  PT Short Term Goal 1 (Week 1): Pt will tolerate sitting in WC >2 hourrs between therapies PT Short Term Goal 2 (Week 1): Pt will transfer to [facility] with mod assist and LRAD PT Short Term Goal 3 (Week 1): Pt will  initiate WC mobility with one arm drive WC.  Skilled Therapeutic Interventions/Progress Updates:     Pt received supine in bed and agrees to therapy. No complaint of pain. Pt performs supine to sit with bed features and cues for body mechanics and sequencing. PT provides cues for use of R elbow to allow R side-leaning for placement of beezy board under L hip. Pt requires modA to place board but able to perform remainder of transfer with CGA and PT assistance to remove board. Pt self propels WC to gym, x100', with cues for navigation. PT demonstrates sliding board transfer to pt as well as use of head hips relationship. Pt then performs transfer to mat table with sliding board, requiring maxA to complete. Pt mentions some discomfort in L side of pelvis initially with sliding board providing ground reaction force. Number not provided. PT provides rest break following transfer to manage pain. Pt performs sit to supine with modA and management of bilateral lower extremities. Pt remains supine to allow for passive stretch of bilateral hip flexors. Pt then performs 1x10 ankle pumps, quad sets, and glute sets. PT performs gentle PROM of R knee up to ~30 degrees flexion with special emphasis on ensuring no valgus stress placed on knee. Pt mentions pressure in knee toward end range of flexion but no overt pain symptoms. 2x10 reps of PROM and 2x10 reps AROM heel slides with L lower extremity. PT provides cueing on correct performance and ensuring L hip remains in  neutral rotation during exercise. Pt performs 1x10 SLRs with AAROM for L and R lower extremity. Pt then performs supine to sit transition using "crunch" technique to position himself in long sitting. Pt requires maxA for initiation of trunk and hip flexion but after first 1/3 of transition, pt able to engage core more effectively and only requires modA. Pt then performs scooting of hips and legs to perform AP transfer back to [facility], requiring cues on sequencing, lateral trunk leaning, and maxA manual facilitation to complete. WC transport back to room. PT discusses core strengthening exercises pt can perform in room, including engaging pelvic floor and transverse abdominus, with pt able to demonstrate back. Pt left seated in WC with alarm intact and all needs within reach.  Therapy Documentation Precautions:  Precautions Precautions: Fall Precaution Comments: NWB BLE 6-8 weeks, WB through R elbow only Required Braces or Orthoses: Knee Immobilizer - Right, Other Brace Other Brace: L bledsoe brace unlocked, R KI locked in extension Restrictions Weight Bearing Restrictions: Yes RUE Weight Bearing: Weight bear through elbow only RLE Weight Bearing: Non weight bearing LLE Weight Bearing: Non weight bearing Other Position/Activity Restrictions: NO weight bearing on L LE due ot pelvic fractures per ortho, cleared for R knee ROM on 12/22 focus on quad sets, L knee full ROM in bledsoe brace, cleared for full B hip ROM, cleared for R shoulder ROM as tolerated, no R elbow extension with resistance    Therapy/Group: Individual Therapy  Beau Fanny, PT, DPT 09/13/2021, 2:27  PM

## 2021-09-13 NOTE — [place] FL2 (Signed)
[place] MEDICAID FL2 LEVEL OF CARE SCREENING TOOL     IDENTIFICATION  Patient Name: Devin Jones Birthdate: 02/08/1966 Sex: male Admission Date (Current Location): 09/07/2021  [facility] and [place] Number:  Producer, television/film/video and Address:  [facility] [place], [address], [place], [place] [address]      Provider Number: [id number]  Attending Physician Name and Address:  Ranelle Oyster, MD  Relative Name and Phone Number:  Sherlene Shams (brother) #[phone]    Current Level of Care: Hospital Recommended Level of Care: Skilled Nursing Facility Prior Approval Number:    Date Approved/Denied:   PASRR Number: [id number]  Discharge Plan: SNF    Current Diagnoses: Patient Active Problem List   Diagnosis Date Noted   Right knee dislocation, subsequent encounter    TBI (traumatic brain injury) 09/07/2021   Multiple trauma 09/07/2021   Pelvic fracture (HCC) 08/26/2021    Orientation RESPIRATION BLADDER Height & Weight     Self, Time, Situation, Place  Normal Continent Weight: 181 lb 14.1 oz (82.5 kg) Height:  5\' 11"  (180.3 cm)  BEHAVIORAL SYMPTOMS/MOOD NEUROLOGICAL BOWEL NUTRITION STATUS      Continent Diet  AMBULATORY STATUS COMMUNICATION OF NEEDS Skin   Extensive Assist Verbally Other (Comment) (orthopedic surgeries; Change pelvis dressings daily with new mepilex. Ok to clean with soap and water only)                       Personal Care Assistance Level of Assistance  Bathing, Dressing Bathing Assistance: Limited assistance Feeding assistance: Independent Dressing Assistance: Limited assistance     Functional Limitations Info    Sight Info: Adequate        SPECIAL CARE FACTORS FREQUENCY  PT (By licensed PT), OT (By licensed OT)     PT Frequency: 5xs per week OT Frequency: 5xs per week            Contractures Contractures Info: Not present    Additional Factors Info  Code Status, Allergies Code Status Info:  Full Allergies Info: NKDA           Current Medications (09/13/2021):  This is the current hospital active medication list Current Facility-Administered Medications  Medication Dose Route Frequency Provider Last Rate Last Admin   acetaminophen (TYLENOL) tablet 325-650 mg  325-650 mg Oral Q4H PRN 09/15/2021, PA-C   650 mg at 09/13/21 1427   acetaminophen (TYLENOL) tablet 650 mg  650 mg Oral Q6H 09/15/21, PA-C   650 mg at 09/13/21 0347   alum & mag hydroxide-simeth (MAALOX/MYLANTA) 200-200-20 MG/5ML suspension 30 mL  30 mL Oral Q4H PRN Love, Pamela S, PA-C       ascorbic acid (VITAMIN C) tablet 500 mg  500 mg Oral Daily 06-15-1986, PA-C   500 mg at 09/13/21 09/15/21   bisacodyl (DULCOLAX) suppository 10 mg  10 mg Rectal Daily PRN 2683 S, PA-C       cholecalciferol (VITAMIN D3) tablet 2,000 Units  2,000 Units Oral BID M, PA-C   2,000 Units at 09/13/21 09/15/21   diphenhydrAMINE (BENADRYL) 12.5 MG/5ML elixir 12.5-25 mg  12.5-25 mg Oral Q6H PRN 10-17-1984 S, PA-C       enoxaparin (LOVENOX) injection 40 mg  40 mg Subcutaneous Q24H Love, Pamela S, PA-C   40 mg at 09/13/21 09/15/21   folic acid (FOLVITE) tablet 1 mg  1 mg Oral Daily Love, 2229, PA-C  1 mg at 09/13/21 0716   gabapentin (NEURONTIN) capsule 300 mg  300 mg Oral TID Love, Pamela S, PA-C   300 mg at 09/13/21 1427   guaiFENesin-dextromethorphan (ROBITUSSIN DM) 100-10 MG/5ML syrup 5-10 mL  5-10 mL Oral Q6H PRN Love, Pamela S, PA-C       menthol-cetylpyridinium (CEPACOL) lozenge 3 mg  1 lozenge Oral PRN Love, Pamela S, PA-C       methocarbamol (ROBAXIN) tablet 500 mg  500 mg Oral Q6H PRN Jacquelynn Cree, PA-C   500 mg at 09/13/21 4742   multivitamin with minerals tablet 1 tablet  1 tablet Oral Daily Jacquelynn Cree, PA-C   1 tablet at 09/13/21 5956   oxyCODONE (Oxy IR/ROXICODONE) immediate release tablet 10 mg  10 mg Oral Q4H PRN Love, Pamela S, PA-C       polyethylene glycol (MIRALAX / GLYCOLAX) packet 17 g  17 g  Oral BID Delle Reining S, PA-C   17 g at 09/13/21 3875   polyethylene glycol (MIRALAX / GLYCOLAX) packet 17 g  17 g Oral Daily PRN Love, Pamela S, PA-C       prochlorperazine (COMPAZINE) tablet 5-10 mg  5-10 mg Oral Q6H PRN Love, Pamela S, PA-C       Or   prochlorperazine (COMPAZINE) injection 5-10 mg  5-10 mg Intramuscular Q6H PRN Love, Pamela S, PA-C       Or   prochlorperazine (COMPAZINE) suppository 12.5 mg  12.5 mg Rectal Q6H PRN Love, Pamela S, PA-C       selenium sulfide (SELSUN) 1 % shampoo   Topical Daily Jacquelynn Cree, PA-C   Given at 09/13/21 6433   senna-docusate (Senokot-S) tablet 2 tablet  2 tablet Oral QPC supper Jacquelynn Cree, [place]   2 tablet at 09/11/21 1718   simethicone (MYLICON) chewable tablet 80 mg  80 mg Oral QID PRN Jacquelynn Cree, PA-C       sodium phosphate (FLEET) 7-19 GM/118ML enema 1 enema  1 enema Rectal Once PRN Love, Pamela S, PA-C       thiamine tablet 100 mg  100 mg Oral Daily Delle Reining S, PA-C   100 mg at 09/13/21 2951   Or   thiamine (B-1) injection 100 mg  100 mg Intravenous Daily Jacquelynn Cree, PA-C   100 mg at 09/11/21 0801   traZODone (DESYREL) tablet 25-50 mg  25-50 mg Oral QHS PRN Jacquelynn Cree, PA-C   50 mg at 09/10/21 2150     Discharge Medications: Please see discharge summary for a list of discharge medications.  Relevant Imaging Results:  Relevant Lab Results:   Additional Information SS # [id number]  [name], LCSW

## 2021-09-13 NOTE — Progress Notes (Signed)
Orthopedic Tech Progress Note Patient Details:  Rohaan Durnil [birth year] [id number]  CPM Right Knee CPM Right Knee: On Right Knee Flexion (Degrees): 0 Right Knee Extension (Degrees): 35  Post Interventions Patient Tolerated: Well Instructions Provided: Care of device, Poper ambulation with device  Eder Macek 09/13/2021, 9:00 PM

## 2021-09-13 NOTE — Progress Notes (Signed)
Patient ID: Devin Jones, male   DOB: May 27, 1966, 55 y.o.   MRN: [id number]  SW received phone call from pt brother Amada Jupiter who was inquiring about alternative options for patient. SW explained to his brother that options for pt were very limited since he does not have insurance. SW discussed LOG however if approved it does not guarantee that there will be a SNF that is willing to accept. SW also informed on charity HH being explored during the week of discharge. SW encouraged family edu for the family to make an informed decision on if they are able to provide care to patient.   SW emailed [facility] supervisor- Wandra Mannan to see if pt is eligible for LOG.  NCPASRR#: [id number]   Cecile Sheerer, MSW, LCSWA Office: [phone] Cell: [phone] Fax: [phone]

## 2021-09-14 NOTE — Progress Notes (Signed)
PROGRESS NOTE   In good spirits. Pain still controlled. Moving bowels  ROS: Patient denies fever, rash, sore throat, blurred vision, nausea, vomiting, diarrhea, cough, shortness of breath or chest pain,  headache, or mood change. .   Objective:   No results found. No results for input(s): WBC, HGB, HCT, PLT in the last 72 hours.  No results for input(s): NA, K, CL, CO2, GLUCOSE, BUN, CREATININE, CALCIUM in the last 72 hours.   Intake/Output Summary (Last 24 hours) at 09/14/2021 0909 Last data filed at 09/14/2021 0850 Gross per 24 hour  Intake 560 ml  Output 1450 ml  Net -890 ml        Physical Exam: Vital Signs Blood pressure 109/80, pulse 78, temperature 97.8 F (36.6 C), temperature source Oral, resp. rate 18, height 5\' 11"  (1.803 m), weight 82.5 kg, SpO2 98 %. Constitutional: No distress . Vital signs reviewed. HEENT: NCAT, EOMI, oral membranes moist Neck: supple Cardiovascular: RRR without murmur. No JVD    Respiratory/Chest: CTA Bilaterally without wheezes or rales. Normal effort    GI/Abdomen: BS +, non-tender, non-distended Ext: no clubbing, cyanosis, or edema Psych: pleasant and cooperative  Skin: scabs, dry skin. Surgical incisions CDI with dressings in place Neuro:  Alert and oriented x 3. Normal insight and awareness. Intact Memory. Normal language and speech. Cranial nerve exam unremarkable. UE 5/5. LE 1/5 prox to 4/5 ADF/PF. No sensory deficits. No abnl tone Musculoskeletal: LE's limited by bracing, both LE's tender with ROM   Assessment/Plan: 1. Functional deficits which require 3+ hours per day of interdisciplinary therapy in a comprehensive inpatient rehab setting. Physiatrist is providing close team supervision and 24 hour management of active medical problems listed below. Physiatrist and rehab team continue to assess barriers to discharge/monitor patient progress toward functional and medical  goals  Care Tool:  Bathing    Body parts bathed by patient: Right arm, Chest, Abdomen, Front perineal area, Face   Body parts bathed by helper: Left arm, Buttocks Body parts n/a: Right upper leg, Left upper leg, Right lower leg, Left lower leg   Bathing assist Assist Level: Moderate Assistance - Patient 50 - 74%     Upper Body Dressing/Undressing Upper body dressing   What is the patient wearing?: Pull over shirt    Upper body assist Assist Level: Minimal Assistance - Patient > 75%    Lower Body Dressing/Undressing Lower body dressing      What is the patient wearing?: Underwear/pull up, Pants     Lower body assist Assist for lower body dressing: Moderate Assistance - Patient 50 - 74%     Toileting Toileting Toileting Activity did not occur and hygiene only): N/A (no void or bm) (idependent with urinal)  Toileting assist Assist for toileting: Maximal Assistance - Patient 25 - 49% Assistive Device Comment: BSC via beasy board   Transfers Chair/bed transfer  Transfers assist  Chair/bed transfer activity did not occur: Safety/medical concerns  Chair/bed transfer assist level: Minimal Assistance - Patient > 75% (SB txfr)     Locomotion Ambulation   Ambulation assist   Ambulation activity did not occur: Safety/medical concerns  Walk 10 feet activity   Assist  Walk 10 feet activity did not occur: Safety/medical concerns        Walk 50 feet activity   Assist Walk 50 feet with 2 turns activity did not occur: Safety/medical concerns         Walk 150 feet activity   Assist Walk 150 feet activity did not occur: Safety/medical concerns         Walk 10 feet on uneven surface  activity   Assist Walk 10 feet on uneven surfaces activity did not occur: Safety/medical concerns         Wheelchair     Assist Is the patient using a wheelchair?: Yes      Wheelchair assist level: Total Assistance - Patient <  25% Max wheelchair distance: 150    Wheelchair 50 feet with 2 turns activity    Assist        Assist Level: Total Assistance - Patient < 25%   Wheelchair 150 feet activity     Assist      Assist Level: Total Assistance - Patient < 25%   Blood pressure 109/80, pulse 78, temperature 97.8 F (36.6 C), temperature source Oral, resp. rate 18, height 5\' 11"  (1.803 m), weight 82.5 kg, SpO2 98 %.  Medical Problem List and Plan: 1. Functional deficits secondary to mult trauma dn TBI and B/L pelvic fracture             -patient may not shower             -ELOS/Goals: 8-10 days- s/u to mod I- for transfers  -Continue CIR therapies including PT, OT   -seeking SNF placement but pt is uninsured 2.  Impaired mobility: continue  Pharmaceutical: Lovenox--6 weeks total of AC recommended.              -antiplatelet therapy: N/A 3. Pain control: Continue Tylenol qid, Gabapentin TID with robaxin prn. Add kpad             --oxycodone prn for severe pain              -- ice/heat prn for local  measures  -pain controlled at present 4. Mood: LCSW to follow for evaluation and support.              -antipsychotic agents: N/A 5. Neuropsych: This patient is capable of making decisions on his own behalf. 6. Skin/Wound Care: Routine pressure relief measures.  7. Fluids/Electrolytes/Nutrition: Monitor I/O.               --encouraging fluid intake, BUN stable to sl improved     8. Pelvic ring fracture s/p SI nail: To be NWB BLE with bed to chair slide/lift transfers for 8 weeks. 9. R-Galeazzi fracture/dislocation s/p ORIF: NWB RUE--ok to WB thru elbow             --keep cast C/D 10 Right knee dislocation s/p MCL/posterior obique ligament repair: KI in place.    - PROM with therapy, CPM ordered by ortho 0-30 deg 11. Left knee PCL/ACL derangement: Hinged brace for support and reconstruction in 4-6 weeks.   12. Metabolic bone disease: continue Vitamin D for supplement.  13. ABLA: hgb up to 11.4  today, some concentration effect. 14. Abnormal LFTS: improving    - Likely reactive, f/u prior to dc--->recheck Friday 15. Thrombocytopenia: resolved  16. Hyperglycemia: Stress induced v/s pre-diabetes.   hgb A1C 5.5  17. Hypoalbuminemia: Protein supplements added.  LOS: 7 days A FACE TO FACE EVALUATION WAS PERFORMED  Ranelle Oyster 09/14/2021, 9:09 AM

## 2021-09-14 NOTE — Progress Notes (Signed)
Occupational Therapy Weekly Progress Note  Patient Details  Name: Devin Jones MRN: [id number] Date of Birth: 1965-12-17  Beginning of progress report period: September 08, 2021 End of progress report period: September 14, 2021  Today's Date: 09/14/2021 Session 1 OT Individual Time: 0905-1000 OT Individual Time Calculation (min): 55 min   Session 2 OT Individual Time: 1300-1400 OT Individual Time Calculation (min): 60 min    Patient has met 3 of 3 short term goals.  Patient is making steady progress towards OT goals. We have been problem solving functional transfers using beexy board and slideboard. He is progressing well with bed level LB ADLs requiring mod A. Toileting tasks are difficult due to weight bearing restrictions and restricted use of R hand. Continue current POC.  Patient continues to demonstrate the following deficits: muscle weakness and decreased sitting balance, decreased balance strategies, and difficulty maintaining precautions and therefore will continue to benefit from skilled OT intervention to enhance overall performance with BADL and Reduce care partner burden.  Patient progressing toward long term goals..  Continue plan of care.  OT Short Term Goals Week 1:  OT Short Term Goal 1 (Week 1): Patient will complete BSC transfer with Max A of 2 OT Short Term Goal 2 (Week 1): Pt will tolerate being OOB for 30 minutes OT Short Term Goal 3 (Week 1): Patient will demonstrate use of LH reacher to help thread pants Week 2:     Skilled Therapeutic Interventions/Progress Updates:  Session 1   Pt greeted semi-reclined in bed and agreeable to OT treatment session. Lower body bathing completed at bed level with use of reacher to help push down pants to the top of braces. Pt declined to change pants but just wanted to wash lower half. Pt able to roll to wash buttocks using bed rails and supervision. Set-up for wash cloth, then pt able to wash buttocks. Worked on coming  into long sitting in bed to get pants from knees with mod A and increased time to elevate, then min A for balance at trunk in long sitting without UE support. Pt transitioned to sitting EOB with min A. Pt performed slideboard transfer using wash cloth under thigh and pushing himself over to wc with min A. Pt with good head/hips relationship with this transfer. Pt then completed UB bathing/dressing and grooming tasks from wc at the sink with set-up A. Attempted slideboard transfer using wash cloth back to bed to Left side pushing through elbow on arm rest, but needed max A for this transfer and more difficulty with head/hips relationship. Pt returned to bed with max A to lift LE's and left semi-reclined in bed with bed alarm on, call bell in reach, and nedes met.    09/14/21 1200  Observation Details  Observation Environment CIR  Start of observation period - Date 09/14/21  Start of observation period - Time 0905  End of observation period - Date 09/14/21  End of observation period - Time 1000  Agitated Behavior Scale (DO NOT LEAVE BLANKS)  Short attention span, easy distractibility, inability to concentrate 1  Impulsive, impatient, low tolerance for pain or frustration 1  Uncooperative, resistant to care, demanding 1  Violent and/or threatening violence toward people or property 1  Explosive and/or unpredictable anger 1  Rocking, rubbing, moaning, or other self-stimulating behavior 1  Pulling at tubes, restraints, etc. 1  Wandering from treatment areas 1  Restlessness, pacing, excessive movement 1  Repetitive behaviors, motor, and/or verbal 1  Rapid, loud, or  excessive talking 1  Sudden changes of mood 1  Easily initiated or excessive crying and/or laughter 1  Self-abusiveness, physical and/or verbal 1  Agitated behavior scale total score 14   Pain:  Denies pain  Session 2 Pt greeted semi-reclined in bed and agreeable to OT treatment session. Worked on slideboard transfer from bed to  drop arm wc using slideboard and wash cloth  to push over to chair. Pt needed min A. Pt propelled one armed drive wc to therapy gym with focus on maneuvering wc into tighter doorways. Min A slideboard transfer in similar fashion to therapy mat. Addressed reciprocal scooting with focus on head/hips relationship using NDT techniques in preparation for transfer. Lateral leans using 2 pillows to lean onto R side and unweight L hip. Pt unable to push with R elbow to scoot across mat. Pt brought into supine with min A. R knee PROM with 3 x 10. Lateral scoot on slideboard on R side pushing across board with min A. Pt propelled wc back to room and left seated up in wc with alarm belt on, call bell in reach, and needs met.       09/14/21 1200  Observation Details  Observation Environment CIR  Start of observation period - Date 09/14/21  Start of observation period - Time 1300  End of observation period - Date 09/14/21  End of observation period - Time 1400  Agitated Behavior Scale (DO NOT LEAVE BLANKS)  Short attention span, easy distractibility, inability to concentrate 1  Impulsive, impatient, low tolerance for pain or frustration 1  Uncooperative, resistant to care, demanding 1  Violent and/or threatening violence toward people or property 1  Explosive and/or unpredictable anger 1  Rocking, rubbing, moaning, or other self-stimulating behavior 1  Pulling at tubes, restraints, etc. 1  Wandering from treatment areas 1  Restlessness, pacing, excessive movement 1  Repetitive behaviors, motor, and/or verbal 1  Rapid, loud, or excessive talking 1  Sudden changes of mood 1  Easily initiated or excessive crying and/or laughter 1  Self-abusiveness, physical and/or verbal 1  Agitated behavior scale total score 14    Therapy Documentation Precautions:  Precautions Precautions: Fall Precaution Comments: NWB BLE 6-8 weeks, WB through R elbow only Required Braces or Orthoses: Knee Immobilizer - Right,  Other Brace Other Brace: L bledsoe brace unlocked, R KI locked in extension Restrictions Weight Bearing Restrictions: Yes RUE Weight Bearing: Weight bear through elbow only RLE Weight Bearing: Non weight bearing LLE Weight Bearing: Non weight bearing Other Position/Activity Restrictions: NO weight bearing on L LE due ot pelvic fractures per ortho, cleared for R knee ROM on 12/22 focus on quad sets, L knee full ROM in bledsoe brace, cleared for full B hip ROM, cleared for R shoulder ROM as tolerated, no R elbow extension with resistance Pain:  Denies pain    Therapy/Group: Individual Therapy  Valma Cava 09/14/2021, 3:26 PM

## 2021-09-14 NOTE — Progress Notes (Signed)
Physical Therapy Session Note  Patient Details  Name: Devin Jones MRN: [id number] Date of Birth: [birth year]  Today's Date: 09/14/2021 PT Individual Time: 1432-1530 PT Individual Time Calculation (min): 58 min   Short Term Goals: Week 1:  PT Short Term Goal 1 (Week 1): Pt will tolerate sitting in WC >2 hourrs between therapies PT Short Term Goal 2 (Week 1): Pt will transfer to [facility] with mod assist and LRAD PT Short Term Goal 3 (Week 1): Pt will  initiate WC mobility with one arm drive WC.  Skilled Therapeutic Interventions/Progress Updates:     Pt received seated in [facility] and agrees to therapy. Reports some discomfort in low back as well as R shoulder. Number not provided. PT provides rest breaks as needed to manage pain. Pt self propels WC x150' with cues for navigation. Sliding board transfer from [facility] to mat table with towel over sliding board to decrease friction. Pt requires maxA overall for transfer with cues for body mechanics and sequencing. Much of session focused on NMR for core strengthening and balance. Pt initially performs posterior-anterior trunk leans with PT positioned behind pt on exercise ball to provide posterior limit to movement. Pt leans back and reaches for clothespins with L hand, then bring trunk back to neutral and rotates to the R to place clothespins on rack. Pt then performs seated to R side leaning, propping on R elbow, to reach across with L hand for clothespins, coming back up to neutral posture and placing clothespins on L side. PT provides cues for body mechanics and hand placement for safety. Pt requires supine rest break following activity due to fatigue, requiring modA for sit to supine and supervision for supine to sit with cues for sequencing and positioning. Finally, pt leans onto L elbow and reaches across body with R hand to retrieve clothespins, coming back up to neutral posture to place clothespins on R. ModA for sliding board transfers from mat>WC>bed.  Left supine with alarm intact and all needs within reach.  Therapy Documentation Precautions:  Precautions Precautions: Fall Precaution Comments: NWB BLE 6-8 weeks, WB through R elbow only Required Braces or Orthoses: Knee Immobilizer - Right, Other Brace Other Brace: L bledsoe brace unlocked, R KI locked in extension Restrictions Weight Bearing Restrictions: Yes RUE Weight Bearing: Weight bear through elbow only RLE Weight Bearing: Non weight bearing LLE Weight Bearing: Non weight bearing Other Position/Activity Restrictions: NO weight bearing on L LE due ot pelvic fractures per ortho, cleared for R knee ROM on 12/22 focus on quad sets, L knee full ROM in bledsoe brace, cleared for full B hip ROM, cleared for R shoulder ROM as tolerated, no R elbow extension with resistance    Therapy/Group: Individual Therapy  Beau Fanny, PT, DPT 09/14/2021, 3:55 PM

## 2021-09-14 NOTE — Progress Notes (Addendum)
Patient ID: Devin Jones, male   DOB: Dec 15, 1965, 55 y.o.   MRN: [id number]  SW received approval for LOG if SNF is needed.  SW sent out SNF referrals to see if any potential options.   *SW returned phone call to pt brother Jhalen [phone]) to discuss family edu. Family edu scheduled for Tuesday (1/3) 1pm-4pm.   Cecile Sheerer, MSW, LCSWA Office: [phone] Cell: [phone] Fax: [phone]

## 2021-09-15 DIAGNOSIS — R7989 Other specified abnormal findings of blood chemistry: Secondary | ICD-10-CM

## 2021-09-15 LAB — COMPREHENSIVE METABOLIC PANEL
ALT: 61 U/L — ABNORMAL HIGH (ref 0–44)
AST: 24 U/L (ref 15–41)
Albumin: 2.9 g/dL — ABNORMAL LOW (ref 3.5–5.0)
Alkaline Phosphatase: 515 U/L — ABNORMAL HIGH (ref 38–126)
Anion gap: 5 (ref 5–15)
BUN: 14 mg/dL (ref 6–20)
CO2: 27 mmol/L (ref 22–32)
Calcium: 9.1 mg/dL (ref 8.9–10.3)
Chloride: 106 mmol/L (ref 98–111)
Creatinine, Ser: 0.98 mg/dL (ref 0.61–1.24)
GFR, Estimated: >60 mL/min (ref >60)
Glucose, Bld: 103 mg/dL — ABNORMAL HIGH (ref 70–99)
Potassium: 5.1 mmol/L (ref 3.5–5.1)
Sodium: 138 mmol/L (ref 135–145)
Total Bilirubin: 0.8 mg/dL (ref 0.3–1.2)
Total Protein: 6.5 g/dL (ref 6.5–8.1)

## 2021-09-15 NOTE — Progress Notes (Signed)
Occupational Therapy TBI Note  Patient Details  Name: Devin Jones MRN: [id number] Date of Birth: Sep 19, 1965  Today's Date: 09/15/2021 OT Individual Time: 0849-1000 OT Individual Time Calculation (min): 71 min   Short Term Goals: Week 2:  OT Short Term Goal 1 (Week 2): Patient will perform BSC transfer with mod A of 1 OT Short Term Goal 2 (Week 2): Patient will thread pants using reacher with min A OT Short Term Goal 3 (Week 2): Patient will come into long sitting in bed with min A of 1 in preparation for BADL tasks  Skilled Therapeutic Interventions/Progress Updates:    Pt greeted semi-reclined in bed and agreeable to OT treatment session focsed on self-care retraining. Pt wanted to wash up and change clothes. Worked on shifting weight in bed to push pants down, then pt able to use reacher to get pants down to his feet. OT then provided mod A to lift trunk and get pt into long sitting. He then needed min A for sitting balance without UE support while using reacher to get pants the rest of the way off of feet. Pt then reported need to have a BM. Since pt was already in long sitting, tried AP transfer to wide drop arm BSC. Pt needed mod/max A with use of chuck to get to [facility]. Pt then was uncomfortable with leg position, so bed moved so pt's feet could dangle. Pt with succsesful BM and voided bladder. Worked on strategies for toileting. Pt was able to reach and wash the front with min A to open legs. Pt able to hike L hip over with leaning, but then needed OT assist to wash buttocks. Worked on threading pants seated on BSC using reacher and min A, then max A to get pants over hips with multiple lateral leans. Sideboard transfer to wc on L side using dysom to stop board from [place] and then pulling sccross board using armrests. UB bathing/dressing from wc at the sink with set-up. Slidebaord back to bed on R side with min A usingw ash cloth. OT assist to get Les back in bed. Pt left semi=reclined  in bed with bed alarm on, call bell in reach, and needs met.  Therapy Documentation Precautions:  Precautions Precautions: Fall Precaution Comments: NWB BLE 6-8 weeks, WB through R elbow only Required Braces or Orthoses: Knee Immobilizer - Right, Other Brace Other Brace: L bledsoe brace unlocked, R KI locked in extension Restrictions Weight Bearing Restrictions: Yes RUE Weight Bearing: Weight bear through elbow only RLE Weight Bearing: Non weight bearing LLE Weight Bearing: Non weight bearing Other Position/Activity Restrictions: NO weight bearing on L LE due ot pelvic fractures per ortho, cleared for R knee ROM on 12/22 focus on quad sets, L knee full ROM in bledsoe brace, cleared for full B hip ROM, cleared for R shoulder ROM as tolerated, no R elbow extension with resistance Pain:  Denies pain Agitated Behavior Scale: TBI Observation Details Observation Environment: CIR Start of observation period - Date: 09/15/21 Start of observation period - Time: 0849 End of observation period - Date: 09/15/21 End of observation period - Time: 1000 Agitated Behavior Scale (DO NOT LEAVE BLANKS) Short attention span, easy distractibility, inability to concentrate: Absent Impulsive, impatient, low tolerance for pain or frustration: Absent Uncooperative, resistant to care, demanding: Absent Violent and/or threatening violence toward people or property: Absent Explosive and/or unpredictable anger: Absent Rocking, rubbing, moaning, or other self-stimulating behavior: Absent Pulling at tubes, restraints, etc.: Absent Wandering from treatment areas: Absent  Restlessness, pacing, excessive movement: Absent Repetitive behaviors, motor, and/or verbal: Absent Rapid, loud, or excessive talking: Absent Sudden changes of mood: Absent Easily initiated or excessive crying and/or laughter: Absent Self-abusiveness, physical and/or verbal: Absent Agitated behavior scale total score: 14   Therapy/Group:  Individual Therapy  Valma Cava 09/15/2021, 9:48 AM

## 2021-09-15 NOTE — Progress Notes (Signed)
Occupational Therapy Session Note  Patient Details  Name: Devin Jones MRN: [id number] Date of Birth: 03-Dec-1965  Today's Date: 09/15/2021 OT Individual Time: 1110-1205 OT Individual Time Calculation (min): 55 min    Short Term Goals: Week 2:  OT Short Term Goal 1 (Week 2): Patient will perform BSC transfer with mod A of 1 OT Short Term Goal 2 (Week 2): Patient will thread pants using reacher with min A OT Short Term Goal 3 (Week 2): Patient will come into long sitting in bed with min A of 1 in preparation for BADL tasks  Skilled Therapeutic Interventions/Progress Updates:    Pt received supine with no c/o pain. Session focused on problem solving slideboard transfers and d/c planning. Demonstrated use of built up yoga blocks to allow R elbow weightbearing during slideboard transfers- secured blocks with co-band. Pt able to return demo, requiring no assist to laterally scoot, assist provided to support slideboard, w/c and yoga blocks. Pt completed blocked practice x4 of slideboard transfers with no more than CGA. Pt was encouraged by this progress. No need for use of towel to slide. Pt was then taken outside for mood and ego support. Discussed d/c planning while sitting outside. Pt was returned to his room and he completed a slideboard transfer back to bed. Pt was left supine with all needs met, bed alarm set.   Therapy Documentation Precautions:  Precautions Precautions: Fall Precaution Comments: NWB BLE 6-8 weeks, WB through R elbow only Required Braces or Orthoses: Knee Immobilizer - Right, Other Brace Other Brace: L bledsoe brace unlocked, R KI locked in extension Restrictions Weight Bearing Restrictions: Yes RUE Weight Bearing: Weight bear through elbow only RLE Weight Bearing: Non weight bearing LLE Weight Bearing: Non weight bearing Other Position/Activity Restrictions: NO weight bearing on L LE due ot pelvic fractures per ortho, cleared for R knee ROM on 12/22 focus on  quad sets, L knee full ROM in bledsoe brace, cleared for full B hip ROM, cleared for R shoulder ROM as tolerated, no R elbow extension with resistance   Therapy/Group: Individual Therapy  Curtis Sites 09/15/2021, 6:33 AM

## 2021-09-15 NOTE — Progress Notes (Signed)
Physical Therapy Weekly Progress Note  Patient Details  Name: Devin Jones MRN: [id number] Date of Birth: 1966/02/15  Beginning of progress report period: September 08, 2021 End of progress report period: September 15, 2021  Today's Date: 09/15/2021 PT Individual Time: 5784-6962 PT Individual Time Calculation (min): 56 min   Patient has met 3 of 3 short term goals.  Pt is progressing very well toward mobility goals, improving independence with bed mobility, sitting balance, bed to chair transfers, and WC mobility with single arm drive WC. Pt continues to have difficulty with core strength and associated activities, and will benefit from ongoing transfer training as well as family education prior to DC.  Patient continues to demonstrate the following deficits muscle weakness, decreased cardiorespiratoy endurance, and decreased sitting balance and decreased balance strategies and therefore will continue to benefit from skilled PT intervention to increase functional independence with mobility.  Patient progressing toward long term goals..  Continue plan of care.  PT Short Term Goals Week 1:  PT Short Term Goal 1 (Week 1): Pt will tolerate sitting in WC >2 hourrs between therapies PT Short Term Goal 1 - Progress (Week 1): Met PT Short Term Goal 2 (Week 1): Pt will transfer to [facility] with mod assist and LRAD PT Short Term Goal 2 - Progress (Week 1): Met PT Short Term Goal 3 (Week 1): Pt will  initiate WC mobility with one arm drive WC. PT Short Term Goal 3 - Progress (Week 1): Met Week 2:  PT Short Term Goal 1 (Week 2): Pt will perform bed mobility without bed features and with supervision. PT Short Term Goal 2 (Week 2): Pt will perform bed to chair trasnsfer with minA.  Skilled Therapeutic Interventions/Progress Updates:     Pt received supine in bed and agrees to therapy. No complaint of pain. Supine to sit with bed rails and cues for sequencing. PT discusses use of yoga blocks for R  elbow support during transfer. Pt then performs bed to chair transfer to the L with yoga blocks, sliding board, and modA overall. PT cues for body mechanics, positioning, and sequencing. Pt self propels WC x150' to gym without cues required. PT demonstrates alternate way o fusing yoga blocks to clear hips on sliding board, then pt attempts sliding board transfer to the R using this method, and is able to complete with minA overall. Pt transitions to prone with modA and cues for sequencing. Pt remains in prone for 6 minutes with intention of providing passive stretch to hip flexors and anterior muscle groups. PT cues for glute sets to increase hip extension and soft tissue stretch. Pt tolerates well and reports no pain.  Prone to supine with minA. Pt perform supine therex for bilateral lower extremities for ROM and strengthening as well as NMR. Pt performs 1x10: Ankle pumps Quad sets Glute sets LLE Heel slides LLE SAQs SLRs (AAROM) Hip Abduction (AROM) Supine to sit with cues for sequencing and positioning, but no physical assistance required. Sliding board transfer to/from [facility] with sliding board and yoga block bolster with minA overall. Pt self propels WC back to room. Sit to supine with modA management of bilateral lower extremities. Left with alarm intact and all needs within reach.  Therapy Documentation Precautions:  Precautions Precautions: Fall Precaution Comments: NWB BLE 6-8 weeks, WB through R elbow only Required Braces or Orthoses: Knee Immobilizer - Right, Other Brace Other Brace: L bledsoe brace unlocked, R KI locked in extension Restrictions Weight Bearing Restrictions: Yes RUE Weight  Bearing: Weight bear through elbow only RLE Weight Bearing: Non weight bearing LLE Weight Bearing: Non weight bearing Other Position/Activity Restrictions: NO weight bearing on L LE due ot pelvic fractures per ortho, cleared for R knee ROM on 12/22 focus on quad sets, L knee full ROM in bledsoe  brace, cleared for full B hip ROM, cleared for R shoulder ROM as tolerated, no R elbow extension with resistance   Therapy/Group: Individual Therapy  Breck Coons, PT, DPT 09/15/2021, 2:25 PM
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
## 2021-09-15 NOTE — Progress Notes (Signed)
PROGRESS NOTE   Feels that he's making incremental gains. Asked if he could advance CPM further  ROS: Patient denies fever, rash, sore throat, blurred vision, nausea, vomiting, diarrhea, cough, shortness of breath or chest pain, headache, or mood change.    Objective:   No results found. No results for input(s): WBC, HGB, HCT, PLT in the last 72 hours.  Recent Labs    09/15/21 0508  NA 138  K 5.1  CL 106  CO2 27  GLUCOSE 103*  BUN 14  CREATININE 0.98  CALCIUM 9.1     Intake/Output Summary (Last 24 hours) at 09/15/2021 0923 Last data filed at 09/15/2021 0831 Gross per 24 hour  Intake 1460 ml  Output 1380 ml  Net 80 ml        Physical Exam: Vital Signs Blood pressure 113/70, pulse 79, temperature 98 F (36.7 C), resp. rate 15, height 5\' 11"  (1.803 m), weight 82.5 kg, SpO2 96 %. Constitutional: No distress . Vital signs reviewed. HEENT: NCAT, EOMI, oral membranes moist Neck: supple Cardiovascular: RRR without murmur. No JVD    Respiratory/Chest: CTA Bilaterally without wheezes or rales. Normal effort    GI/Abdomen: BS +, non-tender, non-distended Ext: no clubbing, cyanosis, or edema Psych: pleasant and cooperative  Skin: scabs, dry skin. Surgical incisions CDI, foam dressings all in place Neuro:  Alert and oriented x 3. Normal insight and awareness. Intact Memory. Normal language and speech. Cranial nerve exam unremarkable. UE 5/5. LE 1/5 prox to 4/5 ADF/PF. No sensory deficits. No abnl tone Musculoskeletal: LE's limited by bracing, both LE's less tender with ROM   Assessment/Plan: 1. Functional deficits which require 3+ hours per day of interdisciplinary therapy in a comprehensive inpatient rehab setting. Physiatrist is providing close team supervision and 24 hour management of active medical problems listed below. Physiatrist and rehab team continue to assess barriers to discharge/monitor patient progress  toward functional and medical goals  Care Tool:  Bathing    Body parts bathed by patient: Right arm, Chest, Abdomen, Front perineal area, Face   Body parts bathed by helper: Left arm, Buttocks Body parts n/a: Right upper leg, Left upper leg, Right lower leg, Left lower leg   Bathing assist Assist Level: Moderate Assistance - Patient 50 - 74%     Upper Body Dressing/Undressing Upper body dressing   What is the patient wearing?: Pull over shirt    Upper body assist Assist Level: Minimal Assistance - Patient > 75%    Lower Body Dressing/Undressing Lower body dressing      What is the patient wearing?: Underwear/pull up, Pants     Lower body assist Assist for lower body dressing: Moderate Assistance - Patient 50 - 74%     Toileting Toileting Toileting Activity did not occur and hygiene only): N/A (no void or bm) (idependent with urinal)  Toileting assist Assist for toileting: Maximal Assistance - Patient 25 - 49% Assistive Device Comment: BSC via beasy board   Transfers Chair/bed transfer  Transfers assist  Chair/bed transfer activity did not occur: Safety/medical concerns  Chair/bed transfer assist level: Minimal Assistance - Patient > 75% (SB txfr)     Locomotion Ambulation  Ambulation assist   Ambulation activity did not occur: Safety/medical concerns          Walk 10 feet activity   Assist  Walk 10 feet activity did not occur: Safety/medical concerns        Walk 50 feet activity   Assist Walk 50 feet with 2 turns activity did not occur: Safety/medical concerns         Walk 150 feet activity   Assist Walk 150 feet activity did not occur: Safety/medical concerns         Walk 10 feet on uneven surface  activity   Assist Walk 10 feet on uneven surfaces activity did not occur: Safety/medical concerns         Wheelchair     Assist Is the patient using a wheelchair?: Yes      Wheelchair assist level:  Total Assistance - Patient < 25% Max wheelchair distance: 150    Wheelchair 50 feet with 2 turns activity    Assist        Assist Level: Total Assistance - Patient < 25%   Wheelchair 150 feet activity     Assist      Assist Level: Total Assistance - Patient < 25%   Blood pressure 113/70, pulse 79, temperature 98 F (36.7 C), resp. rate 15, height 5\' 11"  (1.803 m), weight 82.5 kg, SpO2 96 %.  Medical Problem List and Plan: 1. Functional deficits secondary to mult trauma dn TBI and B/L pelvic fracture             -patient may not shower             -ELOS/Goals: 8-10 days- s/u to mod I- for transfers  -Continue CIR therapies including PT, OT   -seeking SNF placement but pt is uninsured 2.  Impaired mobility: continue  Pharmaceutical: Lovenox--6 weeks total of AC recommended.              -antiplatelet therapy: N/A 3. Pain control: Continue Tylenol qid, Gabapentin TID with robaxin prn. Add kpad             --oxycodone prn for severe pain              -- ice/heat prn for local  measures  -12/30 pain remains controlled at present 4. Mood: LCSW to follow for evaluation and support.              -antipsychotic agents: N/A 5. Neuropsych: This patient is capable of making decisions on his own behalf. 6. Skin/Wound Care: Routine pressure relief measures.  7. Fluids/Electrolytes/Nutrition: Monitor I/O.               --encouraging fluid intake, BUN stable to sl improved   -12/30 BUN/Cr improved. Albumin increasing 8. Pelvic ring fracture s/p SI nail: To be NWB BLE with bed to chair slide/lift transfers for 8 weeks. 9. R-Galeazzi fracture/dislocation s/p ORIF: NWB RUE--ok to WB thru elbow             --keep cast C/D 10 Right knee dislocation s/p MCL/posterior obique ligament repair: KI in place.    - PROM with therapy, CPM ordered by ortho 0-30 deg   -12/30 checked with Dr. 1/31 about advancing cpm 11. Left knee PCL/ACL derangement: Hinged brace for support and reconstruction  in 4-6 weeks.   12. Metabolic bone disease: continue Vitamin D for supplement.  13. ABLA: hgb up to 11.4 today, some concentration effect. 14. Abnormal LFTS: improving    -12/30  Likely reactive, AP up but this is due to ortho/bony trauma   -remainder of LFT's improved 15. Thrombocytopenia: resolved  16. Hyperglycemia: Stress induced v/s pre-diabetes.   hgb A1C 5.5  17. Hypoalbuminemia: improving. Continue supps     LOS: 8 days A FACE TO FACE EVALUATION WAS PERFORMED  Ranelle Oyster 09/15/2021, 9:23 AM

## 2021-09-16 NOTE — Progress Notes (Signed)
PROGRESS NOTE  He asks about advancing CPM further- says he has discussed with Dr. Riley Kill yesterday and awaiting to hear back from ortho He has no other complaints  ROS: Patient denies fever, rash, sore throat, blurred vision, nausea, vomiting, diarrhea, cough, shortness of breath or chest pain, headache, or mood change.    Objective:   No results found. No results for input(s): WBC, HGB, HCT, PLT in the last 72 hours.  Recent Labs    09/15/21 0508  NA 138  K 5.1  CL 106  CO2 27  GLUCOSE 103*  BUN 14  CREATININE 0.98  CALCIUM 9.1     Intake/Output Summary (Last 24 hours) at 09/16/2021 1146 Last data filed at 09/16/2021 0827 Gross per 24 hour  Intake 720 ml  Output 1400 ml  Net -680 ml        Physical Exam: Vital Signs Blood pressure 113/68, pulse 80, temperature 97.9 F (36.6 C), temperature source Oral, resp. rate 16, height 5\' 11"  (1.803 m), weight 82.5 kg, SpO2 96 %. Gen: no distress, normal appearing HEENT: oral mucosa pink and moist, NCAT Cardio: Reg rate Chest: normal effort, normal rate of breathing Abd: soft, non-distended Ext: no edema Psych: pleasant, normal affect  Skin: scabs, dry skin. Surgical incisions CDI, foam dressings all in place Neuro:  Alert and oriented x 3. Normal insight and awareness. Intact Memory. Normal language and speech. Cranial nerve exam unremarkable. UE 5/5. LE 1/5 prox to 4/5 ADF/PF. No sensory deficits. No abnl tone Musculoskeletal: LE's limited by bracing, both LE's less tender with ROM   Assessment/Plan: 1. Functional deficits which require 3+ hours per day of interdisciplinary therapy in a comprehensive inpatient rehab setting. Physiatrist is providing close team supervision and 24 hour management of active medical problems listed below. Physiatrist and rehab team continue to assess barriers to discharge/monitor patient progress toward functional and medical  goals  Care Tool:  Bathing    Body parts bathed by patient: Right arm, Chest, Abdomen, Front perineal area, Face   Body parts bathed by helper: Left arm, Buttocks Body parts n/a: Right upper leg, Left upper leg, Right lower leg, Left lower leg   Bathing assist Assist Level: Moderate Assistance - Patient 50 - 74%     Upper Body Dressing/Undressing Upper body dressing   What is the patient wearing?: Pull over shirt    Upper body assist Assist Level: Minimal Assistance - Patient > 75%    Lower Body Dressing/Undressing Lower body dressing      What is the patient wearing?: Underwear/pull up, Pants     Lower body assist Assist for lower body dressing: Moderate Assistance - Patient 50 - 74%     Toileting Toileting Toileting Activity did not occur and hygiene only): N/A (no void or bm) (idependent with urinal)  Toileting assist Assist for toileting: Maximal Assistance - Patient 25 - 49% Assistive Device Comment: BSC via beasy board   Transfers Chair/bed transfer  Transfers assist  Chair/bed transfer activity did not occur: Safety/medical concerns  Chair/bed transfer assist level: Minimal Assistance - Patient > 75% (SB txfr)     Locomotion Ambulation   Ambulation assist  Ambulation activity did not occur: Safety/medical concerns          Walk 10 feet activity   Assist  Walk 10 feet activity did not occur: Safety/medical concerns        Walk 50 feet activity   Assist Walk 50 feet with 2 turns activity did not occur: Safety/medical concerns         Walk 150 feet activity   Assist Walk 150 feet activity did not occur: Safety/medical concerns         Walk 10 feet on uneven surface  activity   Assist Walk 10 feet on uneven surfaces activity did not occur: Safety/medical concerns         Wheelchair     Assist Is the patient using a wheelchair?: Yes      Wheelchair assist level: Total Assistance - Patient <  25% Max wheelchair distance: 150    Wheelchair 50 feet with 2 turns activity    Assist        Assist Level: Total Assistance - Patient < 25%   Wheelchair 150 feet activity     Assist      Assist Level: Total Assistance - Patient < 25%   Blood pressure 113/68, pulse 80, temperature 97.9 F (36.6 C), temperature source Oral, resp. rate 16, height 5\' 11"  (1.803 m), weight 82.5 kg, SpO2 96 %.  Medical Problem List and Plan: 1. 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  Impaired mobility: continue Lovenox--6 weeks total of AC recommended.              -antiplatelet therapy: N/A 3. Pain control: continue Tylenol qid, Gabapentin TID with robaxin prn. Add kpad             --oxycodone prn for severe pain              -- ice/heat prn for local  measures  -12/30 pain remains controlled at present 4. Mood: LCSW to follow for evaluation and support.              -antipsychotic agents: N/A 5. Neuropsych: This patient is capable of making decisions on his own behalf. 6. Skin/Wound Care: Routine pressure relief measures.  7. Fluids/Electrolytes/Nutrition: Monitor I/O.               --encouraging fluid intake, BUN stable to sl improved   -12/30 BUN/Cr improved. Albumin increasing 8. Pelvic ring fracture s/p SI nail: To be NWB BLE with bed to chair slide/lift transfers for 8 weeks. 9. R-Galeazzi fracture/dislocation s/p ORIF: NWB RUE--ok to WB thru elbow             --keep cast C/D 10 Right knee dislocation s/p MCL/posterior obique ligament repair: KI in place.    - PROM with therapy, CPM ordered by ortho 0-30 deg   -12/30 checked with Dr. 1/31 about advancing cpm  12/31: discussed with patient that he will likely get answer to this on Monday/Tuesday from  ortho 11. Left knee PCL/ACL derangement: Hinged brace for support and reconstruction in 4-6 weeks.   12. Metabolic bone disease: continue Vitamin D for supplement.  13. ABLA: hgb up  to 11.4 today, some concentration effect. 14. Abnormal LFTS: improving    -12/30 Likely reactive, AP up but this is due to ortho/bony trauma   -remainder of LFT's improved 15. Thrombocytopenia: resolved  16. Hyperglycemia: Stress induced v/s pre-diabetes.   hgb A1C 5.5  17. Hypoalbuminemia: improving. Continue supps     LOS: 9 days A FACE TO FACE EVALUATION WAS PERFORMED  Devin Jones 09/16/2021, 11:46 AM

## 2021-09-17 NOTE — Progress Notes (Addendum)
Physical Therapy TBI Note  Patient Details  Name: Devin Jones MRN: [id number] Date of Birth: 1966/01/04  Today's Date: 09/17/2021 PT Individual Time: 8453-6468 PT Individual Time Calculation (min): 35 min   Short Term Goals:  Week 2:  PT Short Term Goal 1 (Week 2): Pt will perform bed mobility without bed features and with supervision. PT Short Term Goal 2 (Week 2): Pt will perform bed to chair trasnsfer with minA.  Skilled Therapeutic Interventions/Progress Updates:  Pt resting in bed.  He denied pain.  Session focused on bed mobility and SBT.  Bil KIs already donned.  Rolling R with supervision in flat bed, no rails, using Yoga block stack for R elbow WBing during R side lying to sitting , with mod assist for bil LE mgt.  Pt placed SB under L hip, using yoga blocks again under R elbow, supervision.  Min assist for transfer slightly downhill, to L.  Transfer back to bed to R slightly uphill, with min assist to scoot backwards on bed as he was unsafely close to the edge.  SBT x 2 with improving board placement, scooting, and safety.  Sit> supine with mod assist for bil LEs, using gait belt around thighs for pt to assist in lifting them using LUE.  At end of session, pt resting in bed with needs at hand and bed alarm set.     Therapy Documentation Precautions:  Precautions Precautions: Fall Precaution Comments: NWB BLE 6-8 weeks, WB through R elbow only Required Braces or Orthoses: Knee Immobilizer - Right, Other Brace Other Brace: L bledsoe brace unlocked, R KI locked in extension Restrictions Weight Bearing Restrictions: Yes RUE Weight Bearing: Weight bear through elbow only RLE Weight Bearing: Non weight bearing LLE Weight Bearing: Non weight bearing Other Position/Activity Restrictions: NO weight bearing on L LE due ot pelvic fractures per ortho, cleared for R knee ROM on 12/22 focus on quad sets, L knee full ROM in bledsoe brace, cleared for full B hip ROM, cleared for R  shoulder ROM as tolerated, no R elbow extension with resistance    Agitated Behavior Scale: TBI Observation Details Observation Environment: CIR Start of observation period - Date: 09/17/21 Start of observation period - Time: 1610 End of observation period - Date: 09/17/21 End of observation period - Time: 1645 Agitated Behavior Scale (DO NOT LEAVE BLANKS) Short attention span, easy distractibility, inability to concentrate: Absent Impulsive, impatient, low tolerance for pain or frustration: Absent Uncooperative, resistant to care, demanding: Absent Violent and/or threatening violence toward people or property: Absent Explosive and/or unpredictable anger: Absent Rocking, rubbing, moaning, or other self-stimulating behavior: Absent Pulling at tubes, restraints, etc.: Absent Wandering from treatment areas: Absent Restlessness, pacing, excessive movement: Absent Repetitive behaviors, motor, and/or verbal: Absent Rapid, loud, or excessive talking: Absent Sudden changes of mood: Absent Easily initiated or excessive crying and/or laughter: Absent Self-abusiveness, physical and/or verbal: Absent Agitated behavior scale total score: 14        Therapy/Group: Individual Therapy  Meggan Dhaliwal 09/17/2021, 5:03 PM

## 2021-09-17 NOTE — Progress Notes (Signed)
Foam pelvis dressings changed with new mepilex applied as ordered. Pt tolerated well. Will continue to monitor.

## 2021-09-17 NOTE — Progress Notes (Signed)
Physical Therapy TBI Note  Patient Details  Name: Devin Jones MRN: [id number] Date of Birth: 1966-06-01  Today's Date: 09/17/2021 PT Individual Time: 1022-1105 PT Individual Time Calculation (min): 43 min   Short Term Goals: Week 2:  PT Short Term Goal 1 (Week 2): Pt will perform bed mobility without bed features and with supervision. PT Short Term Goal 2 (Week 2): Pt will perform bed to chair trasnsfer with minA.  Skilled Therapeutic Interventions/Progress Updates:     Patient in bed upon PT arrival. Patient alert and agreeable to PT session. Patient denied pain during session.  Focused session on d/c planning and expectations for home mobility along with transfer training and household w/c mobility and pressure relief.  Spent increased time discussing home set-up at his brother's house. Reinforced need for ramp placement for home entry with several steps to enter. Discussed benefits of using a personal bed versus hospital bed for positioning and transfers. Discussed options for use of wedge pillow and modifications for lowering bed height to ensure ease of slide board transfers from a regular bed, recommended bed height between 20-22 inches to be level, or near level, with his w/c. Patient would like to work toward not needing a hospital bed at d/c.   Therapeutic Activity: Bed Mobility: Patient performed supine to sit with supervision from a flat bed without use of bed rails. Patient used self-correction for NWB through R hand x1. Transfers: Patient performed a slide board transfer bed>w/c with min A and total A for board placement. Provided cues for hand placement, board placement, and head-hips relationship for proper technique and decreased assist with transfers. Use pillow case over the board to reduce friction during transfer.   Wheelchair Mobility:  Patient propelled wheelchair in the room focused on tight turns and transfer set-up in household setting with supervision.  Provided verbal cues for technique for backing up and parallel "parking."  Educated on need for pressure relief using lateral leans with B elbows on arm rest. Reported increased hip flexor irritation intially, ued for gluteal activation with reduced irritation. Educated on performing this pressure relief technique every 30 min 2x30 sec each side, patient in agreement. Educated on benefits of changing position throughout the day to avoid hip tightness and prolonged sitting pressure.   Patient in w/c requesting to sit up until lunch and to work on transfer at a later time due to fatigue at end of session with breaks locked and all needs within reach. Patient missed 17 min of skilled PT due to fatigue, RN made aware. Will attempt to make-up missed time as able.    Therapy Documentation Precautions:  Precautions Precautions: Fall Precaution Comments: NWB BLE 6-8 weeks, WB through R elbow only Required Braces or Orthoses: Knee Immobilizer - Right, Other Brace Other Brace: L bledsoe brace unlocked, R KI locked in extension Restrictions Weight Bearing Restrictions: Yes RUE Weight Bearing: Weight bear through elbow only RLE Weight Bearing: Non weight bearing LLE Weight Bearing: Non weight bearing Other Position/Activity Restrictions: NO weight bearing on L LE due ot pelvic fractures per ortho, cleared for R knee ROM on 12/22 focus on quad sets, L knee full ROM in bledsoe brace, cleared for full B hip ROM, cleared for R shoulder ROM as tolerated, no R elbow extension with resistance General: PT Amount of Missed Time (min): 17 Minutes PT Missed Treatment Reason: Patient fatigue Agitated Behavior Scale: TBI Observation Details Observation Environment: CIR Start of observation period - Date: 09/17/21 Start of observation period -  Time: 1022 End of observation period - Date: 09/17/21 End of observation period - Time: 1105 Agitated Behavior Scale (DO NOT LEAVE BLANKS) Short attention span, easy  distractibility, inability to concentrate: Absent Impulsive, impatient, low tolerance for pain or frustration: Absent Uncooperative, resistant to care, demanding: Absent Violent and/or threatening violence toward people or property: Absent Explosive and/or unpredictable anger: Absent Rocking, rubbing, moaning, or other self-stimulating behavior: Absent Pulling at tubes, restraints, etc.: Absent Wandering from treatment areas: Absent Restlessness, pacing, excessive movement: Absent Repetitive behaviors, motor, and/or verbal: Absent Rapid, loud, or excessive talking: Absent Sudden changes of mood: Absent Easily initiated or excessive crying and/or laughter: Absent Self-abusiveness, physical and/or verbal: Absent Agitated behavior scale total score: 14    Therapy/Group: Individual Therapy  Lysha Schrade L Keyari Kleeman PT, DPT  09/17/2021, 4:19 PM

## 2021-09-18 DIAGNOSIS — S062X1S Diffuse traumatic brain injury with loss of consciousness of 30 minutes or less, sequela: Secondary | ICD-10-CM

## 2021-09-18 LAB — BASIC METABOLIC PANEL
Anion gap: 10 (ref 5–15)
BUN: 17 mg/dL (ref 6–20)
CO2: 24 mmol/L (ref 22–32)
Calcium: 9.1 mg/dL (ref 8.9–10.3)
Chloride: 104 mmol/L (ref 98–111)
Creatinine, Ser: 0.83 mg/dL (ref 0.61–1.24)
GFR, Estimated: >60 mL/min (ref >60)
Glucose, Bld: 106 mg/dL — ABNORMAL HIGH (ref 70–99)
Potassium: 4.2 mmol/L (ref 3.5–5.1)
Sodium: 138 mmol/L (ref 135–145)

## 2021-09-18 NOTE — Progress Notes (Signed)
Occupational Therapy TBI Note ° °Patient Details  °Name: Devin Jones °MRN: [id number] °Date of Birth: 10/20/1965 ° °Today's Date: 09/18/2021 °OT Individual Time: 0950-1100 °OT Individual Time Calculation (min): 70 min  ° °Short Term Goals: °Week 2:  OT Short Term Goal 1 (Week 2): Patient will perform BSC transfer with mod A of 1 °OT Short Term Goal 2 (Week 2): Patient will thread pants using reacher with min A °OT Short Term Goal 3 (Week 2): Patient will come into long sitting in bed with min A of 1 in preparation for BADL tasks ° °Skilled Therapeutic Interventions/Progress Updates:  °  Pt greeted seated in wc and agreeable to OT treatment session. Pt reported he wanted to wash his hair today. OT had pt navigate wc to back up to the sink. Pt able to hold wash tray while OT assisted with hair washing task. Pt then finished UB bathing/dressing as well as grooming tasks at the sink with only set-up A from OT.Practiced urinal placement from wc which pt was able to demonstrate understanding, then practiced wc positining at the bed for slideboard transfer. Problem solved that leaving leg rests on would be the easiest. Addressed board placement options with pillow case placed on board to promote better slide. Pt needed 3 trials, but was eventually able to get board placed with min A. CGA slideboard transfer back to bed. Worked on pt coming into long sitting with yoga blocks used for R elbow support. Pt then was able to doff braces, wash legs and feet and even don socks with set-up A!!. Rolling used to wash buttocks and pull pants up over hips. Pt left semi-reclined in bed with bed alarm on, call bell in reach, and needs met.  ° °Therapy Documentation °Precautions:  °Precautions °Precautions: Fall °Precaution Comments: NWB BLE 6-8 weeks, WB through R elbow only °Required Braces or Orthoses: Knee Immobilizer - Right, Other Brace °Other Brace: L bledsoe brace unlocked, R KI locked in extension °Restrictions °Weight  Bearing Restrictions: Yes °RUE Weight Bearing: Weight bear through elbow only °RLE Weight Bearing: Non weight bearing °LLE Weight Bearing: Non weight bearing °Other Position/Activity Restrictions: NO weight bearing on L LE due ot pelvic fractures per ortho, cleared for R knee ROM on 12/22 focus on quad sets, L knee full ROM in bledsoe brace, cleared for full B hip ROM, cleared for R shoulder ROM as tolerated, no R elbow extension with resistance °Pain: °Pain Assessment °Pain Scale: 0-10 °Pain Score: 0-No pain °Agitated Behavior Scale: °TBI °Observation Details °Observation Environment: CIR °Start of observation period - Date: 09/18/21 °Start of observation period - Time: 0950 °End of observation period - Date: 09/18/21 °End of observation period - Time: 1100 °Agitated Behavior Scale (DO NOT LEAVE BLANKS) °Short attention span, easy distractibility, inability to concentrate: Absent °Impulsive, impatient, low tolerance for pain or frustration: Absent °Uncooperative, resistant to care, demanding: Absent °Violent and/or threatening violence toward people or property: Absent °Explosive and/or unpredictable anger: Absent °Rocking, rubbing, moaning, or other self-stimulating behavior: Absent °Pulling at tubes, restraints, etc.: Absent °Wandering from treatment areas: Absent °Restlessness, pacing, excessive movement: Absent °Repetitive behaviors, motor, and/or verbal: Absent °Rapid, loud, or excessive talking: Absent °Sudden changes of mood: Absent °Easily initiated or excessive crying and/or laughter: Absent °Self-abusiveness, physical and/or verbal: Absent °Agitated behavior scale total score: 14 ° ° °Therapy/Group: Individual Therapy ° °Elisabeth S Doe °09/18/2021, 12:42 PM °

## 2021-09-18 NOTE — Progress Notes (Signed)
Physical Therapy TBI Note  Patient Details  Name: Devin Jones MRN: [id number] Date of Birth: [birth year]  Today's Date: 09/18/2021 PT Individual Time: 0804-0859 PT Individual Time Calculation (min): 55 min   Short Term Goals: Week 2:  PT Short Term Goal 1 (Week 2): Pt will perform bed mobility without bed features and with supervision. PT Short Term Goal 2 (Week 2): Pt will perform bed to chair trasnsfer with minA.  Skilled Therapeutic Interventions/Progress Updates:     Pt received supine in bed and agrees to therapy. No complaint of pain. Pt performs supine to sit with HOB very slightly raised but without using bedrail. PT cues for positioning at EOB for safety. Pt then performs sliding board transfer from bed to [facility] to the L with set up assistance and cues for sequencing. Pt is able to place and remove board without assistance from PT, as well as manage armrests and brakes on WC.  PT has discussion with pt regarding DC disposition, going home versus going to SNF, and provides insight regarding barriers to DC home and potential strategies to amend barriers. Pt agreeable and says he will discuss with brother. Family ed scheduled for 1/3.  Pt self propels one-arm drive WC to gym independently. Pt transfers from [facility] to mat with set up and supervision assistance, and again does not require any additional physical assistance to manage WC or place sliding board.  PT discusses strategies for getting legs into bed and suggests use of momentum, as pt has bilateral hip flexor weakness. PT demonstrates transitional movement and then pt attempts. Pt is able to complete without physical assistance! Sit to supine with cues for sequencing. Pt performs ankle pumps, quad and glute sets, L sided heel slides, and bilateral SLRs AAROM, 1x10.  Supine to sit with supervision. Pt performs sliding board transfer back to [facility] with setup assistance and cues for body mechanics. Pt self propels WC back to room  independently. Left seated with all needs within reach.  Therapy Documentation Precautions:  Precautions Precautions: Fall Precaution Comments: NWB BLE 6-8 weeks, WB through R elbow only Required Braces or Orthoses: Knee Immobilizer - Right, Other Brace Other Brace: L bledsoe brace unlocked, R KI locked in extension Restrictions Weight Bearing Restrictions: Yes RUE Weight Bearing: Weight bear through elbow only RLE Weight Bearing: Non weight bearing LLE Weight Bearing: Non weight bearing Other Position/Activity Restrictions: NO weight bearing on L LE due ot pelvic fractures per ortho, cleared for R knee ROM on 12/22 focus on quad sets, L knee full ROM in bledsoe brace, cleared for full B hip ROM, cleared for R shoulder ROM as tolerated, no R elbow extension with resistance   Therapy/Group: Individual Therapy  Beau Fanny, PT, DPT 09/18/2021, 4:07 PM

## 2021-09-18 NOTE — Progress Notes (Signed)
Physical Therapy TBI Note  Patient Details  Name: Devin Jones MRN: [id number] Date of Birth: 1966-02-21  Today's Date: 09/18/2021 PT Individual Time: 1331-1431 PT Individual Time Calculation (min): 60 min   Short Term Goals: Week 2:  PT Short Term Goal 1 (Week 2): Pt will perform bed mobility without bed features and with supervision. PT Short Term Goal 2 (Week 2): Pt will perform bed to chair trasnsfer with minA.  Skilled Therapeutic Interventions/Progress Updates:  Patient supine in bed on entrance to room. Patient alert and agreeable to PT session.   Patient with no pain complaint throughout session.  Therapeutic Activity: Bed Mobility: Patient performed supine --> sit with supervision. Extra time required for push up to seated position. Transfers: Patient performed slide board transfers throughout session requiring ModA for board placement throughout but able to demonstrate slide with CGA/ supervision. Will need continued strengthening to hip flexors for improved ability to place board independently. Provided verbal cues for technique to place/ remove SB.  Wheelchair Mobility:  Patient propelled wheelchair from room to main therapy gym and back with distant supervision. Is able to maneuver w/c into place for transfers with supervision requiring vc. Does forget to move leg rests out of way prior to parking requiring vc and/ or MaxA to remove.  Therapeutic Exercise: Pt relates that he had trouble with SLR earlier with other PT and require assist. Recommended attempt at hip flexion using shorter lever arm with leg in knee bent position while seated. Once reaching seated position on EOB, pt guided in march with paired TA/ abdominal activation as pt initially performing trunk  forward and lateral flexion to L in order to lift LLE from bed surface. With added TA/ abdominal activation, pt keeps trunk still and is able to isolate movement at hip flexors. Performs x10. Then later at mat  table, pt performs seated abdominal crunches sitting back into theraball then to upright seated position 2x10 and hip flexion marches 2x10 with coordinated breathing and TA activation.   Patient supine  in bed at end of session with brakes locked, bed alarm set, and all needs within reach.     Therapy Documentation Precautions:  Precautions Precautions: Fall Precaution Comments: NWB BLE 6-8 weeks, WB through R elbow only Required Braces or Orthoses: Knee Immobilizer - Right, Other Brace Other Brace: L bledsoe brace unlocked, R KI locked in extension Restrictions Weight Bearing Restrictions: Yes RUE Weight Bearing: Weight bear through elbow only RLE Weight Bearing: Non weight bearing LLE Weight Bearing: Non weight bearing Other Position/Activity Restrictions: NO weight bearing on L LE due ot pelvic fractures per ortho, cleared for R knee ROM on 12/22 focus on quad sets, L knee full ROM in bledsoe brace, cleared for full B hip ROM, cleared for R shoulder ROM as tolerated, no R elbow extension with resistance General:   Vital Signs:   Pain: Pain Assessment Pain Scale: 0-10 Pain Score: 0-No pain Agitated Behavior Scale: TBI Observation Details Observation Environment: CIR Start of observation period - Date: 09/18/21 Start of observation period - Time: 1330 End of observation period - Date: 09/18/21 End of observation period - Time: 26712 Agitated Behavior Scale (DO NOT LEAVE BLANKS) Short attention span, easy distractibility, inability to concentrate: Absent Impulsive, impatient, low tolerance for pain or frustration: Absent Uncooperative, resistant to care, demanding: Absent Violent and/or threatening violence toward people or property: Absent Explosive and/or unpredictable anger: Absent Rocking, rubbing, moaning, or other self-stimulating behavior: Absent Pulling at tubes, restraints, etc.: Absent Wandering from  treatment areas: Absent Restlessness, pacing, excessive  movement: Absent Repetitive behaviors, motor, and/or verbal: Absent Rapid, loud, or excessive talking: Absent Sudden changes of mood: Absent Easily initiated or excessive crying and/or laughter: Absent Self-abusiveness, physical and/or verbal: Absent Agitated behavior scale total score: 14   Therapy/Group: Individual Therapy  Loel Dubonnet PT, DPT 09/18/2021, 1:03 PM
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
## 2021-09-19 NOTE — Progress Notes (Signed)
Sutures removed per order. Patient tolerated well.    Gladstone Lighter, LPN
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
## 2021-09-19 NOTE — Progress Notes (Signed)
PROGRESS NOTE  Per pt , ortho Dr August Saucer came to see pt and removed dressing to Right knee, per pt , ortho was fine with CPM up to 65 deg, no change in orders   ROS: Patient denies CP, SOB, N/V/D .    Objective:   No results found. No results for input(s): WBC, HGB, HCT, PLT in the last 72 hours.  Recent Labs    09/18/21 0516  NA 138  K 4.2  CL 104  CO2 24  GLUCOSE 106*  BUN 17  CREATININE 0.83  CALCIUM 9.1      Intake/Output Summary (Last 24 hours) at 09/19/2021 0744 Last data filed at 09/19/2021 0334 Gross per 24 hour  Intake --  Output 2400 ml  Net -2400 ml         Physical Exam: Vital Signs Blood pressure 119/80, pulse 78, temperature 98.4 F (36.9 C), temperature source Oral, resp. rate 17, height 5\' 11"  (1.803 m), weight 82.5 kg, SpO2 97 %.  General: No acute distress Mood and affect are appropriate Heart: Regular rate and rhythm no rubs murmurs or extra sounds Lungs: Clear to auscultation, breathing unlabored, no rales or wheezes Abdomen: Positive bowel sounds, soft nontender to palpation, nondistended Extremities: No clubbing, cyanosis, or edema   Skin: scabs, dry skin. Surgical incisions CDI, foam dressings all in place Neuro:  Alert and oriented x 3. Normal insight and awareness. Intact Memory. Normal language and speech. Cranial nerve exam unremarkable. UE 5/5. LE 1/5 prox to 4/5 ADF/PF. No sensory deficits. No abnl tone Musculoskeletal: LE's limited by bracing, both LE's less tender with ROM   Assessment/Plan: 1. Functional deficits which require 3+ hours per day of interdisciplinary therapy in a comprehensive inpatient rehab setting. Physiatrist is providing close team supervision and 24 hour management of active medical problems listed below. Physiatrist and rehab team continue to assess barriers to discharge/monitor patient progress toward functional and medical goals  Care Tool:  Bathing     Body parts bathed by patient: Right arm, Chest, Abdomen, Front perineal area, Face, Left arm, Buttocks, Left upper leg, Right lower leg, Left lower leg, Right upper leg   Body parts bathed by helper: Left arm, Buttocks Body parts n/a: Right upper leg, Left upper leg, Right lower leg, Left lower leg   Bathing assist Assist Level: Contact Guard/Touching assist     Upper Body Dressing/Undressing Upper body dressing   What is the patient wearing?: Pull over shirt    Upper body assist Assist Level: Independent    Lower Body Dressing/Undressing Lower body dressing      What is the patient wearing?: Underwear/pull up, Pants     Lower body assist Assist for lower body dressing: Contact Guard/Touching assist Assistive Device Comment: Reacher   Toileting Toileting Toileting Activity did not occur (Clothing management and hygiene only): N/A (no void or bm) (idependent with urinal)  Toileting assist Assist for toileting: Moderate Assistance - Patient 50 - 74% Assistive Device Comment: BSC via beasy board   Transfers Chair/bed transfer  Transfers assist  Chair/bed transfer activity did not occur: Safety/medical concerns  Chair/bed transfer assist level: Minimal Assistance - Patient > 75%  Locomotion Ambulation   Ambulation assist   Ambulation activity did not occur: Safety/medical concerns          Walk 10 feet activity   Assist  Walk 10 feet activity did not occur: Safety/medical concerns        Walk 50 feet activity   Assist Walk 50 feet with 2 turns activity did not occur: Safety/medical concerns         Walk 150 feet activity   Assist Walk 150 feet activity did not occur: Safety/medical concerns         Walk 10 feet on uneven surface  activity   Assist Walk 10 feet on uneven surfaces activity did not occur: Safety/medical concerns         Wheelchair     Assist Is the patient using a wheelchair?: Yes      Wheelchair assist  level: Total Assistance - Patient < 25% Max wheelchair distance: 150    Wheelchair 50 feet with 2 turns activity    Assist        Assist Level: Total Assistance - Patient < 25%   Wheelchair 150 feet activity     Assist      Assist Level: Total Assistance - Patient < 25%   Blood pressure 119/80, pulse 78, temperature 98.4 F (36.9 C), temperature source Oral, resp. rate 17, height 5\' 11"  (1.803 m), weight 82.5 kg, SpO2 97 %.  Medical Problem List and Plan: 1. Functional deficits secondary to mult trauma dn TBI and B/L pelvic fracture             -patient may not shower             -ELOS/Goals: 1/11- sup to mod I- for transfers  Continue CIR therapies including PT, OT   -seeking SNF placement but pt is uninsured 2.  Impaired mobility: continue Lovenox--6 weeks total of AC recommended.              -antiplatelet therapy: N/A 3. Pain control: continue Tylenol qid, Gabapentin TID with robaxin prn. Add kpad             --oxycodone prn for severe pain              -- ice/heat prn for local  measures  -12/30 pain remains controlled at present 4. Mood: LCSW to follow for evaluation and support.              -antipsychotic agents: N/A 5. Neuropsych: This patient is capable of making decisions on his own behalf. 6. Skin/Wound Care: Routine pressure relief measures.  7. Fluids/Electrolytes/Nutrition: Monitor I/O.               --encouraging fluid intake, BUN stable to sl improved   09/19/21- BMET nl  8. Pelvic ring fracture s/p SI nail: To be NWB BLE with bed to chair slide/lift transfers for 8 weeks. 9. R-Galeazzi fracture/dislocation s/p ORIF: NWB RUE--ok to WB thru elbow             --keep cast C/D 10 Right knee dislocation s/p MCL/posterior obique ligament repair: KI in place.    - PROM with therapy, CPM ordered by ortho 0-35 deg (per pt ortho stated may increase to 65 deg will confirm)    -Await recs from ortho regarding advancing CPM as well as potential to change KI to  bledsoe on right  11. Left knee PCL/ACL derangement: Hinged brace for support and reconstruction in 4-6 weeks.   12. Metabolic  bone disease: continue Vitamin D for supplement.  13. ABLA: hgb up to 11.4 today, some concentration effect. 14. Abnormal LFTS: improving    -12/30 Likely reactive, AP up but this is due to ortho/bony trauma   -remainder of LFT's improved 15. Thrombocytopenia: resolved  16. Hyperglycemia: Stress induced v/s pre-diabetes.   hgb A1C 5.5  17. Hypoalbuminemia: improving. Continue supps     LOS: 12 days A FACE TO FACE EVALUATION WAS PERFORMED  Devin Jones E Devin Jones 09/19/2021, 7:44 AM

## 2021-09-19 NOTE — Progress Notes (Signed)
Patient ID: Devin Jones, male   DOB: 1965-12-01, 56 y.o.   MRN: [id number]  SW briefly met with pt and pt brother Devin Jones to inform will follow-up after family education.   *SW met with pt, pt brother Devin Jones and friend Devin Jones to perform check in after family education. Devin Jones is comfortable with pt discharging to home. He continues to report that pt will be home by himself during the day. SW discussed charity HH and DME. SW explained unsure if the w/c that pt is currently using will be an option if unable to get loaner w/c through a specialty w/c vendor. Family will explore wide DABSC, and pt brother working on getting a bed for him. SW provided home measurement sheet.   SW will confirm there are no barriers to discharge.   Loralee Pacas, MSW, [place] Office: [phone] Cell: [phone] Fax: [phone]

## 2021-09-19 NOTE — Progress Notes (Signed)
Occupational Therapy TBI Note  Patient Details  Name: Devin Jones MRN: [id number] Date of Birth: 1966/01/31  Today's Date: 09/19/2021 Session 1 OT Individual Time: 4967-5916 OT Individual Time Calculation (min): 40 min   Session 2 OT Individual Time: 1300-1400 OT Individual Time Calculation (min): 60 min    Short Term Goals: Week 2:  OT Short Term Goal 1 (Week 2): Patient will perform BSC transfer with mod A of 1 OT Short Term Goal 2 (Week 2): Patient will thread pants using reacher with min A OT Short Term Goal 3 (Week 2): Patient will come into long sitting in bed with min A of 1 in preparation for BADL tasks  Skilled Therapeutic Interventions/Progress Updates:    Session 1 Pt greeted semi-reclined in bed and agreeable to OT treatment session. B LE braces off as ortho said he could have braces off in bed when resting. Worked on coming to long sitting from supine with supervision. Yoga blocks used to support R side at elbow while reaching to don socks with supervision, but min A to don L hinged knee brace and R knee immobilizer. Pt completed bed mobility with supervision. Pt able to place board and dysom onto [facility] with supervision and min cues. Slideboard transfer to the L using yoga blocks and CGA. Pt able to place yoga blocks for support with lateral leans as well as chair on L side to remove shorts. Pt with successful BM and voided bladder. Worked on problem solving for toileting having pt reach in the front to healp clean after BM. Pt donned pants using reacher, but needed min A from OT to get back over hips. Pt performed slidboard back to bed with CGA> PT left semi-reclined in bed with bed alarm on and needs met.   No pain  Agitated Behavior Scale: TBI Observation Details Observation Environment: CIR Start of observation period - Date: 09/19/21 Start of observation period - Time: 0735 End of observation period - Date: 09/19/21 End of observation period - Time:  0815 Agitated Behavior Scale (DO NOT LEAVE BLANKS) Short attention span, easy distractibility, inability to concentrate: Absent Impulsive, impatient, low tolerance for pain or frustration: Absent Uncooperative, resistant to care, demanding: Absent Violent and/or threatening violence toward people or property: Absent Explosive and/or unpredictable anger: Absent Rocking, rubbing, moaning, or other self-stimulating behavior: Absent Pulling at tubes, restraints, etc.: Absent Wandering from treatment areas: Absent Restlessness, pacing, excessive movement: Absent Repetitive behaviors, motor, and/or verbal: Absent Rapid, loud, or excessive talking: Absent Sudden changes of mood: Absent Easily initiated or excessive crying and/or laughter: Absent Self-abusiveness, physical and/or verbal: Absent Agitated behavior scale total score: 14  Session 2 Pt greeted semi-reclined in bed with brother present for family education. Educated on pt progress, OT goals, and home modifications. OT had pt demonstrate bed level BADLs including coming into long sitting to access lower body to adjust braces, don socks, and pants. Pt then transitioned to EOB with supervision and practiced slideboard transfer bed> drop arm BSC. Demonstrated lateral leans for clothing management and peri-care, then slideboard transfer to wc on L side using yoga blocks and close supervision. Extensive amount of time spent discussing home set-up and how pt could be more independent at home. Pt left seated in wc awaiting next therapy session.    Therapy Documentation Precautions:  Precautions Precautions: Fall Precaution Comments: NWB BLE 6-8 weeks, WB through R elbow only Required Braces or Orthoses: Knee Immobilizer - Right, Other Brace Other Brace: L bledsoe brace unlocked, R KI  locked in extension Restrictions Weight Bearing Restrictions: Yes RUE Weight Bearing: Weight bear through elbow only RLE Weight Bearing: Non weight  bearing LLE Weight Bearing: Non weight bearing Other Position/Activity Restrictions: NO weight bearing on L LE due ot pelvic fractures per ortho, cleared for R knee ROM on 12/22 focus on quad sets, L knee full ROM in bledsoe brace, cleared for full B hip ROM, cleared for R shoulder ROM as tolerated, no R elbow extension with resistance   Pain:  Denies pain Agitated Behavior Scale: TBI Observation Details Observation Environment: CIR Start of observation period - Date: 09/19/21 Start of observation period - Time: 1300 End of observation period - Date: 09/19/21 End of observation period - Time: 1400 Agitated Behavior Scale (DO NOT LEAVE BLANKS) Short attention span, easy distractibility, inability to concentrate: Absent Impulsive, impatient, low tolerance for pain or frustration: Absent Uncooperative, resistant to care, demanding: Absent Violent and/or threatening violence toward people or property: Absent Explosive and/or unpredictable anger: Absent Rocking, rubbing, moaning, or other self-stimulating behavior: Absent Pulling at tubes, restraints, etc.: Absent Wandering from treatment areas: Absent Restlessness, pacing, excessive movement: Absent Repetitive behaviors, motor, and/or verbal: Absent Rapid, loud, or excessive talking: Absent Sudden changes of mood: Absent Easily initiated or excessive crying and/or laughter: Absent Self-abusiveness, physical and/or verbal: Absent Agitated behavior scale total score: 14   Therapy/Group: Individual Therapy  Valma Cava 09/19/2021, 2:40 PM

## 2021-09-19 NOTE — Progress Notes (Signed)
Physical Therapy TBI Note  Patient Details  Name: Devin Jones MRN: [id number] Date of Birth: 07-Mar-1966  Today's Date: 09/19/2021 PT Individual Time: 0917-0959 and 1402-1500 PT Individual Time Calculation (min): 42 min and 58 min  Short Term Goals: Week 2:  PT Short Term Goal 1 (Week 2): Pt will perform bed mobility without bed features and with supervision. PT Short Term Goal 2 (Week 2): Pt will perform bed to chair trasnsfer with minA.  Skilled Therapeutic Interventions/Progress Updates:     1st Session: Pt received supine in bed and agrees to therapy. Supine to sit with verbal cues for sequencing and positioning. Pt performs sliding board transfer slightly uphill from bed to [facility], only requiring PT to provide cues for positioning and manually stabilizing WC due to wheels sliding on floor. Pt self propels WC to gym independently. Pt maneuvers WC with cues from PT on positioning, and then performs sliding board transfer to mat with not further assistance. PT provides cueing on sequencing and use of momentum for sit to supine and pt is able to complete without physical assistance. Pt performs supine there-ex for bilateral lower extremities for strength and ROM training, including 1x10: Ankle Pumps Quad Sets Glute sets LLE Heel slides and SLRs Supine to sit with cues for positioning at EOB for safety. Sliding board transfer from mat>WC>bed with cues for sequencing. Left supine with alarm intact and all needs within reach.  2nd Session: Pt received seated in [facility] and agrees to therapy. No complaint of pain. Pt's brother present for family education and PT provides explanation on pt's functional status and answers questions regarding home setup and functional needs. Pt propels WC independently to gym and perform sliding board transfer to mat table with PT providing stabilization of WC due to worn wheels with minimal friction. Pt transfers back to [facility] with same assistance. PT transport pt outside  via [facility] for time management. Pt performs car transfer into brother's [employer]aru Outback with PT providing assistance to place board and providing verbal cues on body mechanics and sequencing. Pt does not require physical assistance to complete. PT also provides brother with cueing on strategies for positioning to assist pt at discharge. Pt transported back to room. Transfer back to bed via sliding board and setup assistance. Sit to supine with cues on sequencing. Left with alarm intact and all needs within reach.   Therapy Documentation Precautions:  Precautions Precautions: Fall Precaution Comments: NWB BLE 6-8 weeks, WB through R elbow only Required Braces or Orthoses: Knee Immobilizer - Right, Other Brace Other Brace: L bledsoe brace unlocked, R KI locked in extension Restrictions Weight Bearing Restrictions: Yes RUE Weight Bearing: Weight bear through elbow only RLE Weight Bearing: Non weight bearing LLE Weight Bearing: Non weight bearing Other Position/Activity Restrictions: NO weight bearing on L LE due ot pelvic fractures per ortho, cleared for R knee ROM on 12/22 focus on quad sets, L knee full ROM in bledsoe brace, cleared for full B hip ROM, cleared for R shoulder ROM as tolerated, no R elbow extension with resistance Agitated Behavior Scale: TBI Observation Details Observation Environment: CIR Start of observation period - Date: 09/19/21 Start of observation period - Time: 1400 End of observation period - Date: 09/19/21 End of observation period - Time: 1500 Agitated Behavior Scale (DO NOT LEAVE BLANKS) Short attention span, easy distractibility, inability to concentrate: Absent Impulsive, impatient, low tolerance for pain or frustration: Absent Uncooperative, resistant to care, demanding: Absent Violent and/or threatening violence toward people or property:  Absent Explosive and/or unpredictable anger: Absent Rocking, rubbing, moaning, or other self-stimulating behavior:  Absent Pulling at tubes, restraints, etc.: Absent Wandering from treatment areas: Absent Restlessness, pacing, excessive movement: Absent Repetitive behaviors, motor, and/or verbal: Absent Rapid, loud, or excessive talking: Absent Sudden changes of mood: Absent Easily initiated or excessive crying and/or laughter: Absent Self-abusiveness, physical and/or verbal: Absent Agitated behavior scale total score: 14   Therapy/Group: Individual Therapy  Beau Fanny, PT, DPT 09/19/2021, 3:43 PM

## 2021-09-19 NOTE — Plan of Care (Signed)
Behavioral Plan   [place] Level: VIII  Behavior to decrease/ eliminate:   - Behaviorally appropriate - Aware of deficits   Changes to environment:   N/A  Interventions: - General safety awareness - Standard Fall Precautions - Safety belt in chair - Bed alarm engaged when in bed  Recommendations for interactions with patient: - Be polite - Empty urinal - All needs met  Attendees:    Dorthula Nettles, RN3 Elouise Munroe, PT-DPT Cherylynn Ridges, OTRL-CBIS

## 2021-09-19 NOTE — Progress Notes (Signed)
Nursing education done with patient and brother. Education on medications, pain medications. Incision care. Fall prevention and safety. Patient has no further questions.

## 2021-09-19 NOTE — Patient Care Conference (Signed)
Inpatient RehabilitationTeam Conference and Plan of Care Update Date: 09/19/2021   Time: 10:29 AM    Patient Name: Devin Jones      Medical Record Number: [id number]  Date of Birth: 1966-02-21 Sex: Male         Room/Bed: 4M03C/4M03C-01 Payor Info: Payor: MEDICAID PENDING / Plan: MEDICAID PENDING / Product Type: *No Product type* /    Admit Date/Time:  09/07/2021  3:59 PM  Primary Diagnosis:  TBI (traumatic brain injury)  Hospital Problems: Principal Problem:   TBI (traumatic brain injury) Active Problems:   Pelvic fracture [facility])   Multiple trauma    Expected Discharge Date: Expected Discharge Date: 09/27/21  Team Members Present: Physician leading conference: Dr. Genice Rouge Social Worker Present: Cecile Sheerer, LCSWA Nurse Present: Kennyth Arnold, RN PT Present: Malachi Pro, PT OT Present: Kearney Hard, OT PPS Coordinator present : Fae Pippin, SLP     Current Status/Progress Goal Weekly Team Focus  Bowel/Bladder   pt cont of bladder and bowel. last BM-01/02  remain cont of bladder  and bowel  assess q shift and PRN   Swallow/Nutrition/ Hydration             ADL's   CGA/supervision overall slideboard transfer, CGA bed level ADLs, Mod I UB ADLs  Supervision/min A, may upgrade  self-care retraning, pt/family educatin, dc planning, transfers   Mobility   supervision bed mobility, supervision to CGA sliding board transfers, independent WC mobility with one-arm drive WC  MinA, need to upgrade  DC prep, improved efficiency of transfers, core strengthening, BLE strengthening and NMR   Communication             Safety/Cognition/ Behavioral Observations            Pain   Pt denies pain.pain managment effective  pain>3  assess pain q shift and PRN   Skin   Multip. surgical incisions, healing.  no new break down and healing of incisions  assess q shift and PRN     Discharge Planning:  Pt is uninsured. Pt to d/c to home with his brother. Pt will need to be  intermittent supervision since brother works for a few hours during the week and his SIL and nephew also work as well. Pt approved for LOG for SNF placemnet if needed. No bed offers for SNF. Fam edu on 09/19/2020 1pm-4pm.   Team Discussion: Order to remove sutures from abdomen and hip placed. CPM at 65 degrees bilaterally. To increase CPM usage. Muscle relaxer improved sleep previous night. Continent B/B. Brother has physical limitations of his own. Unsure of family being able to assist due to work schedules. Contact guard/supervision with ADL's, board placement to [facility]. Making great progress. Can't guarantee a one-arm drive WC. Behavior plan to be completed and implemented today. Close to mod I. Family education today. Patient on target to meet rehab goals: yes, min assist goals, will upgrade due to making progress.  *See Care Plan and progress notes for long and short-term goals.   Revisions to Treatment Plan:  Adjusting medications and CPM usage.  Teaching Needs: Family education, medication management, skin/wound care, weight bearing precautions, safety awareness, transfer training, etc.  Current Barriers to Discharge: Decreased caregiver support, Home enviroment access/layout, Wound care, Lack of/limited family support, Weight, Weight bearing restrictions, and Medication compliance  Possible Resolutions to Barriers: Family education Behavior Plan in place Follow-up PT/OT     Medical Summary Current Status: NWB LE's and RUE below elbow - LBM yesterday-  no  pain 0/10- sutures out today; continent B/B  Barriers to Discharge: Behavior;Decreased family/caregiver support;Home enviroment access/layout;Weight;Weight bearing restrictions;Wound care  Barriers to Discharge Comments: whole family works and needs pt at [employer] I/intermittent supervision; family ed today- is S-CGA- shouldn't need physical assistance- PT_ close to mod I Possible Resolutions to [employer]: sutures out today-  behavior plan today since TBI- no behavior issues; increase CPM usage; will check with Ortho on RUE sutures, etc. d/c- 09/27/21   Continued Need for Acute Rehabilitation Level of Care: The patient requires daily medical management by a physician with specialized training in physical medicine and rehabilitation for the following reasons: Direction of a multidisciplinary physical rehabilitation program to maximize functional independence : Yes Medical management of patient stability for increased activity during participation in an intensive rehabilitation regime.: Yes Analysis of laboratory values and/or radiology reports with any subsequent need for medication adjustment and/or medical intervention. : Yes   I attest that I was present, lead the team conference, and concur with the assessment and plan of the team.   Tennis Must 09/19/2021, 2:26 PM

## 2021-09-20 LAB — CBC
HCT: 39.1 % (ref 39.0–52.0)
Hemoglobin: 12.8 g/dL — ABNORMAL LOW (ref 13.0–17.0)
MCH: 30.4 pg (ref 26.0–34.0)
MCHC: 32.7 g/dL (ref 30.0–36.0)
MCV: 92.9 fL (ref 80.0–100.0)
Platelets: 277 10*3/uL (ref 150–400)
RBC: 4.21 MIL/uL — ABNORMAL LOW (ref 4.22–5.81)
RDW: 15 % (ref 11.5–15.5)
WBC: 6.1 10*3/uL (ref 4.0–10.5)
nRBC: 0 % (ref 0.0–0.2)

## 2021-09-20 NOTE — Progress Notes (Addendum)
Patient ID: Devin Jones, male   DOB: 1965-11-10, 56 y.o.   MRN: [id number]  SW ordered TTB with [employer] via parachute (charity).  SW scheduled hospital follow-up appointment on Friday, January 27 @ 1:30pm with Bertram Denver, PA at [facility] [phone]). SW provided pt with appointment card.   Cecile Sheerer, MSW, LCSWA Office: [phone] Cell: [phone] Fax: [phone]

## 2021-09-20 NOTE — Progress Notes (Signed)
Physical Therapy TBI Note  Patient Details  Name: Devin Jones MRN: [id number] Date of Birth: 1966/05/14  Today's Date: 09/20/2021 PT Individual Time: [id number] PT Individual Time Calculation (min): 43 min   Short Term Goals: Week 2:  PT Short Term Goal 1 (Week 2): Pt will perform bed mobility without bed features and with supervision. PT Short Term Goal 2 (Week 2): Pt will perform bed to chair trasnsfer with minA.  Skilled Therapeutic Interventions/Progress Updates:    Pt received supine in bed and agreeable to therapy session. Pt currently not wearing either LE brace. Therapist reviewed with pt the Ortho MD note that states "he does not need a knee immobilizer" on RLE to which pt responded he thought an MD discussed the possibility of him getting transitioned to a different brace rather than just no brace at all - this therapist reached out to Dr. Dagoberto Ligas to receive clarification. Donned L LE Bledsoe brace as Ortho MD note does imply pt still needs to wear this when OOB.  Reached out to MD to clarify on the following:  - does pt need any bracing for R LE at all  - can pt now perform unrestricted ACTIVE ROM of R knee - does the L knee Bledsoe brace still need to be locked to prevent more than 60 degrees of flexion  Performed the following supine exercises:  - educated pt on performing B LE ankle DF/PF throughout the day, which pt reports he is doing - therapist also showed pt how to use gait belt to provide gentle additional stretch into ankle DF, educated to hold 1 minute 2x daily - L LE heel slides in Bledsoe brace x15 reps, limited to only 60 degrees of knee flexion in the brace - L LE hip abduction/adduction x15 reps  - R LE hip abduction/adduction x15reps  - gentle R LE active assisted heel slides x10 reps  - reports minimal tightness just medial to his patella but no true pain   Pt requesting to wear R knee immobilizer at this time for comfort during mobility tasks since he has  not been performing active movement at this knee up to this point. Therapist donned per pt request.   Supine>sitting R EOB with supervision and pt does excellent job maintaining precautions. L slide board transfer to w/c with pt able to place board without assist (1x min cuing to maintain L NWBing) and scoot with therapist stabilizing w/c for safety - pt uses 2 yoga blocks wrapped together to provide a support surface to push R elbow on in order to maintain that UE precaution. At end of session, pt left seated in w/c with needs in reach for OT arrival.   Therapy Documentation Precautions:  Precautions Precautions: Fall Precaution Comments: NWB BLE 6-8 weeks, WB through R elbow only Required Braces or Orthoses:  (R knee immobilizer discontinued by ortho MD on 1/3) Other Brace: L bledsoe brace unlocked and donned when OOB Restrictions Weight Bearing Restrictions: Yes RUE Weight Bearing: Weight bear through elbow only RLE Weight Bearing: Non weight bearing LLE Weight Bearing: Non weight bearing Other Position/Activity Restrictions: NO weight bearing on L LE due ot pelvic fractures per ortho, cleared for R knee ROM on 12/22 focus on quad sets and achieving 90 degrees of knee flexion, L knee full ROM in bledsoe brace, cleared for full B hip ROM, cleared for R shoulder ROM as tolerated, no R elbow extension with resistance   Pain: No complaints of pain and all exercises performed  within pt's tolerance.  Agitated Behavior Scale: TBI  Observation Details Observation Environment: pt's room Start of observation period - Date: 09/20/21 Start of observation period - Time: 0805 End of observation period - Date: 09/20/21 End of observation period - Time: 0848 Agitated Behavior Scale (DO NOT LEAVE BLANKS) Short attention span, easy distractibility, inability to concentrate: Absent Impulsive, impatient, low tolerance for pain or frustration: Absent Uncooperative, resistant to care, demanding:  Absent Violent and/or threatening violence toward people or property: Absent Explosive and/or unpredictable anger: Absent Rocking, rubbing, moaning, or other self-stimulating behavior: Absent Pulling at tubes, restraints, etc.: Absent Wandering from treatment areas: Absent Restlessness, pacing, excessive movement: Absent Repetitive behaviors, motor, and/or verbal: Absent Rapid, loud, or excessive talking: Absent Sudden changes of mood: Absent Easily initiated or excessive crying and/or laughter: Absent Self-abusiveness, physical and/or verbal: Absent Agitated behavior scale total score: 14     Therapy/Group: Individual Therapy  [name] , PT, DPT, NCS, CSRS  09/20/2021, 7:45 AM

## 2021-09-20 NOTE — Progress Notes (Signed)
Occupational Therapy TBI Note  Patient Details  Name: Devin Jones MRN: [id number] Date of Birth: [birth year]  Today's Date: 09/20/2021 OT Individual Time: [id number] OT Individual Time Calculation (min): 43 min    Short Term Goals: Week 2:  OT Short Term Goal 1 (Week 2): Patient will perform BSC transfer with mod A of 1 OT Short Term Goal 2 (Week 2): Patient will thread pants using reacher with min A OT Short Term Goal 3 (Week 2): Patient will come into long sitting in bed with min A of 1 in preparation for BADL tasks  Skilled Therapeutic Interventions/Progress Updates:    Pt resting in w/c upon arrival. RLE KI and LLE Bledsoe in place. OT intervention with focus on discharge planning and DME requirements/recommendations. Information for bariatric (wide) drop arm bsc and TTB provided for pt. Pt reports that therapy staff is investigating options for acquiring one arm drive w/c. Pt appreciative of information. Pt remained in w/c with all needs within reach.  Therapy Documentation Precautions:  Precautions Precautions: Fall Precaution Comments: NWB BLE 6-8 weeks, WB through R elbow only Required Braces or Orthoses:  (R knee immobilizer discontinued by ortho MD on 1/3) Other Brace: L bledsoe brace unlocked and donned when OOB Restrictions Weight Bearing Restrictions: Yes RUE Weight Bearing: Weight bear through elbow only RLE Weight Bearing: Non weight bearing LLE Weight Bearing: Non weight bearing Other Position/Activity Restrictions: NO weight bearing on L LE due ot pelvic fractures per ortho, cleared for R knee ROM on 12/22 focus on quad sets and achieving 90 degrees of knee flexion, L knee full ROM in bledsoe brace, cleared for full B hip ROM, cleared for R shoulder ROM as tolerated, no R elbow extension with resistance Pain: Pain Assessment Pain Scale: 0-10 Pain Score: 0-No pain Agitated Behavior Scale: TBI Observation Details Observation Environment: pt's room Start of  observation period - Date: 09/20/21 Start of observation period - Time: 0845 End of observation period - Date: 09/20/21 End of observation period - Time: 0930 Agitated Behavior Scale (DO NOT LEAVE BLANKS) Short attention span, easy distractibility, inability to concentrate: Absent Impulsive, impatient, low tolerance for pain or frustration: Absent Uncooperative, resistant to care, demanding: Absent Violent and/or threatening violence toward people or property: Absent Explosive and/or unpredictable anger: Absent Rocking, rubbing, moaning, or other self-stimulating behavior: Absent Pulling at tubes, restraints, etc.: Absent Wandering from treatment areas: Absent Restlessness, pacing, excessive movement: Absent Repetitive behaviors, motor, and/or verbal: Absent Rapid, loud, or excessive talking: Absent Sudden changes of mood: Absent Easily initiated or excessive crying and/or laughter: Absent Self-abusiveness, physical and/or verbal: Absent Agitated behavior scale total score: 14    Therapy/Group: Individual Therapy  Leroy Libman 09/20/2021, 9:52 AM

## 2021-09-20 NOTE — Progress Notes (Signed)
PROGRESS NOTE  Pt reports they did CPM for a total of 1 hour - 60 minutes in a row Last night- 0-60 degrees- didn't push to 65 degrees.  Got it late- and ortho tech who set him up said that only CPM 3x/day total 1 hour- don't see order in computer. Will verify with PA/ortho.   Also hasn't seen anyone for RUE splint.  Veryl Speak has BM daily; but not yet this AM- a little "slow" this AM.    ROS:   Pt denies SOB, abd pain, CP, N/V/C/D, and vision changes    Objective:   No results found. Recent Labs    09/20/21 0533  WBC 6.1  HGB 12.8*  HCT 39.1  PLT 277    Recent Labs    09/18/21 0516  NA 138  K 4.2  CL 104  CO2 24  GLUCOSE 106*  BUN 17  CREATININE 0.83  CALCIUM 9.1     Intake/Output Summary (Last 24 hours) at 09/20/2021 0829 Last data filed at 09/20/2021 0757 Gross per 24 hour  Intake 660 ml  Output 1700 ml  Net -1040 ml        Physical Exam: Vital Signs Blood pressure 111/74, pulse 77, temperature 97.9 F (36.6 C), temperature source Oral, resp. rate 16, height 5\' 11"  (1.803 m), weight 82.5 kg, SpO2 98 %.   General: awake, alert, appropriate, sitting up in bed; finished breakfast; NAD HENT: conjugate gaze; oropharynx moist CV: regular rate; no JVD Pulmonary: CTA B/L; no W/R/R- good air movement GI: soft, NT, ND, (+)BS Psychiatric: appropriate Neurological: Ox3 Skin: scabs, dry skin. Sutures out of L hip and lower abdomen incision- look good- no erythema or drainage;  foam dressings all in place; Splint on RUE/wrist.  Neuro:  Alert and oriented x 3. Normal insight and awareness. Intact Memory. Normal language and speech. Cranial nerve exam unremarkable. UE 5/5. LE 1/5 prox to 4/5 ADF/PF. No sensory deficits. No abnl tone Musculoskeletal: LE's limited by bracing, both LE's less tender with ROM   Assessment/Plan: 1. Functional deficits which require 3+ hours per day of interdisciplinary therapy in a  comprehensive inpatient rehab setting. Physiatrist is providing close team supervision and 24 hour management of active medical problems listed below. Physiatrist and rehab team continue to assess barriers to discharge/monitor patient progress toward functional and medical goals  Care Tool:  Bathing    Body parts bathed by patient: Right arm, Chest, Abdomen, Front perineal area, Face, Left arm, Buttocks, Left upper leg, Right lower leg, Left lower leg, Right upper leg   Body parts bathed by helper: Left arm, Buttocks Body parts n/a: Right upper leg, Left upper leg, Right lower leg, Left lower leg   Bathing assist Assist Level: Contact Guard/Touching assist     Upper Body Dressing/Undressing Upper body dressing   What is the patient wearing?: Pull over shirt    Upper body assist Assist Level: Independent    Lower Body Dressing/Undressing Lower body dressing      What is the patient wearing?: Underwear/pull up, Pants     Lower body assist Assist for lower body dressing: Contact Guard/Touching assist Assistive Device Comment:  Toileting Activity did not occur (Clothing management and hygiene only): N/A (no void or bm) (idependent with urinal)  Toileting assist Assist for toileting: Moderate Assistance - Patient 50 - 74% Assistive Device Comment: BSC via beasy board   Transfers Chair/bed transfer  Transfers assist  Chair/bed transfer activity did not occur: Safety/medical concerns  Chair/bed transfer assist level: Minimal Assistance - Patient > 75%     Locomotion Ambulation   Ambulation assist   Ambulation activity did not occur: Safety/medical concerns          Walk 10 feet activity   Assist  Walk 10 feet activity did not occur: Safety/medical concerns        Walk 50 feet activity   Assist Walk 50 feet with 2 turns activity did not occur: Safety/medical concerns         Walk 150 feet activity   Assist Walk 150  feet activity did not occur: Safety/medical concerns         Walk 10 feet on uneven surface  activity   Assist Walk 10 feet on uneven surfaces activity did not occur: Safety/medical concerns         Wheelchair     Assist Is the patient using a wheelchair?: Yes      Wheelchair assist level: Total Assistance - Patient < 25% Max wheelchair distance: 150    Wheelchair 50 feet with 2 turns activity    Assist        Assist Level: Total Assistance - Patient < 25%   Wheelchair 150 feet activity     Assist      Assist Level: Total Assistance - Patient < 25%   Blood pressure 111/74, pulse 77, temperature 97.9 F (36.6 C), temperature source Oral, resp. rate 16, height 5\' 11"  (1.803 m), weight 82.5 kg, SpO2 98 %.  Medical Problem List and Plan: 1. Functional deficits secondary to mult trauma dn TBI and B/L pelvic fracture             -patient may not shower             -ELOS/Goals: 1/11- sup to mod I- for transfers  Continue CIR therapies including PT, OT   -seeking SNF placement but pt is uninsured  1/4- Con't CIR_ PT and OT - is Supervision to CGA- will see if family can take home? 2.  Impaired mobility: continue Lovenox--6 weeks total of AC recommended.              -antiplatelet therapy: N/A 3. Pain control: continue Tylenol qid, Gabapentin TID with robaxin prn. Add kpad             --oxycodone prn for severe pain              -- ice/heat prn for local  measures  1/4- pain controlled- 0/10 pain- con't regimen 4. Mood: LCSW to follow for evaluation and support.              -antipsychotic agents: N/A 5. Neuropsych: This patient is capable of making decisions on his own behalf. 6. Skin/Wound Care: Routine pressure relief measures.  7. Fluids/Electrolytes/Nutrition: Monitor I/O.               --encouraging fluid intake, BUN stable to sl improved   09/19/21- BMET nl  8. Pelvic ring fracture s/p SI nail: To be NWB BLE with bed to chair slide/lift transfers  for 8 weeks. 9. R-Galeazzi fracture/dislocation s/p ORIF: NWB RUE--ok to WB thru elbow             --  keep cast C/D 10 Right knee dislocation s/p MCL/posterior obique ligament repair: KI in place.    - PROM with therapy, CPM ordered by ortho 0-35 deg (per pt ortho stated may increase to 65 deg will confirm)    -Await recs from ortho regarding advancing CPM as well as potential to change KI to bledsoe on right   1/4- no KI on RLE; CPM 0-65 degrees and increase by 5 degrees daily- goal 90 degrees by surgery in 2 weeks. Will call Ortho about RUE and  ROM of LLE Bledsoe 11. Left knee PCL/ACL derangement: Hinged brace for support and reconstruction in 4-6 weeks.   12. Metabolic bone disease: continue Vitamin D for supplement.  13. ABLA: hgb up to 11.4 today, some concentration effect. 14. Abnormal LFTS: improving    -12/30 Likely reactive, AP up but this is due to ortho/bony trauma   -remainder of LFT's improved 15. Thrombocytopenia: resolved  16. Hyperglycemia: Stress induced v/s pre-diabetes.   hgb A1C 5.5  17. Hypoalbuminemia: improving. Continue supps    I spent a total of 35 minutes on total care today- >50% coordination of care speaking with PT as well as PA about care and d/w PA calling Ortho and plan.   LOS: 13 days A FACE TO FACE EVALUATION WAS PERFORMED  Almadelia Looman 09/20/2021, 8:29 AM

## 2021-09-20 NOTE — Progress Notes (Signed)
Occupational Therapy Session Note  Patient Details  Name: Devin Jones MRN: [id number] Date of Birth: 1966/06/12  Today's Date: 09/20/2021 OT Individual Time: 7169-6789 OT Individual Time Calculation (min): 60 min    Short Term Goals: Week 2:  OT Short Term Goal 1 (Week 2): Patient will perform BSC transfer with mod A of 1 OT Short Term Goal 2 (Week 2): Patient will thread pants using reacher with min A OT Short Term Goal 3 (Week 2): Patient will come into long sitting in bed with min A of 1 in preparation for BADL tasks  Skilled Therapeutic Interventions/Progress Updates:    Pt received supine with no c/o pain. Pt agreeable to OT session focused on ADLs at shower level. Reviewed both ortho MD note and chat with MD/PT to determine need for B KI's in shower. Determined that with 60 degrees of allowed ROM pt would not break this range while seated on a lower TTB. He transferred from bed to w/c with (S) using the slideboard and placing the board himself. He was taken to the tub room where he transferred to the TTB with min A for lifting BLE. He required min A to remove clothing d/t not having reacher present. He completed bathing with min A for washing feet and R bottom. Extensive time spent problem solving through bathing and transfers at home. Pt completed LB dressing seated on the TTB with min A overall to pull up posteriorly, mod cueing for technique. He returned to the w/c with min A. He returned to the bed and was left supine with all needs met, bed alarm set.   Therapy Documentation Precautions:  Precautions Precautions: Fall Precaution Comments: NWB BLE 6-8 weeks, WB through R elbow only Required Braces or Orthoses: Knee Immobilizer - Right, Other Brace Other Brace: L bledsoe brace unlocked, R KI locked in extension Restrictions Weight Bearing Restrictions: Yes RUE Weight Bearing: Weight bear through elbow only RLE Weight Bearing: Non weight bearing LLE Weight Bearing: Non  weight bearing Other Position/Activity Restrictions: NO weight bearing on L LE due ot pelvic fractures per ortho, cleared for R knee ROM on 12/22 focus on quad sets, L knee full ROM in bledsoe brace, cleared for full B hip ROM, cleared for R shoulder ROM as tolerated, no R elbow extension with resistance  Therapy/Group: Individual Therapy  Curtis Sites 09/20/2021, 6:20 AM

## 2021-09-20 NOTE — Progress Notes (Signed)
Occupational Therapy Session Note  Patient Details  Name: Devin Jones MRN: [id number] Date of Birth: 11/11/1965  Today's Date: 09/20/2021 OT Individual Time: 6789-3810 OT Individual Time Calculation (min): 57 min    Short Term Goals: Week 1:  OT Short Term Goal 1 (Week 1): Patient will complete BSC transfer with Max A of 2 OT Short Term Goal 1 - Progress (Week 1): Met OT Short Term Goal 2 (Week 1): Pt will tolerate being OOB for 30 minutes OT Short Term Goal 2 - Progress (Week 1): Met OT Short Term Goal 3 (Week 1): Patient will demonstrate use of LH reacher to help thread pants OT Short Term Goal 3 - Progress (Week 1): Met Week 2:  OT Short Term Goal 1 (Week 2): Patient will perform BSC transfer with mod A of 1 OT Short Term Goal 2 (Week 2): Patient will thread pants using reacher with min A OT Short Term Goal 3 (Week 2): Patient will come into long sitting in bed with min A of 1 in preparation for BADL tasks   Skilled Therapeutic Interventions/Progress Updates:    Pt greeted at time of session sitting up in wheelchair agreeable to OT session, no pain throughout session. Pt having several questions at beginning of session regarding bracing and ROM limitations, reviewing with the pt messages from group message today and relayed to pt that per ortho pt does not need to wear KI but pt keeping on for comfort and unclear restrictions at this time. Discussion with pt at beginning of session regarding CLOF, family ed that was completed, techniques for simplifying ADLs/toileting at home. Pt self propelling room > gym and set self up for slide board transfer. OT assisting with adjusting board under more thigh and pt able to perform slide board > L side to mat with Min A. Sitting EOM, focused on sitting balance and core strength for 2x10 lateral leans on to elbows only to adhere to WB precautions. 1x5 sit ups on small wedge with Mod A to achieve. Pt also laying in supine for hip flexor stretch per  request. Slide board mat > wheelchair toward R side with CGA/Min and transported back to room. Slide board wheelchair > bed with Min A toward L side and sit > supine Supervision with extended time to sequence. Alarm on call bell in reach.   Therapy Documentation Precautions:  Precautions Precautions: Fall Precaution Comments: NWB BLE 6-8 weeks, WB through R elbow only Required Braces or Orthoses: Knee Immobilizer - Right, Other Brace Other Brace: L bledsoe brace unlocked, R KI locked in extension Restrictions Weight Bearing Restrictions: Yes RUE Weight Bearing: Weight bear through elbow only RLE Weight Bearing: Non weight bearing LLE Weight Bearing: Non weight bearing Other Position/Activity Restrictions: NO weight bearing on L LE due ot pelvic fractures per ortho, cleared for R knee ROM on 12/22 focus on quad sets, L knee full ROM in bledsoe brace, cleared for full B hip ROM, cleared for R shoulder ROM as tolerated, no R elbow extension with resistance     Therapy/Group: Individual Therapy  Viona Gilmore 09/20/2021, 7:29 AM

## 2021-09-21 NOTE — Progress Notes (Signed)
Occupational Therapy TBI Note  Patient Details  Name: Devin Jones MRN: [id number] Date of Birth: 04-10-1966  Today's Date: 09/21/2021 OT Individual Time: 8338-2505 OT Individual Time Calculation (min): 72 min    Short Term Goals: Week 2:  OT Short Term Goal 1 (Week 2): Patient will perform BSC transfer with mod A of 1 OT Short Term Goal 2 (Week 2): Patient will thread pants using reacher with min A OT Short Term Goal 3 (Week 2): Patient will come into long sitting in bed with min A of 1 in preparation for BADL tasks  Skilled Therapeutic Interventions/Progress Updates:  Pt greeted supine in bed agreeable to OT intervention. Session focus on functional mobility, w/c propulsion in community setting, dynamic sitting balance, core strength and decreasing overall caregiver burden. Pt required total A to don KI ( pt prefers to wear during therapy even though per pt he no longer has to wear KI) on RLE  and total A to don hinge brace on LLE. pt completed bed mobility MOD I with use of bed features. SB transfer from EOB>w/c going towards L side with Supervision, MIN A to stabilize w/c. Pt completed w/c propulsion to gym MOD I. Additonal SB transfer from w/c>EOB with supervision. Pt completed below therapeutic activities to facilitate improved core strength to promote independence with transfers.  Pt completed 2x10 reps of modified crunches leaning back on therapy ball.  Pt completed 2x1 min of russian twists    Lateral leans to R and L side for oblique strength and endurance.    Remainder of session focus on w/c mobility in community setting with pt complete w/c propulsion from gym to atrium. Worked on navigating around obstacles and across various terrains as well as Community education officer. Briefly discussed navigating restaurants and other community settings from w/c level with pt verbalizing understanding.pt completed w/c propulsion back to room where pt completed additional SB transfer from w/c>EOB  with supervision.  Pt able to elevate BLEs back to bed with supervision. Pt left supine in bed with bed alarm activated and all needs within reach.                    Therapy Documentation Precautions:  Precautions Precautions: Fall Precaution Comments: NWB BLE 6-8 weeks, WB through R elbow only Required Braces or Orthoses:  (R knee immobilizer discontinued by ortho MD on 1/3) Other Brace: L bledsoe brace unlocked and donned when OOB Restrictions Weight Bearing Restrictions: Yes RUE Weight Bearing: Weight bear through elbow only RLE Weight Bearing: Non weight bearing LLE Weight Bearing: Non weight bearing Other Position/Activity Restrictions: NO weight bearing on L LE due ot pelvic fractures per ortho, cleared for R knee ROM on 12/22 focus on quad sets and achieving 90 degrees of knee flexion, L knee full ROM in bledsoe brace, cleared for full B hip ROM, cleared for R shoulder ROM as tolerated, no R elbow extension with resistance    Pain: No pain reported during session  Agitated Behavior Scale: TBI Observation Details Observation Environment: CIR Start of observation period - Date: 09/21/21 Start of observation period - Time: 0901 End of observation period - Date: 09/21/21 End of observation period - Time: 1013 Agitated Behavior Scale (DO NOT LEAVE BLANKS) Short attention span, easy distractibility, inability to concentrate: Absent Impulsive, impatient, low tolerance for pain or frustration: Absent Uncooperative, resistant to care, demanding: Absent Violent and/or threatening violence toward people or property: Absent Explosive and/or unpredictable anger: Absent Rocking, rubbing, moaning, or other self-stimulating  behavior: Absent Pulling at tubes, restraints, etc.: Absent Wandering from treatment areas: Absent Restlessness, pacing, excessive movement: Absent Repetitive behaviors, motor, and/or verbal: Absent Rapid, loud, or excessive talking: Absent Sudden changes of mood:  Absent Easily initiated or excessive crying and/or laughter: Absent Self-abusiveness, physical and/or verbal: Absent Agitated behavior scale total score: 14     Therapy/Group: Individual Therapy  Pollyann Glen [facility] 09/21/2021, 10:16 AM

## 2021-09-21 NOTE — Progress Notes (Signed)
Physical Therapy Weekly Progress Note  Patient Details  Name: Devin Jones MRN: [id number] Date of Birth: 10-20-1965  Beginning of progress report period: September 15, 2021 End of progress report period: September 21, 2020  Today's Date: 09/21/2021 PT Individual Time: 1300-1400 PT Individual Time Calculation (min): 60 min   Patient has met 2 of 2 short term goals.  Pt is progressing well toward mobility goals, improving independence with bed mobility, balance, and transfers. Pt is performing all at supervision to CGA level, primarily to ensure safety by stabilizing WC. Pt has had family education with brother and performed car transfer into brother's subaru to prepare for DC home.  Patient continues to demonstrate the following deficits muscle weakness, decreased cardiorespiratoy endurance, and decreased sitting balance and decreased balance strategies and therefore will continue to benefit from skilled PT intervention to increase functional independence with mobility.  Patient progressing toward long term goals..  Continue plan of care.  PT Short Term Goals Week 2:  PT Short Term Goal 1 (Week 2): Pt will perform bed mobility without bed features and with supervision. PT Short Term Goal 1 - Progress (Week 2): Met PT Short Term Goal 2 (Week 2): Pt will perform bed to chair trasnsfer with minA. PT Short Term Goal 2 - Progress (Week 2): Met Week 3:  PT Short Term Goal 1 (Week 3): STGs = LTGs  Skilled Therapeutic Interventions/Progress Updates:     Pt received seated in WC and agrees to therapy. No complaint of pain. Pt self propels WC x150' to gym with one arm drive WC. Pt is able to maneuver WC and perform sliding board transfer to mat with cues for positioning and sequencing.   Sit to supine with cues for sequencing. Pt performs supine there-ex for strengthening and increasing ROM. PT messaged PA Devin Jones to verify that pt is clear for AROM on R leg without KI in place. Devin Jones came by gym to  clarify and assured pt that AROM as tolerated is "ok". Pt performs following exercises 1x15: Quad Sets Glute Sets Heel Slides Hip abduction SLRs SAQs PT provides verbal and tactile cues for correct performance and to provide NM feedback primarily through quad and glute med for specific exercises. Supine to sit with cues for safe positioning. Pt performs sliding board transfer back to Devin Jones and then bed with setup assistance. Sit to supine with cues for sequencing. Left with alarm intact and all needs within reach.   Therapy Documentation Precautions:  Precautions Precautions: Fall Precaution Comments: NWB BLE 6-8 weeks, WB through R elbow only Required Braces or Orthoses:  (R knee immobilizer discontinued by ortho MD on 1/3) Other Brace: L bledsoe brace unlocked and donned when OOB Restrictions Weight Bearing Restrictions: Yes RUE Weight Bearing: Weight bear through elbow only RLE Weight Bearing: Non weight bearing LLE Weight Bearing: Non weight bearing Other Position/Activity Restrictions: NO weight bearing on L LE due ot pelvic fractures per ortho, cleared for R knee ROM on 12/22 focus on quad sets and achieving 90 degrees of knee flexion, L knee full ROM in bledsoe brace, cleared for full B hip ROM, cleared for R shoulder ROM as tolerated, no R elbow extension with resistance   Therapy/Group: Individual Therapy  Breck Coons, PT, DPT 09/21/2021, 4:02 PM

## 2021-09-21 NOTE — Progress Notes (Signed)
Patient ID: Devin Jones, male   DOB: 18-Sep-1965, 56 y.o.   MRN: [id number]  SW received updates from therapy pt will be able to get loaner w/c. SW ordered slide board from [employer] (charity).   Cecile Sheerer, MSW, LCSWA Office: [phone] Cell: [phone] Fax: [phone]

## 2021-09-21 NOTE — Progress Notes (Signed)
PROGRESS NOTE  No new complaints. Has worries about moving RLE without KI in place, especially with bed transfers or anything which puts M-L stress on knee. Has tolerated CPM. No pain  ROS: Patient denies fever, rash, sore throat, blurred vision, nausea, vomiting, diarrhea, cough, shortness of breath or chest pain,   headache, or mood change.     Objective:   No results found. Recent Labs    09/20/21 0533  WBC 6.1  HGB 12.8*  HCT 39.1  PLT 277    No results for input(s): NA, K, CL, CO2, GLUCOSE, BUN, CREATININE, CALCIUM in the last 72 hours.    Intake/Output Summary (Last 24 hours) at 09/21/2021 0912 Last data filed at 09/21/2021 0735 Gross per 24 hour  Intake 360 ml  Output 950 ml  Net -590 ml        Physical Exam: Vital Signs Blood pressure 119/84, pulse 76, temperature 97.7 F (36.5 C), temperature source Oral, resp. rate 16, height 5\' 11"  (1.803 m), weight 82.5 kg, SpO2 98 %.   Constitutional: No distress . Vital signs reviewed. HEENT: NCAT, EOMI, oral membranes moist Neck: supple Cardiovascular: RRR without murmur. No JVD    Respiratory/Chest: CTA Bilaterally without wheezes or rales. Normal effort    GI/Abdomen: BS +, non-tender, non-distended Ext: no clubbing, cyanosis, or edema Psych: pleasant and cooperative  Skin: all sutures out, wounds CDI.  Splint on RUE/wrist.  Neuro:  Alert and oriented x 3. Normal insight and awareness. Intact Memory. Normal language and speech. Cranial nerve exam unremarkable. UE 5/5. LE 1/5 prox to 4/5 ADF/PF. No sensory deficits. No abnl tone Musculoskeletal: LE's still tender with PROM to an extent   Assessment/Plan: 1. Functional deficits which require 3+ hours per day of interdisciplinary therapy in a comprehensive inpatient rehab setting. Physiatrist is providing close team supervision and 24 hour management of active medical problems listed below. Physiatrist and rehab  team continue to assess barriers to discharge/monitor patient progress toward functional and medical goals  Care Tool:  Bathing    Body parts bathed by patient: Right arm, Chest, Abdomen, Front perineal area, Face, Left arm, Buttocks, Left upper leg, Right lower leg, Left lower leg, Right upper leg   Body parts bathed by helper: Left arm, Buttocks Body parts n/a: Right upper leg, Left upper leg, Right lower leg, Left lower leg   Bathing assist Assist Level: Contact Guard/Touching assist     Upper Body Dressing/Undressing Upper body dressing   What is the patient wearing?: Pull over shirt    Upper body assist Assist Level: Independent    Lower Body Dressing/Undressing Lower body dressing      What is the patient wearing?: Underwear/pull up, Pants     Lower body assist Assist for lower body dressing: Contact Guard/Touching assist Assistive Device Comment: Reacher   Toileting Toileting Toileting Activity did not occur (Clothing management and hygiene only): N/A (no void or bm) (idependent with urinal)  Toileting assist Assist for toileting: Moderate Assistance - Patient 50 - 74% Assistive Device Comment: BSC via beasy board   Transfers Chair/bed transfer  Transfers assist  Chair/bed transfer activity did not occur: Safety/medical concerns  Chair/bed transfer assist  level: Supervision/Verbal cueing Chair/bed transfer assistive device: Sliding board   Locomotion Ambulation   Ambulation assist   Ambulation activity did not occur: Safety/medical concerns          Walk 10 feet activity   Assist  Walk 10 feet activity did not occur: Safety/medical concerns        Walk 50 feet activity   Assist Walk 50 feet with 2 turns activity did not occur: Safety/medical concerns         Walk 150 feet activity   Assist Walk 150 feet activity did not occur: Safety/medical concerns         Walk 10 feet on uneven surface  activity   Assist Walk 10 feet on  uneven surfaces activity did not occur: Safety/medical concerns         Wheelchair     Assist Is the patient using a wheelchair?: Yes      Wheelchair assist level: Total Assistance - Patient < 25% Max wheelchair distance: 150    Wheelchair 50 feet with 2 turns activity    Assist        Assist Level: Total Assistance - Patient < 25%   Wheelchair 150 feet activity     Assist      Assist Level: Total Assistance - Patient < 25%   Blood pressure 119/84, pulse 76, temperature 97.7 F (36.5 C), temperature source Oral, resp. rate 16, height 5\' 11"  (1.803 m), weight 82.5 kg, SpO2 98 %.  Medical Problem List and Plan: 1. Functional deficits secondary to mult trauma dn TBI and B/L pelvic fracture             -patient may not shower             -ELOS/Goals: 1/11- sup to mod I- for transfers  Continue CIR therapies including PT, OT   -pt now is going home 2.  Impaired mobility: continue Lovenox--6 weeks total of AC recommended.              -antiplatelet therapy: N/A 3. Pain control: continue Tylenol qid, Gabapentin TID with robaxin prn. Add kpad             --oxycodone prn for severe pain              -- ice/heat prn for local  measures  1/5- pain controlled  4. Mood: LCSW to follow for evaluation and support.              -antipsychotic agents: N/A 5. Neuropsych: This patient is capable of making decisions on his own behalf. 6. Skin/Wound Care: Routine pressure relief measures.  7. Fluids/Electrolytes/Nutrition: Monitor I/O.               --encouraging fluid intake, BUN stable to sl improved   09/19/21- BMET nl  8. Pelvic ring fracture s/p SI nail: To be NWB BLE with bed to chair slide/lift transfers for 8 weeks. 9. R-Galeazzi fracture/dislocation s/p ORIF: NWB RUE--ok to WB thru elbow             --f/u with ortho re: plan 10 Right knee dislocation s/p MCL/posterior obique ligament repair: KI in place.    -1/5 no KI on RLE; CPM 0-65 degrees and increase by 5  degrees daily- goal 90 degrees in 2 weeks prior to ACL/PCL reconstruction -?advance ROM left bledsoe 11. Left knee PCL/ACL derangement: Hinged brace for support and ?reconstruction in 4-6 weeks.   12. Metabolic bone disease: continue Vitamin D for  supplement.  13. ABLA: hgb up to 11.4 today, some concentration effect. 14. Abnormal LFTS: improving    -12/30 Likely reactive, AP up but this is due to ortho/bony trauma   -remainder of LFT's improved 15. Thrombocytopenia: resolved  16. Hyperglycemia: Stress induced v/s pre-diabetes.   hgb A1C 5.5  17. Hypoalbuminemia: improving. Continue supps       LOS: 14 days A FACE TO FACE EVALUATION WAS PERFORMED  Ranelle Oyster 09/21/2021, 9:12 AM

## 2021-09-21 NOTE — Progress Notes (Signed)
Occupational Therapy Weekly Progress Note  Patient Details  Name: Devin Jones MRN: [id number] Date of Birth: [birth year]  Beginning of progress report period: September 08, 2021 End of progress report period: September 21, 2021  Today's Date: 09/21/2021 OT Individual Time: 1050-1200 OT Individual Time Calculation (min): 70 min    Patient has met 3 of 3 short term goals.  Pt is making steady progress towards OT goals. Pt has has been working hard with therapy on functional slideboard transfers from bec>wc>BSC and tub bench. Pt is at an overall supervision level except CGA for slideboard out of shower. We are working on becoming mod I for transfers and BADL tasks. Pt is almost at mod I level, but still needs set-up occasionally. Continue current POC.   Patient continues to demonstrate the following deficits: muscle weakness and decreased balance strategies and difficulty maintaining precautions and therefore will continue to benefit from skilled OT intervention to enhance overall performance with BADL.  Patient progressing toward long term goals..  Continue plan of care.  OT Short Term Goals Week 2:  OT Short Term Goal 1 (Week 2): Patient will perform BSC transfer with mod A of 1 OT Short Term Goal 1 - Progress (Week 2): Met OT Short Term Goal 2 (Week 2): Patient will thread pants using reacher with min A OT Short Term Goal 2 - Progress (Week 2): Met OT Short Term Goal 3 (Week 2): Patient will come into long sitting in bed with min A of 1 in preparation for BADL tasks OT Short Term Goal 3 - Progress (Week 2): Met Week 3:  OT Short Term Goal 1 (Week 3): LTG=STG 2/2 ELOS  Skilled Therapeutic Interventions/Progress Updates:    Pt greeted seated on BSC. Pt with successful BM and voided bladder. Pt able to complete peri-care using lateral leans. Pt then able to pull pants up using chair on the left to lean far enough to pull up pants. Pt then able to place slideboard and Dysom, then  supervision to slide over to drop arm wc. Pt able to maneuver wc with one-armed drive mod I. Pt went to the apartment and discussed slideboard transfers to different surfaces. Pt completed slideboard transfer to low couch. Pt needed mod A to transfer back uphill to wc due to low surface. Discussed making sure surface does not sink down to ensure safety with transfer. Pt propelled wc to the kitchen and education on simple meal prep and kitchen access from wc level. Practiced obtaining items from lower cabinets, filling pot with water, and using counter surface to transfer items. Pt peopelled wc in hallway while discussing dc plan further with patient. Pt left seated in wc at end of session with call bell in reach and needs met.   Therapy Documentation Precautions:  Precautions Precautions: Fall Precaution Comments: NWB BLE 6-8 weeks, WB through R elbow only Required Braces or Orthoses:  (R knee immobilizer discontinued by ortho MD on 1/3) Other Brace: L bledsoe brace unlocked and donned when OOB Restrictions Weight Bearing Restrictions: Yes RUE Weight Bearing: Weight bear through elbow only RLE Weight Bearing: Non weight bearing LLE Weight Bearing: Non weight bearing Other Position/Activity Restrictions: NO weight bearing on L LE due ot pelvic fractures per ortho, cleared for R knee ROM on 12/22 focus on quad sets and achieving 90 degrees of knee flexion, L knee full ROM in bledsoe brace, cleared for full B hip ROM, cleared for R shoulder ROM as tolerated, no R elbow extension with resistance  Pain:  No pain reported    09/21/21 1200  Observation Details  Observation Environment CIR  Start of observation period - Date 09/21/21  Start of observation period - Time 1050  End of observation period - Date 09/21/21  End of observation period - Time 1200  Agitated Behavior Scale (DO NOT LEAVE BLANKS)  Short attention span, easy distractibility, inability to concentrate 1  Impulsive, impatient, low  tolerance for pain or frustration 1  Uncooperative, resistant to care, demanding 1  Violent and/or threatening violence toward people or property 1  Explosive and/or unpredictable anger 1  Rocking, rubbing, moaning, or other self-stimulating behavior 1  Pulling at tubes, restraints, etc. 1  Wandering from treatment areas 1  Restlessness, pacing, excessive movement 1  Repetitive behaviors, motor, and/or verbal 1  Rapid, loud, or excessive talking 1  Sudden changes of mood 1  Easily initiated or excessive crying and/or laughter 1  Self-abusiveness, physical and/or verbal 1  Agitated behavior scale total score 14     Therapy/Group: Individual Therapy  Valma Cava 09/21/2021, 1:04 PM

## 2021-09-22 NOTE — Progress Notes (Signed)
°  Subjective: Patient is a 56 year old male who is s/p right knee MCL/medial capsular repair/reconstruction on 08/31/2021.  He is in CPM machine in bed and feels the right knee is doing well.  He is still nonweightbearing from his pelvic surgery for about 4 more weeks according to him.  He has been working on improving his knee range of motion for upcoming surgery on the right knee.  Denies any fevers, chills, or night sweats, drainage from his incision.  No chest pain or shortness of breath or calf pain.  Objective: Vital signs in last 24 hours: Temp:  [98 F (36.7 C)-98.3 F (36.8 C)] 98.2 F (36.8 C) (01/06 0541) Pulse Rate:  [72-90] 72 (01/06 0541) Resp:  [17-18] 18 (01/06 0541) BP: (110-117)/(71-74) 110/74 (01/06 0541) SpO2:  [96 %-100 %] 99 % (01/06 0541) Weight:  [82.6 kg] 82.6 kg (01/05 1616)  Intake/Output from previous day: 01/05 0701 - 01/06 0700 In: 1560 [P.O.:1560] Out: 1600 [Urine:1600] Intake/Output this shift: No intake/output data recorded.  Exam:  Ortho exam demonstrates palpable DP pulse of both lower extremities.  No calf tenderness bilaterally.  Negative Homans' sign bilaterally.  Incision is healing well on the medial aspect of the right knee.  Range of motion from 0 to approximately 95 degrees on the right knee today.  Left knee has excellent range of motion from 0 to greater than 90 degrees.  There is continued laxity to varus stress in the left knee.  Valgus stressing on the right knee has a solid endpoint with no significant laxity at 0 or 30 degrees.  Lachman exam negative on the left knee but positive on the right knee.  Solid endpoint in the left knee on Lachman.  Labs: Recent Labs    09/20/21 0533  HGB 12.8*   Recent Labs    09/20/21 0533  WBC 6.1  RBC 4.21*  HCT 39.1  PLT 277   No results for input(s): NA, K, CL, CO2, BUN, CREATININE, GLUCOSE, CALCIUM in the last 72 hours. No results for input(s): LABPT, INR in the last 72  hours.  Assessment/Plan: Plan at this time is right knee ACL reconstruction with PCL reconstruction along with left knee posterior lateral corner reconstruction at the same time on 09/28/2021 around midday.  He states that he is supposed to be discharged from rehab on 1/11 to his brother's house and he states he would like to just stay in the hospital for his procedure and then go home afterward.  I think this is reasonable and will reach out to Dr. Riley Kill on his thoughts of this plan.  He is currently on Lovenox so this will have to be stopped on 09/26/2020 in anticipation of procedure on the 12th.  His range of motion is improved compared with last exam by Dr. August Saucer so he appears to be ready for surgery at this time.   Devin Jones 09/22/2021, 12:41 PM

## 2021-09-22 NOTE — Progress Notes (Addendum)
PROGRESS NOTE  No new issues today. OK with right KE being off now. Tells me that ortho mentioned surgery for right knee on 1/12.   ROS: Patient denies fever, rash, sore throat, blurred vision, nausea, vomiting, diarrhea, cough, shortness of breath or chest pain, joint or back pain, headache, or mood change.     Objective:   No results found. Recent Labs    09/20/21 0533  WBC 6.1  HGB 12.8*  HCT 39.1  PLT 277    No results for input(s): NA, K, CL, CO2, GLUCOSE, BUN, CREATININE, CALCIUM in the last 72 hours.    Intake/Output Summary (Last 24 hours) at 09/22/2021 0920 Last data filed at 09/22/2021 0700 Gross per 24 hour  Intake 1200 ml  Output 1250 ml  Net -50 ml        Physical Exam: Vital Signs Blood pressure 110/74, pulse 72, temperature 98.2 F (36.8 C), resp. rate 18, height 5\' 11"  (1.803 m), weight 82.6 kg, SpO2 99 %.   Constitutional: No distress . Vital signs reviewed. HEENT: NCAT, EOMI, oral membranes moist Neck: supple Cardiovascular: RRR without murmur. No JVD    Respiratory/Chest: CTA Bilaterally without wheezes or rales. Normal effort    GI/Abdomen: BS +, non-tender, non-distended Ext: no clubbing, cyanosis, or edema Psych: pleasant and cooperative  Neuro:  Alert and oriented x 3. Normal insight and awareness. Intact Memory. Normal language and speech. Cranial nerve exam unremarkable. UE 5/5. LE 1/5 prox to 4/5 ADF/PF. No sensory deficits. No abnl tone Musculoskeletal: LE's still tender with PROM to an extent   Assessment/Plan: 1. Functional deficits which require 3+ hours per day of interdisciplinary therapy in a comprehensive inpatient rehab setting. Physiatrist is providing close team supervision and 24 hour management of active medical problems listed below. Physiatrist and rehab team continue to assess barriers to discharge/monitor patient progress toward functional and medical goals  Care  Tool:  Bathing    Body parts bathed by patient: Right arm, Chest, Abdomen, Front perineal area, Face, Left arm, Buttocks, Left upper leg, Right lower leg, Left lower leg, Right upper leg   Body parts bathed by helper: Left arm, Buttocks Body parts n/a: Right upper leg, Left upper leg, Right lower leg, Left lower leg   Bathing assist Assist Level: Contact Guard/Touching assist     Upper Body Dressing/Undressing Upper body dressing   What is the patient wearing?: Pull over shirt    Upper body assist Assist Level: Independent    Lower Body Dressing/Undressing Lower body dressing      What is the patient wearing?: Underwear/pull up, Pants     Lower body assist Assist for lower body dressing: Contact Guard/Touching assist Assistive Device Comment: Reacher   Toileting Toileting Toileting Activity did not occur (Clothing management and hygiene only): N/A (no void or bm) (idependent with urinal)  Toileting assist Assist for toileting: Moderate Assistance - Patient 50 - 74% Assistive Device Comment: BSC via beasy board   Transfers Chair/bed transfer  Transfers assist  Chair/bed transfer activity did not occur: Safety/medical concerns  Chair/bed transfer assist level: Supervision/Verbal cueing Chair/bed transfer assistive device: Sliding board   Locomotion Ambulation   Ambulation assist  Ambulation activity did not occur: Safety/medical concerns          Walk 10 feet activity   Assist  Walk 10 feet activity did not occur: Safety/medical concerns        Walk 50 feet activity   Assist Walk 50 feet with 2 turns activity did not occur: Safety/medical concerns         Walk 150 feet activity   Assist Walk 150 feet activity did not occur: Safety/medical concerns         Walk 10 feet on uneven surface  activity   Assist Walk 10 feet on uneven surfaces activity did not occur: Safety/medical concerns         Wheelchair     Assist Is the  patient using a wheelchair?: Yes      Wheelchair assist level: Total Assistance - Patient < 25% Max wheelchair distance: 150    Wheelchair 50 feet with 2 turns activity    Assist        Assist Level: Total Assistance - Patient < 25%   Wheelchair 150 feet activity     Assist      Assist Level: Total Assistance - Patient < 25%   Blood pressure 110/74, pulse 72, temperature 98.2 F (36.8 C), resp. rate 18, height 5\' 11"  (1.803 m), weight 82.6 kg, SpO2 99 %.  Medical Problem List and Plan: 1. Functional deficits secondary to mult trauma dn TBI and B/L pelvic fracture             -patient may not shower             -ELOS/Goals: 1/11- sup to mod I- for transfers  -Continue CIR therapies including PT, OT   -due to his RUE fx and NWB BLE, he would benefit from a left-sided one-arm-drive wheelchair  -surgery now planned 1/12? Will f/u with ortho 2.  Impaired mobility: continue Lovenox--6 weeks total of AC recommended.              -antiplatelet therapy: N/A 3. Pain control: continue Tylenol qid, Gabapentin TID with robaxin prn. Add kpad             --oxycodone prn for severe pain              -- ice/heat prn for local  measures  1/6- pain controlled  4. Mood: LCSW to follow for evaluation and support.              -antipsychotic agents: N/A 5. Neuropsych: This patient is capable of making decisions on his own behalf. 6. Skin/Wound Care: Routine pressure relief measures.  7. Fluids/Electrolytes/Nutrition: Monitor I/O.               --encouraging fluid intake, BUN stable to sl improved   09/19/21- BMET nl  8. Pelvic ring fracture s/p SI nail: To be NWB BLE with bed to chair slide/lift transfers for 8 weeks. 9. R-Galeazzi fracture/dislocation s/p ORIF: NWB RUE--ok to WB thru elbow             --f/u with ortho re: plan 10 Right knee dislocation s/p MCL/posterior obique ligament repair: KI in place.    -1/5 no KI on RLE; CPM 0-65 degrees and increase by 5 degrees daily- goal  90 degrees prior to ACL/PCL reconstruction now scheduled for 1/12? 11. Left knee PCL/ACL derangement: Hinged brace for support and ?reconstruction in 4-6 weeks.    -bledsoe when oob, unlocked 12. Metabolic bone disease: continue Vitamin D for  supplement.  13. ABLA: hgb up to 11.4 today, some concentration effect. 14. Abnormal LFTS: improving    - Likely reactive, AP elevated due to ortho/bony trauma   -remainder of LFT's improved 15. Thrombocytopenia: resolved  16. Hyperglycemia: Stress induced v/s pre-diabetes.   hgb A1C 5.5  17. Hypoalbuminemia: improving. Continue supps       LOS: 15 days A FACE TO FACE EVALUATION WAS PERFORMED  Ranelle Oyster 09/22/2021, 9:20 AM

## 2021-09-22 NOTE — Progress Notes (Signed)
Physical Therapy Session Note  Patient Details  Name: Devin Jones MRN: [id number] Date of Birth: 1966/06/02  Today's Date: 09/22/2021 PT Individual Time: 1101-1205 PT Individual Time Calculation (min): 64 min   Short Term Goals: Week 1:  PT Short Term Goal 1 (Week 1): Pt will tolerate sitting in WC >2 hourrs between therapies PT Short Term Goal 1 - Progress (Week 1): Met PT Short Term Goal 2 (Week 1): Pt will transfer to [facility] with mod assist and LRAD PT Short Term Goal 2 - Progress (Week 1): Met PT Short Term Goal 3 (Week 1): Pt will  initiate WC mobility with one arm drive WC. PT Short Term Goal 3 - Progress (Week 1): Met Week 2:  PT Short Term Goals - Week 2 PT Short Term Goal 1 (Week 2): Pt will perform bed mobility without bed features and with supervision. PT Short Term Goal 1 - Progress (Week 2): Met PT Short Term Goal 2 (Week 2): Pt will perform bed to chair trasnsfer with minA. PT Short Term Goal 2 - Progress (Week 2): Met PT Short Term Goals - Week 3 PT Short Term Goal 1 (Week 3): STGs = LTGs  Skilled Therapeutic Interventions/Progress Updates:  Patient supine in bed on entrance to room. Patient alert and agreeable to PT session. Relates disc with surgical PA who has mentioned surgical date of Thursday 1/12 with Dr. Marlou Sa, yet no one seems to know re: this when pt asks. In looking in pt's chart, no notes found, but appointment found for surgical appointment scheduled 1/12. After session, this appointment brought to SW's attention in order to ensure that pt's d/c is coordinated so that pt stays in hospital and just gets transferred to surgical unit instead of discharging home the day before surgery. SW relates info to medical treatment team.   Patient with no pain complaint throughout session.  Therapeutic Activity: Bed Mobility: Patient performed supine <> sit with supervision. Less momentum build required this session as pt demos increased trunk/ abdominal strength to bring  BLE to bed surface. No vc required to perform. Transfers: Patient performed slide board transfers throughout session with overall supervision/ CGA. Some MinA req'd to either hold w/c from moving or hold SB from moving. No vc req'd for technique or performance.   Wheelchair Mobility:  Patient propelled wheelchair from room to main therapy gym and back with supervision. No vc req'd. Pt maintains leg rests in position for transfers.   Therapeutic Exercise: Per Dr. Randel Pigg note, pt needs to easily reach 90 deg of knee flexion for RLE prior to surgery. R knee measured to 82 degrees. L knee measured to 93 degrees.   Patient performed the following exercises with vc/ tc for proper technique. - RLE supine AROM heel slides x30 - BLE supine SLR AROM 2x10 - BLE seated AROM marches 2x10 - BLE seated AROM LAQs 2x10  Patient supine  in bed at end of session with brakes locked, bed alarm set, and all needs within reach.     Disc with social worker re: pt's chart and scheduled surgery for Thu 1/12 for ACL and PCL reconstruction with Dr. Marlou Sa.   Therapy Documentation Precautions:  Precautions Precautions: Fall Precaution Comments: NWB BLE 6-8 weeks, WB through R elbow only Required Braces or Orthoses:  (R knee immobilizer discontinued by ortho MD on 1/3) Other Brace: L bledsoe brace unlocked and donned when OOB Restrictions Weight Bearing Restrictions: Yes RUE Weight Bearing: Weight bear through elbow only RLE Weight Bearing:  Non weight bearing LLE Weight Bearing: Non weight bearing Other Position/Activity Restrictions: NO weight bearing on L LE due ot pelvic fractures per ortho, cleared for R knee ROM on 12/22 focus on quad sets and achieving 90 degrees of knee flexion, L knee full ROM in bledsoe brace, cleared for full B hip ROM, cleared for R shoulder ROM as tolerated, no R elbow extension with resistance General:   Pain:  No pain complaint this session.   Agitated Behavior  Scale: TBI Observation Details Observation Environment: CIR Start of observation period - Date: 09/22/21 Start of observation period - Time: 1100 End of observation period - Date: 09/22/21 End of observation period - Time: 1200 Agitated Behavior Scale (DO NOT LEAVE BLANKS) Short attention span, easy distractibility, inability to concentrate: Absent Impulsive, impatient, low tolerance for pain or frustration: Absent Uncooperative, resistant to care, demanding: Absent Violent and/or threatening violence toward people or property: Absent Explosive and/or unpredictable anger: Absent Rocking, rubbing, moaning, or other self-stimulating behavior: Absent Pulling at tubes, restraints, etc.: Absent Wandering from treatment areas: Absent Restlessness, pacing, excessive movement: Absent Repetitive behaviors, motor, and/or verbal: Absent Rapid, loud, or excessive talking: Absent Sudden changes of mood: Absent Easily initiated or excessive crying and/or laughter: Absent Self-abusiveness, physical and/or verbal: Absent Agitated behavior scale total score: 14  Therapy/Group: Individual Therapy  Alger Simons PT, DPT 09/22/2021, 12:57 PM

## 2021-09-22 NOTE — Progress Notes (Signed)
Patient ID: Devin Jones, male   DOB: August 28, 1966, 56 y.o.   MRN: [id number]  SW received updates from [facility] that one arm [employer] w/c will deliver loaner w/c and pt is able to keep until Medicaid is approved.   SW received updates from medical team about possible surgery on 1/12. Medical team intends to confirm d/c plan.  *Attending confirms pt will d/c from CIR on 1/12 to ortho. Medical team aware.   Cecile Sheerer, MSW, LCSWA Office: [phone] Cell: [phone] Fax: [phone]

## 2021-09-22 NOTE — Progress Notes (Signed)
Physical Therapy TBI Note  Patient Details  Name: Devin Jones MRN: [id number] Date of Birth: February 21, 1966  Today's Date: 09/22/2021 PT Individual Time: 1520-1629 PT Individual Time Calculation (min): 69 min   Short Term Goals: Week 3:  PT Short Term Goal 1 (Week 3): STGs = LTGs  Skilled Therapeutic Interventions/Progress Updates:     Pt received supine in bed and agrees to therapy. No complaint of pain. Pt performs bed mobility without bed features and with cues for positioning at EOB. Pt uses sliding board to transfer from bed to [facility] with PT providing setup assistance and stabilizing WC to prevent wheel from sliding across floor for safety. Pt self propels one arm drive WC to gym independently. Sliding board transfer to mat table with setup assistance and use of yoga blocks for L elbow WB. Pt performs 2x10 quad sets and glute sets.  Pt rolls to prone with cues minA and cues for engagement of core. In prone pt performs 1x10 glute sets. Pt then performs hamstring curls in prone, 2x15. Pt then performs midrange knee flexion/extension oscillations with small amplitude and high frequency to work on NM control, 1x30 seconds with each. Pt then tasked to perform to fatigue and completes 3:55 with L leg. With R leg pt unable to perform with as high a frequency or as small  an amplitude, but performs for 5:21 seconds.  Pt rolls to supine with minA and cues for body mechanics. Pt completes 1x15 SLRs with R and L leg with 15 second hold on final rep. Supine to sit with cues for positioning at edge of mat. Pt performs sliding board transfer from mat>WC>bed with setup assistance. Left supine with alarm intact and all needs within reach.  Therapy Documentation Precautions:  Precautions Precautions: Fall Precaution Comments: NWB BLE 6-8 weeks, WB through R elbow only Required Braces or Orthoses:  (R knee immobilizer discontinued by ortho MD on 1/3) Other Brace: L bledsoe brace unlocked and donned when  OOB Restrictions Weight Bearing Restrictions: Yes RUE Weight Bearing: Weight bear through elbow only RLE Weight Bearing: Non weight bearing LLE Weight Bearing: Non weight bearing Other Position/Activity Restrictions: NO weight bearing on L LE due ot pelvic fractures per ortho, cleared for R knee ROM on 12/22 focus on quad sets and achieving 90 degrees of knee flexion, L knee full ROM in bledsoe brace, cleared for full B hip ROM, cleared for R shoulder ROM as tolerated, no R elbow extension with resistance Agitated Behavior Scale: TBI Observation Details Observation Environment: CIR Start of observation period - Date: 09/22/21 Start of observation period - Time: 1515 End of observation period - Date: 09/22/21 End of observation period - Time: 1630 Agitated Behavior Scale (DO NOT LEAVE BLANKS) Short attention span, easy distractibility, inability to concentrate: Absent Impulsive, impatient, low tolerance for pain or frustration: Absent Uncooperative, resistant to care, demanding: Absent Violent and/or threatening violence toward people or property: Absent Explosive and/or unpredictable anger: Absent Rocking, rubbing, moaning, or other self-stimulating behavior: Absent Pulling at tubes, restraints, etc.: Absent Wandering from treatment areas: Absent Restlessness, pacing, excessive movement: Absent Repetitive behaviors, motor, and/or verbal: Absent Rapid, loud, or excessive talking: Absent Sudden changes of mood: Absent Easily initiated or excessive crying and/or laughter: Absent Self-abusiveness, physical and/or verbal: Absent Agitated behavior scale total score: 14    Therapy/Group: Individual Therapy  Beau Fanny, PT, DPT 09/22/2021, 4:55 PM

## 2021-09-22 NOTE — Progress Notes (Signed)
Occupational Therapy TBI Note  Patient Details  Name: Devin Jones MRN: [id number] Date of Birth: 04/07/1966  Today's Date: 09/22/2021 OT Individual Time: 0850-1000 OT Individual Time Calculation (min): 70 min   Short Term Goals: Week 3:  OT Short Term Goal 1 (Week 3): LTG=STG 2/2 ELOS  Skilled Therapeutic Interventions/Progress Updates:     Pt greeted semi-reclined in bed and agreeable to OT treatment session. Pt wanted to wash off today. He declined to shower. Bed level lower body bathing/dressing completed from long sitting using yoga blocks under R elbow for support, but overall set-up A only. Pt able to almost circle sit to don pants and socks without leg braces on. Discussed pro's and con's of wearing  brace outside of long pants. Pt wanted to try brace outside of pants today. Pt oriented and donned brace with set-up. Pt then needed min cues for rolling instead of bridging (which puts weight through legs). Pt transferred to sitting EOB mod I. UB bathing/dressing seated EOB set-up A. Slideboard transfer from bed to wc with pt able to place slidebaord and transfer with set-up only. Pt propelled wc to the sink using 1 armed drive mod I. Grooming tasks at the sink mod I. Pt propelled one-arm drive wc to therapy gym. R hand there-ex with focus on isolated  PIP and DIP movements as well as thumb adduction. OT then educated on R shoulder the-ex using table to work on shoulder ROM. Pt propelled wc back to room and pt left seated in wc with alarm belt on, call bell in reach, and needs met.   Therapy Documentation Precautions:  Precautions Precautions: Fall Precaution Comments: NWB BLE 6-8 weeks, WB through R elbow only Required Braces or Orthoses:  (R knee immobilizer discontinued by ortho MD on 1/3) Other Brace: L bledsoe brace unlocked and donned when OOB Restrictions Weight Bearing Restrictions: Yes RUE Weight Bearing: Weight bear through elbow only RLE Weight Bearing: Non weight  bearing LLE Weight Bearing: Non weight bearing Other Position/Activity Restrictions: NO weight bearing on L LE due ot pelvic fractures per ortho, cleared for R knee ROM on 12/22 focus on quad sets and achieving 90 degrees of knee flexion, L knee full ROM in bledsoe brace, cleared for full B hip ROM, cleared for R shoulder ROM as tolerated, no R elbow extension with resistance Pain: Pain Assessment Pain Scale: 0-10 Pain Score: 0-No pain Agitated Behavior Scale: TBI Observation Details Observation Environment: CIR Start of observation period - Date: 09/22/21 Start of observation period - Time: 0850 End of observation period - Date: 09/22/21 End of observation period - Time: 1000 Agitated Behavior Scale (DO NOT LEAVE BLANKS) Short attention span, easy distractibility, inability to concentrate: Absent Impulsive, impatient, low tolerance for pain or frustration: Absent Uncooperative, resistant to care, demanding: Absent Violent and/or threatening violence toward people or property: Absent Explosive and/or unpredictable anger: Absent Rocking, rubbing, moaning, or other self-stimulating behavior: Absent Pulling at tubes, restraints, etc.: Absent Wandering from treatment areas: Absent Restlessness, pacing, excessive movement: Absent Repetitive behaviors, motor, and/or verbal: Absent Rapid, loud, or excessive talking: Absent Sudden changes of mood: Absent Easily initiated or excessive crying and/or laughter: Absent Self-abusiveness, physical and/or verbal: Absent Agitated behavior scale total score: 14   Therapy/Group: Individual Therapy  Valma Cava 09/22/2021, 10:04 AM

## 2021-09-23 LAB — BASIC METABOLIC PANEL
Anion gap: 11 (ref 5–15)
BUN: 18 mg/dL (ref 6–20)
CO2: 23 mmol/L (ref 22–32)
Calcium: 8.9 mg/dL (ref 8.9–10.3)
Chloride: 104 mmol/L (ref 98–111)
Creatinine, Ser: 0.86 mg/dL (ref 0.61–1.24)
GFR, Estimated: >60 mL/min (ref >60)
Glucose, Bld: 136 mg/dL — ABNORMAL HIGH (ref 70–99)
Potassium: 3.8 mmol/L (ref 3.5–5.1)
Sodium: 138 mmol/L (ref 135–145)

## 2021-09-23 LAB — CBC
HCT: 38.9 % — ABNORMAL LOW (ref 39.0–52.0)
Hemoglobin: 12.4 g/dL — ABNORMAL LOW (ref 13.0–17.0)
MCH: 29.9 pg (ref 26.0–34.0)
MCHC: 31.9 g/dL (ref 30.0–36.0)
MCV: 93.7 fL (ref 80.0–100.0)
Platelets: 240 10*3/uL (ref 150–400)
RBC: 4.15 MIL/uL — ABNORMAL LOW (ref 4.22–5.81)
RDW: 14.6 % (ref 11.5–15.5)
WBC: 6.4 10*3/uL (ref 4.0–10.5)
nRBC: 0 % (ref 0.0–0.2)

## 2021-09-23 MED ORDER — POVIDONE-IODINE 7.5 % EX SOLN
Freq: Once | CUTANEOUS | Status: DC
  Filled 2021-09-23: qty 118

## 2021-09-23 MED ORDER — POVIDONE-IODINE 10 % EX SWAB: 2.0000 "application " | Freq: Once | CUTANEOUS | Status: DC

## 2021-09-23 MED ORDER — CEFAZOLIN SODIUM-DEXTROSE 2-4 GM/100ML-% IV SOLN
2.0000 g | INTRAVENOUS | Status: AC
  Filled 2021-09-23: qty 100

## 2021-09-23 NOTE — Progress Notes (Signed)
PROGRESS NOTE  Pt reports surgery `1/12- no pain.  LBM las tnight-  Irregular but better since restart Miralax.  CPM reordered.    ROS:  Pt denies SOB, abd pain, CP, N/V/C/D, and vision changes      Objective:   No results found. No results for input(s): WBC, HGB, HCT, PLT in the last 72 hours.   No results for input(s): NA, K, CL, CO2, GLUCOSE, BUN, CREATININE, CALCIUM in the last 72 hours.    Intake/Output Summary (Last 24 hours) at 09/23/2021 1500 Last data filed at 09/23/2021 1000 Gross per 24 hour  Intake 720 ml  Output 1650 ml  Net -930 ml        Physical Exam: Vital Signs Blood pressure 114/78, pulse 77, temperature 97.9 F (36.6 C), resp. rate 18, height 5\' 11"  (1.803 m), weight 82.6 kg, SpO2 98 %.    General: awake, alert, appropriate, ate 100% tray, NAD HENT: conjugate gaze; oropharynx moist CV: regular rate; no JVD Pulmonary: CTA B/L; no W/R/R- good air movement GI: soft, NT, ND, (+)BS Psychiatric: appropriate Neurological: alert    GI/Abdomen: BS +, non-tender, non-distended Ext: no clubbing, cyanosis, or edema Psych: pleasant and cooperative  Neuro:  Alert and oriented x 3. Normal insight and awareness. Intact Memory. Normal language and speech. Cranial nerve exam unremarkable. UE 5/5. LE 1/5 prox to 4/5 ADF/PF. No sensory deficits. No abnl tone Musculoskeletal: LE's still tender with PROM to an extent   Assessment/Plan: 1. Functional deficits which require 3+ hours per day of interdisciplinary therapy in a comprehensive inpatient rehab setting. Physiatrist is providing close team supervision and 24 hour management of active medical problems listed below. Physiatrist and rehab team continue to assess barriers to discharge/monitor patient progress toward functional and medical goals  Care Tool:  Bathing    Body parts bathed by patient: Right arm, Chest, Abdomen, Front perineal area, Face,  Left arm, Buttocks, Left upper leg, Right lower leg, Left lower leg, Right upper leg   Body parts bathed by helper: Left arm, Buttocks Body parts n/a: Right upper leg, Left upper leg, Right lower leg, Left lower leg   Bathing assist Assist Level: Contact Guard/Touching assist     Upper Body Dressing/Undressing Upper body dressing   What is the patient wearing?: Pull over shirt    Upper body assist Assist Level: Independent    Lower Body Dressing/Undressing Lower body dressing      What is the patient wearing?: Underwear/pull up, Pants     Lower body assist Assist for lower body dressing: Contact Guard/Touching assist Assistive Device Comment: Reacher   Toileting Toileting Toileting Activity did not occur (Clothing management and hygiene only): N/A (no void or bm) (idependent with urinal)  Toileting assist Assist for toileting: Moderate Assistance - Patient 50 - 74% Assistive Device Comment: BSC via beasy board   Transfers Chair/bed transfer  Transfers assist  Chair/bed transfer activity did not occur: Safety/medical concerns  Chair/bed transfer assist level: Supervision/Verbal cueing Chair/bed transfer assistive device: Sliding board   Locomotion Ambulation   Ambulation assist   Ambulation activity did not occur: Safety/medical concerns          Walk 10  feet activity   Assist  Walk 10 feet activity did not occur: Safety/medical concerns        Walk 50 feet activity   Assist Walk 50 feet with 2 turns activity did not occur: Safety/medical concerns         Walk 150 feet activity   Assist Walk 150 feet activity did not occur: Safety/medical concerns         Walk 10 feet on uneven surface  activity   Assist Walk 10 feet on uneven surfaces activity did not occur: Safety/medical concerns         Wheelchair     Assist Is the patient using a wheelchair?: Yes      Wheelchair assist level: Total Assistance - Patient < 25% Max  wheelchair distance: 150    Wheelchair 50 feet with 2 turns activity    Assist        Assist Level: Total Assistance - Patient < 25%   Wheelchair 150 feet activity     Assist      Assist Level: Total Assistance - Patient < 25%   Blood pressure 114/78, pulse 77, temperature 97.9 F (36.6 C), resp. rate 18, height 5\' 11"  (1.803 m), weight 82.6 kg, SpO2 98 %.  Medical Problem List and Plan: 1. Functional deficits secondary to mult trauma dn TBI and B/L pelvic fracture             -patient may not shower             -ELOS/Goals: 1/11- sup to mod I- for transfers  -Continue CIR therapies including PT, OT   -due to his RUE fx and NWB BLE, he would benefit from a left-sided one-arm-drive wheelchair  -surgery now planned 1/12? Will f/u with ortho  1/7- con't CIR-  PT and OT 2.  Impaired mobility: continue Lovenox--6 weeks total of AC recommended.              -antiplatelet therapy: N/A 3. Pain control: continue Tylenol qid, Gabapentin TID with robaxin prn. Add kpad             --oxycodone prn for severe pain              -- ice/heat prn for local  measures  1/6- pain controlled  4. Mood: LCSW to follow for evaluation and support.              -antipsychotic agents: N/A 5. Neuropsych: This patient is capable of making decisions on his own behalf. 6. Skin/Wound Care: Routine pressure relief measures.  7. Fluids/Electrolytes/Nutrition: Monitor I/O.               --encouraging fluid intake, BUN stable to sl improved   09/19/21- BMET nl  8. Pelvic ring fracture s/p SI nail: To be NWB BLE with bed to chair slide/lift transfers for 8 weeks. 9. R-Galeazzi fracture/dislocation s/p ORIF: NWB RUE--ok to WB thru elbow             --f/u with ortho re: plan  1/7- will have primary team check with Ortho this week about plan.  10 Right knee dislocation s/p MCL/posterior obique ligament repair: KI in place.    -1/5 no KI on RLE; CPM 0-65 degrees and increase by 5 degrees daily- goal 90  degrees prior to ACL/PCL reconstruction now scheduled for 1/12? 11. Left knee PCL/ACL derangement: Hinged brace for support and ?reconstruction in 4-6 weeks.    -bledsoe when oob, unlocked 12. Metabolic bone disease:  continue Vitamin D for supplement.  13. ABLA: hgb up to 11.4 today, some concentration effect. 14. Abnormal LFTS: improving    - Likely reactive, AP elevated due to ortho/bony trauma   -remainder of LFT's improved 15. Thrombocytopenia: resolved  16. Hyperglycemia: Stress induced v/s pre-diabetes.   hgb A1C 5.5  17. Hypoalbuminemia: improving. Continue supps  18. Constipation  1/7- is improving- restarted Miralax.       LOS: 16 days A FACE TO FACE EVALUATION WAS PERFORMED  Sharice Harriss 09/23/2021, 3:00 PM

## 2021-09-23 NOTE — Progress Notes (Signed)
Occupational Therapy Session Note  Patient Details  Name: Devin Jones MRN: [id number] Date of Birth: Mar 12, 1966  Today's Date: 09/24/2021 OT Individual Time: 1130-1200 OT Individual Time Calculation (min): 30 min    Skilled Therapeutic Interventions/Progress Updates:    Pt greeted in the w/c with no c/o pain. Agreeable to session. Started with blocked practice simulated clothing mgt using theraband as pt states that meeting these task demands of toileting are still challenging. Pt able to complete pants up/down with theraband x5 with min cues for problem solving and maintaining NWB Rt LE. Transitioned to gentle bilateral shoulder ROM with pt performing forward/backward shoulder rolls, shoulder shrugs, and scapular pinches x10 reps 2 sets with instruction. Issued pt a handout to incorporate Rt shoulder ROM during daily routine. He remained sitting up, in care of NT to assist with brushing his teeth.   Therapy Documentation Precautions:  Precautions Precautions: Fall Precaution Comments: NWB BLE 6-8 weeks, WB through R elbow only Required Braces or Orthoses:  (R knee immobilizer discontinued by ortho MD on 1/3) Other Brace: L bledsoe brace unlocked and donned when OOB Restrictions Weight Bearing Restrictions: Yes RUE Weight Bearing: Weight bear through elbow only RLE Weight Bearing: Non weight bearing LLE Weight Bearing: Non weight bearing Other Position/Activity Restrictions: NO weight bearing on L LE due ot pelvic fractures per ortho, cleared for R knee ROM on 12/22 focus on quad sets and achieving 90 degrees of knee flexion, L knee full ROM in bledsoe brace, cleared for full B hip ROM, cleared for R shoulder ROM as tolerated, no R elbow extension with resistance Pain: Pain Assessment Pain Scale: 0-10 Pain Score: 0-No pain Faces Pain Scale: No hurt ADL: ADL Eating: Minimal assistance Grooming: Minimal assistance Upper Body Bathing: Moderate assistance Lower Body Bathing:  Maximal assistance Where Assessed-Lower Body Bathing: Bed level Upper Body Dressing: Moderate assistance Where Assessed-Upper Body Dressing: Edge of bed Lower Body Dressing: Dependent Where Assessed-Lower Body Dressing: Bed level Toileting: Unable to assess Toilet Transfer: Unable to assess  Therapy/Group: Individual Therapy  Trell Secrist A Raesha Coonrod 09/24/2021, 1:08 PM

## 2021-09-24 NOTE — Progress Notes (Signed)
Physical Therapy TBI Note  Patient Details  Name: Devin Jones MRN: [id number] Date of Birth: 1965/10/31  Today's Date: 09/24/2021 PT Individual Time: 1030-1103 PT Individual Time Calculation (min): 33 min   Short Term Goals:  Week 3:  PT Short Term Goal 1 (Week 3): STGs = LTGs  Skilled Therapeutic Interventions/Progress Updates:   Pt resting in bed.  He denied pain.   neuromuscular re-education via multimodal cues for Supine- 2 x 10 R straight leg raises, R quad sets, R great toe drawing of the alphabet in the air, R heel slides.  AROM of R knee: 30 second hold x 3 of R knee in nearly 90 degrees, with hip at 90 degrees. PT supporting weight of LL.  Pt donned Bledsoe brace on LLE.  Slide board transfer to L bed> wc, level, with supervision, wt bearing through R elbow on stacked Yoga blocks, without cues.    At end of session, pt seated in wc with seat belt alarm set and needs at hand.  PT discussed with pt the possibility that he be allowed to propel himself to therapies , given coordination by his therapists regarding when he is already up in wc. Pt was very interested.    Therapy Documentation Precautions:  Precautions Precautions: Fall Precaution Comments: NWB BLE 6-8 weeks, WB through R elbow only Required Braces or Orthoses:  (R knee immobilizer discontinued by ortho MD on 1/3) Other Brace: L bledsoe brace unlocked and donned when OOB Restrictions Weight Bearing Restrictions: Yes RUE Weight Bearing: Weight bear through elbow only RLE Weight Bearing: Non weight bearing LLE Weight Bearing: Non weight bearing Other Position/Activity Restrictions: NO weight bearing on L LE due ot pelvic fractures per ortho, cleared for R knee ROM on 12/22 focus on quad sets and achieving 90 degrees of knee flexion, L knee full ROM in bledsoe brace, cleared for full B hip ROM, cleared for R shoulder ROM as tolerated, no R elbow extension with resistance     Agitated Behavior  Scale: TBI Observation Details Observation Environment: CIR Start of observation period - Date: 09/24/21 Start of observation period - Time: 1030 End of observation period - Date: 09/24/21 End of observation period - Time: 1104 Agitated Behavior Scale (DO NOT LEAVE BLANKS) Short attention span, easy distractibility, inability to concentrate: Absent Impulsive, impatient, low tolerance for pain or frustration: Absent Uncooperative, resistant to care, demanding: Absent Violent and/or threatening violence toward people or property: Absent Explosive and/or unpredictable anger: Absent Rocking, rubbing, moaning, or other self-stimulating behavior: Absent Pulling at tubes, restraints, etc.: Absent Wandering from treatment areas: Absent Restlessness, pacing, excessive movement: Absent Repetitive behaviors, motor, and/or verbal: Absent Rapid, loud, or excessive talking: Absent Sudden changes of mood: Absent Easily initiated or excessive crying and/or laughter: Absent Self-abusiveness, physical and/or verbal: Absent Agitated behavior scale total score: 14       Therapy/Group: Individual Therapy  Jacelynn Hayton 09/24/2021, 4:48 PM

## 2021-09-25 LAB — BASIC METABOLIC PANEL
Anion gap: 8 (ref 5–15)
BUN: 14 mg/dL (ref 6–20)
CO2: 27 mmol/L (ref 22–32)
Calcium: 9.5 mg/dL (ref 8.9–10.3)
Chloride: 105 mmol/L (ref 98–111)
Creatinine, Ser: 0.83 mg/dL (ref 0.61–1.24)
GFR, Estimated: >60 mL/min (ref >60)
Glucose, Bld: 101 mg/dL — ABNORMAL HIGH (ref 70–99)
Potassium: 4.2 mmol/L (ref 3.5–5.1)
Sodium: 140 mmol/L (ref 135–145)

## 2021-09-25 NOTE — Progress Notes (Signed)
Physical Therapy TBI Note  Patient Details  Name: Devin Jones MRN: [id number] Date of Birth: 1965-10-26  Today's Date: 09/25/2021 PT Individual Time: 0808-0901 PT Individual Time Calculation (min): 53 min   Short Term Goals: Week 3:  PT Short Term Goal 1 (Week 3): STGs = LTGs  Skilled Therapeutic Interventions/Progress Updates:     Pt received supine in bed and agrees to therapy. No complaint of pain. Pt performs supine to sit without bed features and with verbal cues for positioning at EOB. Pt performs sliding board transfer to [facility] with setup assistance and cues for optimal positioning of sliding board for safety. Pt self propels WC independently over level ground. Pt completes ramp navigation with verbal cues on propulsion technique and safety. Pt then self propels WC to main gym, 150'. Pt transfers to mat table with sliding board and cues for positioning. Sit to supine independently. Pt performs 2x15: [employer] SAQs Performed for strengthening of bilateral lower extremities as well as facilitating optimal ROM prior to surgery on Thursday.  Supine to sit independently. Sliding board transfer back to [facility] with setup assistance. Pt left seated in WC with all needs within reach. RN staff present in room.   Therapy Documentation Precautions:  Precautions Precautions: Fall Precaution Comments: NWB BLE 6-8 weeks, WB through R elbow only Required Braces or Orthoses:  (R knee immobilizer discontinued by ortho MD on 1/3) Other Brace: L bledsoe brace unlocked and donned when OOB Restrictions Weight Bearing Restrictions: Yes RUE Weight Bearing: Weight bear through elbow only RLE Weight Bearing: Non weight bearing LLE Weight Bearing: Non weight bearing Other Position/Activity Restrictions: NO weight bearing on L LE due ot pelvic fractures per ortho, cleared for R knee ROM on 12/22 focus on quad sets and achieving 90 degrees of knee flexion, L knee full ROM in  bledsoe brace, cleared for full B hip ROM, cleared for R shoulder ROM as tolerated, no R elbow extension with resistance Agitated Behavior Scale: TBI Observation Details Observation Environment: CIR Start of observation period - Date: 09/25/21 Start of observation period - Time: 1403 End of observation period - Date: 09/25/21 End of observation period - Time: 1500 Agitated Behavior Scale (DO NOT LEAVE BLANKS) Short attention span, easy distractibility, inability to concentrate: Absent Impulsive, impatient, low tolerance for pain or frustration: Absent Uncooperative, resistant to care, demanding: Absent Violent and/or threatening violence toward people or property: Absent Explosive and/or unpredictable anger: Absent Rocking, rubbing, moaning, or other self-stimulating behavior: Absent Pulling at tubes, restraints, etc.: Absent Wandering from treatment areas: Absent Restlessness, pacing, excessive movement: Absent Repetitive behaviors, motor, and/or verbal: Absent Rapid, loud, or excessive talking: Absent Sudden changes of mood: Absent Easily initiated or excessive crying and/or laughter: Absent Self-abusiveness, physical and/or verbal: Absent Agitated behavior scale total score: 14   Therapy/Group: Individual Therapy  Beau Fanny, PT, DPT 09/25/2021, 3:59 PM

## 2021-09-25 NOTE — Progress Notes (Signed)
Occupational Therapy TBI Note  Patient Details  Name: Devin Jones MRN: [id number] Date of Birth: 21-Oct-1965  Today's Date: 09/25/2021 OT Individual Time: 1594-5859 OT Individual Time Calculation (min): 60 min   Short Term Goals: Week 3:  OT Short Term Goal 1 (Week 3): LTG=STG 2/2 ELOS  Skilled Therapeutic Interventions/Progress Updates:    Pt greeted semi-reclined in bed and agreeable to OT treatment session. Discussed that patients wc will not fit into bathroom at home. Talked about maybe going to the gym for a shower once a month, but discussed bed level sponge baths and using wc hair washing tray as well. Pt completed bed mobility mod I. He then completed slidebaord transfer with only set-up of wc. OT placed wide drop arm BSC over regular commode and pt was able to slideboard over with dysom and supervision. Lateral leans for clothing management and hygiene after successful BM and voided bladder. Pt's L hinged knee brace over pants, so pt needed reacher to remove all the way off of R leg, but needed assistance to get it back on due to positioning. Practiced different ways for clothing management on BSC with pt able to pull all the way up from lateral leans. Transferred back to wc using slideboard with set-up. Pt propelled wc to therapy gym and transferred to therapy mat in similar fashion. Continued working on lateral leans with card matching activity and cards placed under buttocks and thighs. R UE there-ex pushing wash cloth across table for shoulder ROM, then isolated joint ROM of each finger on R hand. Pt returned to room at end of session and left seated up in wc with call bell in reach and needs met.   Therapy Documentation Precautions:  Precautions Precautions: Fall Precaution Comments: NWB BLE 6-8 weeks, WB through R elbow only Required Braces or Orthoses:  (R knee immobilizer discontinued by ortho MD on 1/3) Other Brace: L bledsoe brace unlocked and donned when  OOB Restrictions Weight Bearing Restrictions: Yes RUE Weight Bearing: Weight bear through elbow only RLE Weight Bearing: Non weight bearing LLE Weight Bearing: Non weight bearing Other Position/Activity Restrictions: NO weight bearing on L LE due ot pelvic fractures per ortho, cleared for R knee ROM on 12/22 focus on quad sets and achieving 90 degrees of knee flexion, L knee full ROM in bledsoe brace, cleared for full B hip ROM, cleared for R shoulder ROM as tolerated, no R elbow extension with resistance Pain:  Denies pain Agitated Behavior Scale: TBI Observation Details Observation Environment: CIR Start of observation period - Date: 09/25/21 Start of observation period - Time: 1403 End of observation period - Date: 09/25/21 End of observation period - Time: 1503 Agitated Behavior Scale (DO NOT LEAVE BLANKS) Short attention span, easy distractibility, inability to concentrate: Absent Impulsive, impatient, low tolerance for pain or frustration: Absent Uncooperative, resistant to care, demanding: Absent Violent and/or threatening violence toward people or property: Absent Explosive and/or unpredictable anger: Absent Rocking, rubbing, moaning, or other self-stimulating behavior: Absent Pulling at tubes, restraints, etc.: Absent Wandering from treatment areas: Absent Restlessness, pacing, excessive movement: Absent Repetitive behaviors, motor, and/or verbal: Absent Rapid, loud, or excessive talking: Absent Sudden changes of mood: Absent Easily initiated or excessive crying and/or laughter: Absent Self-abusiveness, physical and/or verbal: Absent Agitated behavior scale total score: 14    Therapy/Group: Individual Therapy  Valma Cava 09/25/2021, 2:38 PM

## 2021-09-25 NOTE — Progress Notes (Signed)
PROGRESS NOTE  Up at EOB with PT. No new complaints. Little pain. Tolerating movement without KI on RLE  ROS: Patient denies fever, rash, sore throat, blurred vision, nausea, vomiting, diarrhea, cough, shortness of breath or chest pain,  headache, or mood change.       Objective:   No results found. Recent Labs    09/23/21 1850  WBC 6.4  HGB 12.4*  HCT 38.9*  PLT 240     Recent Labs    09/23/21 1850 09/25/21 0607  NA 138 140  K 3.8 4.2  CL 104 105  CO2 23 27  GLUCOSE 136* 101*  BUN 18 14  CREATININE 0.86 0.83  CALCIUM 8.9 9.5      Intake/Output Summary (Last 24 hours) at 09/25/2021 0850 Last data filed at 09/25/2021 0253 Gross per 24 hour  Intake 360 ml  Output 1000 ml  Net -640 ml        Physical Exam: Vital Signs Blood pressure 101/74, pulse 82, temperature 97.8 F (36.6 C), resp. rate 18, height 5\' 11"  (1.803 m), weight 82.6 kg, SpO2 97 %.    Constitutional: No distress . Vital signs reviewed. HEENT: NCAT, EOMI, oral membranes moist Neck: supple Cardiovascular: RRR without murmur. No JVD    Respiratory/Chest: CTA Bilaterally without wheezes or rales. Normal effort    GI/Abdomen: BS +, non-tender, non-distended Ext: no clubbing, cyanosis, or edema Psych: pleasant and cooperative  Neuro:  Alert and oriented x 3. Normal insight and awareness. Intact Memory. Normal language and speech. Cranial nerve exam unremarkable. UE 5/5. LE 1/5 prox to 4/5 ADF/PF. No sensory deficits. No abnl tone Musculoskeletal: very little with AROM at either knee. Left knee in bledsoe   Assessment/Plan: 1. Functional deficits which require 3+ hours per day of interdisciplinary therapy in a comprehensive inpatient rehab setting. Physiatrist is providing close team supervision and 24 hour management of active medical problems listed below. Physiatrist and rehab team continue to assess barriers to discharge/monitor patient  progress toward functional and medical goals  Care Tool:  Bathing    Body parts bathed by patient: Right arm, Chest, Abdomen, Front perineal area, Face, Left arm, Buttocks, Left upper leg, Right lower leg, Left lower leg, Right upper leg   Body parts bathed by helper: Left arm, Buttocks Body parts n/a: Right upper leg, Left upper leg, Right lower leg, Left lower leg   Bathing assist Assist Level: Contact Guard/Touching assist     Upper Body Dressing/Undressing Upper body dressing   What is the patient wearing?: Pull over shirt    Upper body assist Assist Level: Independent    Lower Body Dressing/Undressing Lower body dressing      What is the patient wearing?: Underwear/pull up, Pants     Lower body assist Assist for lower body dressing: Contact Guard/Touching assist Assistive Device Comment: Reacher   Toileting Toileting Toileting Activity did not occur (Clothing management and hygiene only): N/A (no void or bm) (idependent with urinal)  Toileting assist Assist for toileting: Moderate Assistance - Patient 50 - 74% Assistive Device Comment: BSC via beasy board   Transfers Chair/bed transfer  Transfers assist  Chair/bed transfer activity did not occur: Safety/medical  concerns  Chair/bed transfer assist level: Supervision/Verbal cueing Chair/bed transfer assistive device: Sliding board   Locomotion Ambulation   Ambulation assist   Ambulation activity did not occur: Safety/medical concerns          Walk 10 feet activity   Assist  Walk 10 feet activity did not occur: Safety/medical concerns        Walk 50 feet activity   Assist Walk 50 feet with 2 turns activity did not occur: Safety/medical concerns         Walk 150 feet activity   Assist Walk 150 feet activity did not occur: Safety/medical concerns         Walk 10 feet on uneven surface  activity   Assist Walk 10 feet on uneven surfaces activity did not occur: Safety/medical  concerns         Wheelchair     Assist Is the patient using a wheelchair?: Yes      Wheelchair assist level: Total Assistance - Patient < 25% Max wheelchair distance: 150    Wheelchair 50 feet with 2 turns activity    Assist        Assist Level: Total Assistance - Patient < 25%   Wheelchair 150 feet activity     Assist      Assist Level: Total Assistance - Patient < 25%   Blood pressure 101/74, pulse 82, temperature 97.8 F (36.6 C), resp. rate 18, height 5\' 11"  (1.803 m), weight 82.6 kg, SpO2 97 %.  Medical Problem List and Plan: 1. 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 He will go home from their service  -Continue CIR therapies including PT, OT   -due to his RUE fx and NWB BLE, he would benefit from a left-sided one-arm-drive wheelchair    2.  Impaired mobility: continue Lovenox--6 weeks total of AC recommended.              -antiplatelet therapy: N/A 3. Pain control: continue Tylenol qid, Gabapentin TID with robaxin prn. Add kpad             --oxycodone prn for severe pain              -- ice/heat prn for local  measures  1/9- pain controlled  4. Mood: LCSW to follow for evaluation and support.              -antipsychotic agents: N/A 5. Neuropsych: This patient is capable of making decisions on his own behalf. 6. Skin/Wound Care: Routine pressure relief measures.  7. Fluids/Electrolytes/Nutrition:  .               -  solid intake   1/9 bmet wnl, protein supp for low albumin 8. Pelvic ring fracture s/p SI nail: To be NWB BLE with bed to chair slide/lift transfers for 8 weeks. 9. R-Galeazzi fracture/dislocation s/p ORIF: NWB RUE--ok to WB thru elbow             --f/u with ortho re: plan  1/7- will have primary team check with Ortho this week about plan.  10 Right knee dislocation s/p MCL/posterior  obique ligament repair: KI in place.    -1/5 no KI on RLE; CPM 0-65 degrees and increase by 5 degrees daily-  goal 90 degrees prior to ACL/PCL reconstruction now scheduled for 1/12 11. Left knee PCL/ACL derangement:     -bledsoe when oob, unlocked  -for surgical reconstruction 1/12 as well 12. Metabolic bone disease: continue Vitamin D for supplement.  13. ABLA: hgb up to 12.4   14. Abnormal LFTS: improving    - Likely reactive, AP elevated due to ortho/bony trauma   -remainder of LFT's improved 15. Thrombocytopenia: resolved  16. Hyperglycemia: Stress induced v/s pre-diabetes.   hgb A1C 5.5  17. Hypoalbuminemia: improving. Continue supps  18. Constipation   - improving- restarted Miralax.      At least 35 total minutes were spent in examination of patient, assessment of pertinent data,  formulation of a treatment plan, and in discussion with patient and/or family.    LOS: 18 days A FACE TO FACE EVALUATION WAS PERFORMED  Ranelle Oyster 09/25/2021, 8:50 AM

## 2021-09-25 NOTE — Progress Notes (Signed)
Physical Therapy TBI Note  Patient Details  Name: Devin Jones MRN: [id number] Date of Birth: 1966-07-04  Today's Date: 09/25/2021 PT Individual Time: 1100-1145 PT Individual Time Calculation (min): 45 min   Short Term Goals: Week 3:  PT Short Term Goal 1 (Week 3): STGs = LTGs  Skilled Therapeutic Interventions/Progress Updates:     Patient in w/c in the room upon PT arrival. Patient alert and agreeable to PT session. Patient denied pain during session.  Focused session on d/c planning, reviewed house measurement sheet and discussed benefits of narrower w/c seat, recommend 16" width by 18" length for improved patient fit and home negotiation. Patient reports a ramp is being built by his brother today and tomorrow and will be complete by d/c. Patient eager to problem solve accessing his bathroom, differed task to OT session, OT aware. Patient reports his brother is buying a bed with 21" height for level slide board transfers. Focused session simulated car transfer training with education on pressure relief and increased independence with w/c>bed transfers.   Therapeutic Activity: Bed Mobility: Patient performed sit to supine with supervision-mod I in a flat bed without use of bed rails, demonstrates he is able to bring both legs up on the bed one at a time without assist.  Transfers: Patient performed a simulated low SUV height car transfer using a slide board with min A for incline into the car with PT bracing R hip to prevent sliding backwards on the board and stabilizing the w/c for safety. Performed descent into w/c with supervision. Patient able to independently place board with 1 cue for positioning. Utilized teach-back method for set-up and how PT should assist, learned in previous session, with 2 cues for recall.   Patient performed a slide board transfer w/c>bed with supervision for safety and independent with board placement. Patient maintains all weight bearing precautions without  cues and proper technique with all transfers.  Wheelchair Mobility:  Patient propelled wheelchair >55 feet x2 with mod I using 1-wheel drive. Patient unable to recall pressure relief methods discussed in a session >1 week ago. Reviewed use of lateral leans with elbows on the arm rest to clear the opposite IT 2x30 sec every 30 min. Patient performed this technique counting to 30 each time. Denies pain and demonstrated good clearance with technique.  Patient in bed at end of session with breaks locked, bed alarm set, and all needs within reach.   Therapy Documentation Precautions:  Precautions Precautions: Fall Precaution Comments: NWB BLE 6-8 weeks, WB through R elbow only Required Braces or Orthoses:  (R knee immobilizer discontinued by ortho MD on 1/3) Other Brace: L bledsoe brace unlocked and donned when OOB Restrictions Weight Bearing Restrictions: Yes RUE Weight Bearing: Weight bear through elbow only RLE Weight Bearing: Non weight bearing LLE Weight Bearing: Non weight bearing Other Position/Activity Restrictions: NO weight bearing on L LE due ot pelvic fractures per ortho, cleared for R knee ROM on 12/22 focus on quad sets and achieving 90 degrees of knee flexion, L knee full ROM in bledsoe brace, cleared for full B hip ROM, cleared for R shoulder ROM as tolerated, no R elbow extension with resistance  Agitated Behavior Scale: TBI Observation Details Observation Environment: CIR Start of observation period - Date: 09/25/21 Start of observation period - Time: 1100 End of observation period - Date: 09/25/21 End of observation period - Time: 1145 Agitated Behavior Scale (DO NOT LEAVE BLANKS) Short attention span, easy distractibility, inability to concentrate: Absent Impulsive, impatient,  low tolerance for pain or frustration: Absent Uncooperative, resistant to care, demanding: Absent Violent and/or threatening violence toward people or property: Absent Explosive and/or  unpredictable anger: Absent Rocking, rubbing, moaning, or other self-stimulating behavior: Absent Pulling at tubes, restraints, etc.: Absent Wandering from treatment areas: Absent Restlessness, pacing, excessive movement: Absent Repetitive behaviors, motor, and/or verbal: Absent Rapid, loud, or excessive talking: Absent Sudden changes of mood: Absent Easily initiated or excessive crying and/or laughter: Absent Self-abusiveness, physical and/or verbal: Absent Agitated behavior scale total score: 14   Therapy/Group: Individual Therapy  Keilly Fatula L Akansha Wyche PT, DPT  09/25/2021, 5:11 PM
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
## 2021-09-25 NOTE — Progress Notes (Signed)
Patient ID: Devin Jones, male   DOB: [birth year], 56 y.o.   MRN: [id number]  SW met with pt in room to provide updates about loaner w/c that will be brought by [facility]. Will confirm the day is able too. Pt is aware he will be set up for [facility] medication assistance, and will explore charity [facility] for therapies. TTB and slide board delivered to pt room.   Loralee Pacas, MSW, [place] Office: [phone] Cell: [phone] Fax: [phone]

## 2021-09-25 NOTE — Progress Notes (Signed)
Occupational Therapy Session Note  Patient Details  Name: Devin Jones MRN: [id number] Date of Birth: 1966-03-04  Today's Date: 09/25/2021 OT Individual Time: 1015-1058 OT Individual Time Calculation (min): 43 min    Short Term Goals: Week 3:  OT Short Term Goal 1 (Week 3): LTG=STG 2/2 ELOS  Skilled Therapeutic Interventions/Progress Updates:  Pt greeted supine in bed agreeable to OT intervention. Session focus on functional transfers and DC planning. Pt reports his brother measured the doorways and his w/c will no fit into bathroom for showering. Spent time brain storming how to access shower with options presented such as obtaining rolling shower chair, transport chair, going to hotel and getting an ADA accessible room. None of the options seemed to be too feasible, pt will likely need to do sponge baths for a while until a better plan can be determined (updated primary OTR). Pt completed supine>sit with supervision, Sb transfer to L side to w/c with supervision using new SB. Pt completed w/c propulsion to tub room MOD I with pt abel to SB to TTB ( 22 inches) with supervision. Education provided on fall prevention strategies for showering as well as making sure to wear shorts in/out of shower to protect skin integrity. Pt completed w/c propulsion  back to room MOD I.  pt left up in w/c with all needs within reach.                   Therapy Documentation Precautions:  Precautions Precautions: Fall Precaution Comments: NWB BLE 6-8 weeks, WB through R elbow only Required Braces or Orthoses:  (R knee immobilizer discontinued by ortho MD on 1/3) Other Brace: L bledsoe brace unlocked and donned when OOB Restrictions Weight Bearing Restrictions: Yes RUE Weight Bearing: Weight bear through elbow only RLE Weight Bearing: Non weight bearing LLE Weight Bearing: Non weight bearing Other Position/Activity Restrictions: NO weight bearing on L LE due ot pelvic fractures per ortho, cleared for R  knee ROM on 12/22 focus on quad sets and achieving 90 degrees of knee flexion, L knee full ROM in bledsoe brace, cleared for full B hip ROM, cleared for R shoulder ROM as tolerated, no R elbow extension with resistance  Pain: no pain reported during session     Therapy/Group: Individual Therapy  Pollyann Glen [facility] 09/25/2021, 11:58 AM

## 2021-09-26 ENCOUNTER — Telehealth: Payer: Self-pay

## 2021-09-26 ENCOUNTER — Other Ambulatory Visit (HOSPITAL_COMMUNITY): Payer: Self-pay

## 2021-09-26 MED ORDER — ASCORBIC ACID 500 MG PO TABS
500.0000 mg | ORAL_TABLET | Freq: Every day | ORAL | 0 refills | Status: DC
  Filled 2021-09-26: qty 30, 30d supply, fill #0

## 2021-09-26 MED ORDER — CHOLECALCIFEROL 50 MCG (2000 UT) PO TABS
2000.0000 [IU] | ORAL_TABLET | Freq: Two times a day (BID) | ORAL | 0 refills | Status: DC
  Filled 2021-09-26: qty 60, 30d supply, fill #0

## 2021-09-26 MED ORDER — FOLIC ACID 1 MG PO TABS
1.0000 mg | ORAL_TABLET | Freq: Every day | ORAL | 0 refills | Status: DC
  Filled 2021-09-26: qty 30, 30d supply, fill #0

## 2021-09-26 MED ORDER — SELENIUM SULFIDE 1 % EX LOTN
1.0000 "application " | TOPICAL_LOTION | Freq: Every day | CUTANEOUS | 0 refills | Status: DC
  Filled 2021-09-26: qty 325, 325d supply, fill #0

## 2021-09-26 MED ORDER — OXYCODONE HCL 10 MG PO TABS: 10.0000 mg | ORAL_TABLET | Freq: Four times a day (QID) | ORAL | 0 refills | Status: DC | PRN

## 2021-09-26 MED ORDER — GABAPENTIN 300 MG PO CAPS
300.0000 mg | ORAL_CAPSULE | Freq: Three times a day (TID) | ORAL | 0 refills | Status: DC
  Filled 2021-09-26: qty 90, 30d supply, fill #0

## 2021-09-26 MED ORDER — POLYETHYLENE GLYCOL 3350 17 GM/SCOOP PO POWD
17.0000 g | Freq: Two times a day (BID) | ORAL | 0 refills | Status: DC
  Filled 2021-09-26: qty 238, 7d supply, fill #0

## 2021-09-26 MED ORDER — SENNOSIDES-DOCUSATE SODIUM 8.6-50 MG PO TABS
2.0000 | ORAL_TABLET | Freq: Every day | ORAL | 0 refills | Status: DC
  Filled 2021-09-26: qty 60, 30d supply, fill #0

## 2021-09-26 MED ORDER — CERTAVITE/ANTIOXIDANTS PO TABS
1.0000 | ORAL_TABLET | Freq: Every day | ORAL | 0 refills | Status: DC
  Filled 2021-09-26: qty 30, 30d supply, fill #0

## 2021-09-26 MED ORDER — ACETAMINOPHEN 325 MG PO TABS
650.0000 mg | ORAL_TABLET | Freq: Four times a day (QID) | ORAL | 0 refills | Status: DC
  Filled 2021-09-26: qty 60, 8d supply, fill #0

## 2021-09-26 MED ORDER — THIAMINE HCL 100 MG PO TABS
100.0000 mg | ORAL_TABLET | Freq: Every day | ORAL | 0 refills | Status: DC
  Filled 2021-09-26: qty 30, 30d supply, fill #0

## 2021-09-26 MED ORDER — OXYCODONE HCL 10 MG PO TABS
10.0000 mg | ORAL_TABLET | Freq: Four times a day (QID) | ORAL | 0 refills | Status: DC | PRN
  Filled 2021-09-26: qty 20, 5d supply, fill #0

## 2021-09-26 MED ORDER — SIMETHICONE 80 MG PO CHEW
80.0000 mg | CHEWABLE_TABLET | Freq: Four times a day (QID) | ORAL | 0 refills | Status: DC | PRN
  Filled 2021-09-26: qty 30, 8d supply, fill #0

## 2021-09-26 MED ORDER — METHOCARBAMOL 500 MG PO TABS
500.0000 mg | ORAL_TABLET | Freq: Four times a day (QID) | ORAL | 0 refills | Status: DC | PRN
  Filled 2021-09-26: qty 60, 15d supply, fill #0

## 2021-09-26 NOTE — Telephone Encounter (Signed)
I spoke with them earlier today about stopping his Lovenox before surgery.  Dc'ed today

## 2021-09-26 NOTE — Progress Notes (Deleted)
Dig stim performed. Hard stool felt in rectum. Small amount removed. Pt tolerated well. Suppository inserted. Pt instructed to call for bathroom needs. °Mahrukh Seguin J Marisha Renier, LPN   °

## 2021-09-26 NOTE — Progress Notes (Addendum)
Occupational Therapy Discharge Summary  Patient Details  Name: Devin Jones MRN: [id number] Date of Birth: 02/02/1966  Patient has met 6 of 6 long term goals due to improved activity tolerance, improved balance, postural control, and ability to compensate for deficits.  Patient to discharge at overall Modified Independent level with occasional set-up A provided by brother.   Reasons goals not met: n/a  Recommendation:  Patient will benefit from ongoing skilled OT services in home health setting to continue to advance functional skills in the area of BADL.  Equipment: One-arm drive wc, tub transfer bench, wide drop arm BSC, slideboard  Reasons for discharge: treatment goals met and discharge from hospital  Patient/family agrees with progress made and goals achieved: Yes  OT Discharge Precautions/Restrictions  Precautions Precautions: Fall Precaution Comments: NWB BLE 6-8 weeks, WB through R elbow only Other Brace: L bledsoe brace unlocked and donned when OOB Restrictions Weight Bearing Restrictions: Yes RUE Weight Bearing: Weight bear through elbow only RLE Weight Bearing: Non weight bearing LLE Weight Bearing: Non weight bearing Other Position/Activity Restrictions: NO weight bearing on L LE due ot pelvic fractures per ortho, cleared for R knee ROM on 12/22, L knee full ROM in bledsoe brace, cleared for full B hip ROM, cleared for R shoulder ROM as tolerated, no R elbow extension with resistance Pain  Denies pain ADL ADL Eating: Modified independent Grooming: Independent Upper Body Bathing: Modified independent Lower Body Bathing: Modified independent Where Assessed-Lower Body Bathing: Bed level and shower Upper Body Dressing: Modified independent (Device) Where Assessed-Upper Body Dressing: Edge of bed Lower Body Dressing: Modified independent Where Assessed-Lower Body Dressing: Bed level and EOB Toileting: Setup Toilet Transfer: Modified independent Tub/Shower  Transfer: Distant supervision Perception  Perception: Within Functional Limits Praxis Praxis: Intact Cognition Overall Cognitive Status: Within Functional Limits for tasks assessed Arousal/Alertness: Awake/alert Orientation Level: Oriented X4 Year: 2023 Month: January Day of Week: Correct Attention: Selective Selective Attention: Appears intact Memory: Appears intact Immediate Memory Recall: Sock;Blue;Bed Memory Recall Sock: Without Cue Memory Recall Blue: Without Cue Memory Recall Bed: Without Cue Awareness: Appears intact Problem Solving: Appears intact Safety/Judgment: Appears intact [employer] Scales of Cognitive Functioning: Purposeful/appropriate Sensation Sensation Light Touch: Impaired Detail Light Touch Impaired Details: Impaired RLE;Impaired LLE Hot/Cold: Appears Intact Proprioception: Appears Intact Stereognosis: Appears Intact Coordination Gross Motor Movements are Fluid and Coordinated: No Fine Motor Movements are Fluid and Coordinated: No Coordination and Movement Description: limited on R UE due to splint application Motor  Motor Motor - Discharge Observations: general weakness, NWB B LE Mobility  Bed Mobility Supine to Sit: Independent Sit to Supine: Independent  Balance Static Sitting Balance Static Sitting - Level of Assistance: 7: Independent Dynamic Sitting Balance Dynamic Sitting - Balance Support: During functional activity Dynamic Sitting - Level of Assistance: 7: Independent Extremity/Trunk Assessment RUE Assessment RUE Assessment: Exceptions to [facility] General Strength Comments: shoulder ROM limited to 80 degrees FF, not supposed to actively extend elbow against resistance-not tested. Pt in cast from forearm through wrist LUE Assessment LUE Assessment: Within Functional Limits   Daneen Schick Banks Chaikin 09/26/2021, 2:55 PM

## 2021-09-26 NOTE — Plan of Care (Signed)
°  Problem: RH Balance Goal: LTG: Patient will maintain dynamic sitting balance (OT) Description: LTG:  Patient will maintain dynamic sitting balance with assistance during activities of daily living (OT) Flowsheets (Taken 09/26/2021 0037) LTG: Pt will maintain dynamic sitting balance during ADLs with: Independent Note: Goal upgraded 1/9 due to progress-ESD   Problem: RH Bathing Goal: LTG Patient will bathe all body parts with assist levels (OT) Description: LTG: Patient will bathe all body parts with assist levels (OT) Flowsheets (Taken 09/26/2021 0037) LTG: Pt will perform bathing with assistance level/cueing: Set up assist  Note: Goal upgraded 1/9 due to progress-ESD   Problem: RH Dressing Goal: LTG Patient will perform upper body dressing (OT) Description: LTG Patient will perform upper body dressing with assist, with/without cues (OT). Flowsheets (Taken 09/26/2021 0037) LTG: Pt will perform upper body dressing with assistance level of: Independent with assistive device Note: Goal upgraded 1/9 due to progress-ESD   Problem: RH Toileting Goal: LTG Patient will perform toileting task (3/3 steps) with assistance level (OT) Description: LTG: Patient will perform toileting task (3/3 steps) with assistance level (OT)  Flowsheets (Taken 09/26/2021 0037) LTG: Pt will perform toileting task (3/3 steps) with assistance level: Set up assist Note: Goal upgraded 1/9 due to progress-ESD   Problem: RH Toilet Transfers Goal: LTG Patient will perform toilet transfers w/assist (OT) Description: LTG: Patient will perform toilet transfers with assist, with/without cues using equipment (OT) Flowsheets (Taken 09/26/2021 0037) LTG: Pt will perform toilet transfers with assistance level of: Independent with assistive device Note: Goal upgraded 1/9 due to progress-ESD

## 2021-09-26 NOTE — Telephone Encounter (Signed)
Delle Reining, PA with Inpatient Rehab.would like a call back concerning questions about blood thinners for patient.  CB# [phone].  Please advise.  Thank you.

## 2021-09-26 NOTE — Progress Notes (Signed)
Physical Therapy Discharge Summary  Patient Details  Name: Leonel Mccollum MRN: [id number] Date of Birth: [birth year]  Patient has met 8 of 8 long term goals due to improved activity tolerance, improved balance, improved postural control, increased strength, increased range of motion, decreased pain, and ability to compensate for deficits.  Patient to discharge at a wheelchair level Modified Independent for household wheelchair mobility with bed<>w/c transfers, supervision-CGA unlevel and car transfers, supervision community level wheelchair mobility, and min A ramp navigation.   Patient's care partner is independent to provide the necessary physical assistance at discharge.  Reasons goals not met: All PT goals met.  Recommendation:  Patient will benefit from ongoing skilled PT services in home health setting to continue to advance safe functional mobility, address ongoing impairments in strength/ROM, activity tolerance, community integration, progression with weight bearing and ROM in B lower extremities and R upper extremity, balance, functional mobility, patient/caregiver education, and minimize fall risk.  Equipment: Investment banker, operational from [employer]  Reasons for discharge: treatment goals met  Patient/family agrees with progress made and goals achieved: Yes  PT Discharge Precautions/Restrictions Precautions Precautions: Fall Precaution Comments: NWB BLE 6-8 weeks, WB through R elbow only Other Brace: L bledsoe brace unlocked and donned when OOB Restrictions Weight Bearing Restrictions: Yes RUE Weight Bearing: Weight bear through elbow only RLE Weight Bearing: Non weight bearing LLE Weight Bearing: Non weight bearing Other Position/Activity Restrictions: NO weight bearing on L LE due ot pelvic fractures per ortho, cleared for R knee ROM on 12/22 focus on quad sets and achieving 90 degrees of knee flexion, L knee full ROM in bledsoe brace, cleared  for full B hip ROM, cleared for R shoulder ROM as tolerated, no R elbow extension with resistance Pain Interference Pain Interference Pain Effect on Sleep: 0. Does not apply - I have not had any pain or hurting in the past 5 days Pain Interference with Therapy Activities: 0. Does not apply - I have not received rehabilitationtherapy in the past 5 days Pain Interference with Day-to-Day Activities: 1. Rarely or not at all Vision/Perception  Vision - History Ability to See in Adequate Light: 0 Adequate Perception Perception: Within Functional Limits Praxis Praxis: Intact  Cognition Overall Cognitive Status: Within Functional Limits for tasks assessed Arousal/Alertness: Awake/alert Selective Attention: Appears intact Memory: Appears intact Awareness: Appears intact Problem Solving: Appears intact Safety/Judgment: Appears intact Sensation Sensation Light Touch: Impaired Detail Light Touch Impaired Details: Impaired RLE;Impaired LLE Hot/Cold: Appears Intact Proprioception: Appears Intact Stereognosis: Appears Intact Additional Comments: mild parasthesia in BLE near ankle Coordination Gross Motor Movements are Fluid and Coordinated: No Fine Motor Movements are Fluid and Coordinated: No Heel Shin Test: bledsoe on the lL Motor  Motor Motor: Abnormal postural alignment and control Motor - Skilled Clinical Observations: NWB B BLE, difficulty lifting B LE's Motor - Discharge Observations: general weakness, NWB B LE  Mobility   Locomotion  Gait Ambulation: No Gait Gait: No Stairs / Additional Locomotion Stairs: No Pick up small object from the floor (from standing position) activity did not occur:  (can only perform from wheelchair level) Wheelchair Mobility Wheelchair Mobility: Yes Wheelchair Assistance: Independent with assistive device Wheelchair Propulsion: Left upper extremity Wheelchair Parts Management: Independent Distance: 150  Trunk/Postural Assessment  Cervical  Assessment Cervical Assessment: Within Functional Limits Thoracic Assessment Thoracic Assessment: Within Functional Limits Lumbar Assessment Lumbar Assessment: Exceptions to [facility] Postural Control Postural Control: Within Functional Limits  Balance Balance Balance Assessed: Yes Standardized Balance Assessment Standardized Balance  Assessment: FIST Function in Sitting Test = FIST Anterior Nudge: superior sternum: Independent Posterior Nudge: between scapular spines: Independent Lateral Nudge: to dominant side at acromion: Independent Static Sitting: 30 seconds: Independent Sitting, Shake "No": left and right: Independent Sitting, Eyes Closed: 30 seconds: Independent Sitting, Lift Foot: dominant side, lift foot 1 inch twice: Independent Pick Up Object From Behind: object at midline, hands breadth posterior: Independent Forward Reach: use dominant arm, must complete full motion: Independent Lateral Reach: use dominant arm, clear opposite ischial tuberosity: Independent Pick Up Object From Floor: from between feet: Independent Posterior Scooting: move backwards 2 inches: Independent Anterior Scooting: move forward 2 inches: Independent Lateral Scooting: move to dominent side 2 inches: Independent FIST TOTAL SCORE: 56 Static Sitting Balance Static Sitting - Balance Support: Feet supported Static Sitting - Level of Assistance: 7: Independent Dynamic Sitting Balance Dynamic Sitting - Balance Support: Feet supported;During functional activity Dynamic Sitting - Level of Assistance: 7: Independent Extremity Assessment  RLE Assessment RLE Assessment: Exceptions to [facility] General Strength Comments: Grossly 3+ to 4/5 throughout RLE AROM (degrees) Right Hip Extension: 0 Right Hip Flexion: 118 Right Hip ABduction: 40 Right Hip ADduction: 11 Right Knee Extension: -6 Right Knee Flexion: 108 Right Ankle Dorsiflexion: 12 LLE Assessment LLE Assessment: Exceptions to [facility] General Strength  Comments: Grossly 3+ to 4/5 throughout LLE AROM (degrees) Left Hip Extension: 0 Left Hip Flexion: 110 Left Hip ABduction: 25 Left Hip ADduction: 12 Left Knee Extension: 0 Left Knee Flexion: 120 Left Ankle Dorsiflexion: 14    Debbora Dus PT, DPT Hae Ahlers PT, DPT 09/26/2021, 7:00 PM

## 2021-09-26 NOTE — Discharge Instructions (Addendum)
Inpatient Rehab Discharge Instructions  Devin Jones Discharge date and time: No discharge date for patient encounter.   Activities/Precautions/ Functional Status: Activity:  Nonweightbearing bilateral lower extremities for 6 to 8 weeks.  Left Bledsoe brace unlocked and donned when out of bed.  Weightbearing as tolerated to right elbow only Diet: regular diet Wound Care: Routine skin checks Functional status:  ___ No restrictions     ___ Walk up steps independently ___ 24/7 supervision/assistance   ___ Walk up steps with assistance ___ Intermittent supervision/assistance  ___ Bathe/dress independently ___ Walk with walker     __x_ Bathe/dress with assistance ___ Walk Independently    ___ Shower independently ___ Walk with assistance    ___ Shower with assistance ___ No alcohol     ___ Return to work/school ________   COMMUNITY REFERRALS UPON DISCHARGE:    Please defer to home exercise program provided due to home health charity declined. Orthopedic will make recommendations for home health or outpatient once weight bearing restrictions are lifted.   Medical Equipment/Items Ordered:one arm drive w/c (loaner)                                                 Agency/Supplier:[facility] [phone]  GENERAL COMMUNITY RESOURCES FOR PATIENT/FAMILY: You have been set up with [facility] [phone]. If you require transportation in the future, transportation is free to [employer] providers. Be sure to scheduled 2-3 days in advance to schedule your transportation.   Special Instructions: No driving smoking or alcohol   My questions have been answered and I understand these instructions. I will adhere to these goals and the provided educational materials after my discharge from the hospital.  Patient/Caregiver Signature _______________________________ Date __________  Clinician Signature _______________________________________ Date __________  Please bring this form  and your medication list with you to all your follow-up doctor's appointments.

## 2021-09-26 NOTE — Progress Notes (Signed)
Occupational Therapy TBI Note  Patient Details  Name: Stoy Fenn MRN: [id number] Date of Birth: 1965-09-24  Today's Date: 09/26/2021 Session 1 OT Individual Time: 7253-6644 OT Individual Time Calculation (min): 43 min   Session 2 OT Individual Time: 0347-4259 OT Individual Time Calculation (min): 60 min    Short Term Goals: Week 3:  OT Short Term Goal 1 (Week 3): LTG=STG 2/2 ELOS  Skilled Therapeutic Interventions/Progress Updates:   Session 1 Pt greeted seated in wc and agreeable to OT treatment session. Pt got medications and ready for therapy. Pt propelled wc to therapy gym mod I. OT went through R UE home exercise program including scapula elevation/depression, shoulder rolls, scapula protraction/retraction. Educated on self ROM with L UE assisting with R shoulder FF, as wlell as shoulder glides across table. Pt propelled wc back to room and completed slideboard transfer back to bed mod I.    Denies pain  09/26/21 1500  Observation Details  Observation Environment CIR  Start of observation period - Date 09/26/21  Start of observation period - Time 0847  End of observation period - Date 09/26/21  End of observation period - Time 0930  Agitated Behavior Scale (DO NOT LEAVE BLANKS)  Short attention span, easy distractibility, inability to concentrate 1  Impulsive, impatient, low tolerance for pain or frustration 1  Uncooperative, resistant to care, demanding 1  Violent and/or threatening violence toward people or property 1  Explosive and/or unpredictable anger 1  Rocking, rubbing, moaning, or other self-stimulating behavior 1  Pulling at tubes, restraints, etc. 1  Wandering from treatment areas 1  Restlessness, pacing, excessive movement 1  Repetitive behaviors, motor, and/or verbal 1  Rapid, loud, or excessive talking 1  Sudden changes of mood 1  Easily initiated or excessive crying and/or laughter 1  Self-abusiveness, physical and/or verbal 1  Agitated  behavior scale total score 14     Session 2  Pt greeted semi-reclined in bed and agreeable to OT treatment session. Pt agreeable to shower. Pt completed slideboard transer to drop arm wc mod I. Slideboard into shower in room using dysom for anti-slip. Lateral leans to doff pants, then pt able to remove L hinged knee brace. OT assist to don waterproof bag on R UE. Bathing completed without OT assist. Clothes and HKB donned from bench using lateral leans mod I. Pt completed grooming tasks from wc at the sink, then transferred back to bed mod I. Pt left semi-reclined in bed with bed alarm on, call bell in reach, and needs met.   Therapy Documentation Precautions:  Precautions Precautions: Fall Precaution Comments: NWB BLE 6-8 weeks, WB through R elbow only Required Braces or Orthoses:  (R knee immobilizer discontinued by ortho MD on 1/3) Other Brace: L bledsoe brace unlocked and donned when OOB Restrictions Weight Bearing Restrictions: Yes RUE Weight Bearing: Weight bear through elbow only RLE Weight Bearing: Non weight bearing LLE Weight Bearing: Non weight bearing Other Position/Activity Restrictions: NO weight bearing on L LE due ot pelvic fractures per ortho, cleared for R knee ROM on 12/22, L knee full ROM in bledsoe brace, cleared for full B hip ROM, cleared for R shoulder ROM as tolerated, no R elbow extension with resistance Pain:  Denies pain Agitated Behavior Scale: TBI Observation Details Observation Environment: CIR Start of observation period - Date: 09/26/21 Start of observation period - Time: 1403 End of observation period - Date: 09/26/21 End of observation period - Time: 1503 Agitated Behavior Scale (DO NOT LEAVE BLANKS) Short  attention span, easy distractibility, inability to concentrate: Absent Impulsive, impatient, low tolerance for pain or frustration: Absent Uncooperative, resistant to care, demanding: Absent Violent and/or threatening violence toward people or  property: Absent Explosive and/or unpredictable anger: Absent Rocking, rubbing, moaning, or other self-stimulating behavior: Absent Pulling at tubes, restraints, etc.: Absent Wandering from treatment areas: Absent Restlessness, pacing, excessive movement: Absent Repetitive behaviors, motor, and/or verbal: Absent Rapid, loud, or excessive talking: Absent Sudden changes of mood: Absent Easily initiated or excessive crying and/or laughter: Absent Self-abusiveness, physical and/or verbal: Absent Agitated behavior scale total score: 14   Therapy/Group: Individual Therapy  Valma Cava 09/26/2021, 3:23 PM

## 2021-09-26 NOTE — Progress Notes (Signed)
Physical Therapy TBI Note  Patient Details  Name: Devin Jones MRN: [id number] Date of Birth: Jan 27, 1966  Today's Date: 09/26/2021 PT Individual Time: 0805-0847 PT Individual Time Calculation (min): 42 min   Short Term Goals: Week 3:  PT Short Term Goal 1 (Week 3): STGs = LTGs  Skilled Therapeutic Interventions/Progress Updates:    Pt received supine in bed and agreeable to therapy session. Donned L LE Bledsoe brace min assist for time management. Pt reports no specific questions/concerns at this time. Supine>sitting R EOB, HOB flat and not using bedrails, independently while maintaining WBing restrictions without cuing. L lateral scoot transfer EOB>w/c using slide board (pt no longer needing yoga blocks for R UE support) mod-I with good set-up of board and ability to maintain WBing restrictions. Mod-I w/c mobility ~143ft to ortho gym using one-arm-drive chair with pt demoing excellent ability to navigate through doorways and set up w/c for transfers. Simulated car transfer ([employer] outback height) using slide board via lateral scoot with slight uphill into the car - pt demoing excellent placement of board and ability to scoot while maintaining WBing precautions with close supervision (pt leaves w/c leg rests in place as he is not allowed to WB anyway. Therapist and pt discussed problem solving car transfer in event he is not allowed to perform knee flexion after his next surgery - discussed option of either utilizing the back bench seat vs lying front passenger seat completely back to allow increased leg room.  Pt performed w/c ramp navigation using one-arm-drive chair with light min assist for safety while negotiating over ramp threshold but then able to push up ramp with close supervision and control descent with close supervision. Pt's brother currently working on building ramped entrance to pt's home.  MD in/out for morning assessment and discussed pt's ortho concerns of possible LE  restrictions post surgery and this therapist requested pt receive an acute PT consult prior to D/C to review new precautions and ensure safe mobility for D/C home.  Pt performed w/c mobility back to room. Pt had questions regarding bathing once D/C'd home and this therapist deferred to OT; however, did remind pt he may have restrictions on when he is allowed to get surgical incisions wet. Pt left seated in w/c with needs in reach awaiting OT arrival.    Therapy Documentation Precautions:  Precautions Precautions: Fall Precaution Comments: NWB BLE 6-8 weeks, WB through R elbow only Required Braces or Orthoses:  (R knee immobilizer discontinued by ortho MD on 1/3) Other Brace: L bledsoe brace unlocked and donned when OOB Restrictions Weight Bearing Restrictions: Yes RUE Weight Bearing: Weight bear through elbow only RLE Weight Bearing: Non weight bearing LLE Weight Bearing: Non weight bearing Other Position/Activity Restrictions: NO weight bearing on L LE due ot pelvic fractures per ortho, cleared for R knee ROM on 12/22 focus on quad sets and achieving 90 degrees of knee flexion, L knee full ROM in bledsoe brace, cleared for full B hip ROM, cleared for R shoulder ROM as tolerated, no R elbow extension with resistance   Pain:   Reports pain of 0/10.  Agitated Behavior Scale: TBI  Observation Details Observation Environment: CIR Start of observation period - Date: 09/26/21 Start of observation period - Time: 0805 End of observation period - Date: 09/26/21 End of observation period - Time: 0847 Agitated Behavior Scale (DO NOT LEAVE BLANKS) Short attention span, easy distractibility, inability to concentrate: Absent Impulsive, impatient, low tolerance for pain or frustration: Absent Uncooperative, resistant to care,  demanding: Absent Violent and/or threatening violence toward people or property: Absent Explosive and/or unpredictable anger: Absent Rocking, rubbing, moaning, or other  self-stimulating behavior: Absent Pulling at tubes, restraints, etc.: Absent Wandering from treatment areas: Absent Restlessness, pacing, excessive movement: Absent Repetitive behaviors, motor, and/or verbal: Absent Rapid, loud, or excessive talking: Absent Sudden changes of mood: Absent Easily initiated or excessive crying and/or laughter: Absent Self-abusiveness, physical and/or verbal: Absent Agitated behavior scale total score: 14     Therapy/Group: Individual Therapy  Ginny Forth , PT, DPT, NCS, CSRS  09/26/2021, 7:39 AM

## 2021-09-26 NOTE — Telephone Encounter (Signed)
noted 

## 2021-09-26 NOTE — Patient Care Conference (Signed)
Inpatient RehabilitationTeam Conference and Plan of Care Update Date: 09/26/2021   Time: 10:31 AM    Patient Name: Devin Jones      Medical Record Number: [id number]  Date of Birth: 30-May-1966 Sex: Male         Room/Bed: 4M03C/4M03C-01 Payor Info: Payor: MEDICAID PENDING / Plan: MEDICAID PENDING / Product Type: *No Product type* /    Admit Date/Time:  09/07/2021  3:59 PM  Primary Diagnosis:  TBI (traumatic brain injury)  Hospital Problems: Principal Problem:   TBI (traumatic brain injury) Active Problems:   Pelvic fracture [facility])   Multiple trauma    Expected Discharge Date: Expected Discharge Date: 09/28/21  Team Members Present: Physician leading conference: Dr. Faith Rogue Social Worker Present: Cecile Sheerer, LCSWA Nurse Present: Kennyth Arnold, RN PT Present: Merry Lofty, PT OT Present: Kearney Hard, OT PPS Coordinator present : Fae Pippin, SLP     Current Status/Progress Goal Weekly Team Focus  Bowel/Bladder   cont of bowel and bladder. LBM 1/9  remain cont of b/b  assess q shift and prn   Swallow/Nutrition/ Hydration             ADL's   Supervision/mod I  supervision/mod I  self-care retraining, pt.famly education, dc planning, transfers, wc tolerance, pressure relief   Mobility   supervision bed mobility, supervision transfers, independent WC mobility with one-arm-drive WC  Supervision  DC prep, strengthening of BLEs   Communication             Safety/Cognition/ Behavioral Observations            Pain   no complaints of pain  pain <3  assess q shift and prn   Skin   surgical incisions, healing  no new break down  assess q shift and prn     Discharge Planning:  Patient remains Medicaid pending. Patient will d/c to ortho for surgery on 1/12. SW will set up charity [facility] and [employer] med assistance. Pt will get loaner one arm drive from [facility] until [facility] approved. Receieved TTB and slide board from [employer] (charity).  Hospital F/u scheduledon Friday, January 27 @ 1:30pm with Bertram Denver, PA.   Team Discussion: Doing well medically. Surgery scheduled for Thursday. CPM up to 90. Has left knee brace. Applied for [facility], [employer], loaner WC provided by [employer]. Working on strategies for getting into home bathroom. Save outpatient therapies until weight bearing restrictions are lifted. Very cooperative.  Patient on target to meet rehab goals: yes, supervision goals.  *See Care Plan and progress notes for long and short-term goals.   Revisions to Treatment Plan:  Finalizing discharge plans.  Teaching Needs: Family education, medication management, safety awareness, weight bearing precautions, transfer training, etc.  Current Barriers to Discharge: Decreased caregiver support, Home enviroment access/layout, Wound care, Lack of/limited family support, Weight bearing restrictions, Medication compliance, and Pending surgery  Possible Resolutions to Barriers: Family education Provide HEP, if no [facility] available Set-up [facility]     Medical Summary Current Status: medically stable. pain controlled. wounds healed. to OR THursday  Barriers to Discharge: Medical stability   Possible Resolutions to [employer] Focus: daily assessment of labs and patient data   Continued Need for Acute Rehabilitation Level of Care: The patient requires daily medical management by a physician with specialized training in physical medicine and rehabilitation for the following reasons: Direction of a multidisciplinary physical rehabilitation program to maximize functional independence : Yes Medical management of patient stability for increased  activity during participation in an intensive rehabilitation regime.: Yes Analysis of laboratory values and/or radiology reports with any subsequent need for medication adjustment and/or medical intervention. : Yes   I attest that I was present, lead the team conference, and  concur with the assessment and plan of the team.   Tennis Must 09/26/2021, 2:52 PM

## 2021-09-26 NOTE — Progress Notes (Addendum)
Occupational Therapy TBI Note  Patient Details  Name: Devin Jones MRN: [id number] Date of Birth: 10-16-1965  Today's Date: 09/26/2021 OT Individual Time: 1300-1330 OT Individual Time Calculation (min): 30 min    Short Term Goals: Week 3:  OT Short Term Goal 1 (Week 3): LTG=STG 2/2 ELOS  Skilled Therapeutic Interventions/Progress Updates:    Pt resting in bed upon arrival.  D/c plans now changed and pt is discharging to acute on 1/12 for surgery. Discussed home setup, accessing bathroom/shower at home, and reviewed w/b precautions. Pt pleased with progress. Pt remained in bed with all needs within reach.  Therapy Documentation Precautions:  Precautions Precautions: Fall Precaution Comments: NWB BLE 6-8 weeks, WB through R elbow only Required Braces or Orthoses:  (R knee immobilizer discontinued by ortho MD on 1/3) Other Brace: L bledsoe brace unlocked and donned when OOB Restrictions Weight Bearing Restrictions: Yes RUE Weight Bearing: Weight bear through elbow only RLE Weight Bearing: Non weight bearing LLE Weight Bearing: Non weight bearing Other Position/Activity Restrictions: NO weight bearing on L LE due ot pelvic fractures per ortho, cleared for R knee ROM on 12/22 focus on quad sets and achieving 90 degrees of knee flexion, L knee full ROM in bledsoe brace, cleared for full B hip ROM, cleared for R shoulder ROM as tolerated, no R elbow extension with resistance   Pain: Pain Assessment Pain Scale: 0-10 Pain Score: 0-No pain Faces Pain Scale: No hurt Agitated Behavior Scale: TBI Observation Details Observation Environment: pt room Start of observation period - Date: 09/26/21 Start of observation period - Time: 1300 End of observation period - Date: 09/26/21 End of observation period - Time: 1330 Agitated Behavior Scale (DO NOT LEAVE BLANKS) Short attention span, easy distractibility, inability to concentrate: Absent Impulsive, impatient, low tolerance for pain  or frustration: Absent Uncooperative, resistant to care, demanding: Absent Violent and/or threatening violence toward people or property: Absent Explosive and/or unpredictable anger: Absent Rocking, rubbing, moaning, or other self-stimulating behavior: Absent Pulling at tubes, restraints, etc.: Absent Wandering from treatment areas: Absent Restlessness, pacing, excessive movement: Absent Repetitive behaviors, motor, and/or verbal: Absent Rapid, loud, or excessive talking: Absent Sudden changes of mood: Absent Easily initiated or excessive crying and/or laughter: Absent Self-abusiveness, physical and/or verbal: Absent Agitated behavior scale total score: 14    Therapy/Group: Individual Therapy  Rich Brave 09/26/2021, 1:33 PM

## 2021-09-26 NOTE — Progress Notes (Signed)
Physical Therapy Session Note  Patient Details  Name: Devin Jones MRN: [id number] Date of Birth: 08-28-1966  Today's Date: 09/26/2021 PT Individual Time: 0958-1030 PT Individual Time Calculation (min): 32 min   Short Term Goals: Week 3:  PT Short Term Goal 1 (Week 3): STGs = LTGs  Skilled Therapeutic Interventions/Progress Updates:    Patient received supine in bed, agreeable to PT. He denies pain. Able to come sit edge of bed independent and transfer to wc via slideboard with supervision. Patient propelling himself in wc using L UE ModI. Transfer to therapy mat with supervision and slideboard. Patient and PT reviewing safety at home and safety with transfers. Patient able to achieve slightly >90* flexion in R knee. PT reviewed scar management with patient. He transferred back to wc with supervision and slideboard and back to bed via slideboard with supervision. Bed alarm on, call light within reach.   Therapy Documentation Precautions:  Precautions Precautions: Fall Precaution Comments: NWB BLE 6-8 weeks, WB through R elbow only Required Braces or Orthoses:  (R knee immobilizer discontinued by ortho MD on 1/3) Other Brace: L bledsoe brace unlocked and donned when OOB Restrictions Weight Bearing Restrictions: Yes RUE Weight Bearing: Weight bear through elbow only RLE Weight Bearing: Non weight bearing LLE Weight Bearing: Non weight bearing Other Position/Activity Restrictions: NO weight bearing on L LE due ot pelvic fractures per ortho, cleared for R knee ROM on 12/22 focus on quad sets and achieving 90 degrees of knee flexion, L knee full ROM in bledsoe brace, cleared for full B hip ROM, cleared for R shoulder ROM as tolerated, no R elbow extension with resistance    Therapy/Group: Individual Therapy  Elizebeth Koller, PT, DPT, CBIS  09/26/2021, 10:35 AM

## 2021-09-26 NOTE — Progress Notes (Signed)
Patient ID: Devin Jones, male   DOB: [birth year], 56 y.o.   MRN: [id number]  SW sent charity [facility] referral to [facility] and waiting on follow-up.   SW met with pt in room to provide updates from team conference. Pt is aware that he will d/c to ortho on 1/12. Pt informed that there will be follow-up about loaner w/c. Pt aware SW will leave message for his brother.   SW left message for pt brother Quita Skye to provide updates from team conference.   *charity HH referral declined due to over income after they completed eligibility with his brother.    Loralee Pacas, MSW, [place] Office: [phone] Cell: [phone] Fax: [phone]

## 2021-09-27 ENCOUNTER — Other Ambulatory Visit (HOSPITAL_COMMUNITY): Payer: Self-pay

## 2021-09-27 NOTE — Progress Notes (Addendum)
Patient ID: Devin Jones, male   DOB: 16-Feb-1966, 56 y.o.   MRN: [id number]  SW faxed short term disability forms to [employer] (p:[phone]/f:[phone]).   SW met with pt in room to provide original STD forms. Pt also provided loaner w/c from [employer] updated on charity HH decline. Pt will be set up for [facility] for future needs. Pt has no other questions concerns reported.   SW sent transportation enrollment form. Pt set up for [facility] medication assistance program.   Loralee Pacas, MSW, [place] Office: [phone] Cell: [phone] Fax: [phone]

## 2021-09-27 NOTE — Progress Notes (Signed)
Recreational Therapy Session Note  Patient Details  Name: Devin Jones MRN: [id number] Date of Birth: [birth year] Today's Date: 09/27/2021   TR eval deferred as pt is expected to discharge tomorrow.  Karlee Staff 09/27/2021, 3:46 PM

## 2021-09-27 NOTE — Progress Notes (Addendum)
Physical Therapy TBI Note  Patient Details  Name: Manbir Loschiavo MRN: [id number] Date of Birth: 1966-08-23  Today's Date: 09/27/2021 PT Individual Time: 0905-1005 PT Individual Time Calculation (min): 60 min   Short Term Goals: Week 3:  PT Short Term Goal 1 (Week 3): STGs = LTGs  Skilled Therapeutic Interventions/Progress Updates:     Patient in bed upon PT arrival. Patient alert and agreeable to PT session. Patient reported 2/10 lower L quadrant pain x2 during session, RN made aware. PT provided repositioning, rest breaks, and distraction as pain interventions throughout session. Unable to reproduce pain with palpation or muscle contraction, patient denied pain remainder of session.  Focused session on d/c assessment, see Discharge Summary Note, and review of HEP handout provided during session. Discussed likely precautions following planned surgeries on 1/12 for B lower extremities and instructed patient to clarify with ortho team which exercises are no longer appropriate.  Patient reports that he is feeling confident about performing all transfers and w/c mobility at this time, and denied further questions or concerns about mobility at d/c aside from post surgical questions, differed to ortho team at this time.   Therapeutic Exercise: Patient performed the following exercises from HEP handout demonstrating independence with all exercises at this time. Access Code: [email or web address] Supine Ankle Pumps - 10 x daily - 7 x weekly - 2 sets - 10 reps Supine Quad Set - 3 x daily - 7 x weekly - 3 sets - 10 reps - 5 sec hold Supine Gluteal Sets - 3 x daily - 7 x weekly - 3 sets - 10 reps - 5 sec hold Supine Straight Leg Hip Adduction and Quad Set with Ball - 3 x daily - 7 x weekly - 3 sets - 10 reps - 5 sec hold Supine Heel Slides - 3 x daily - 7 x weekly - 3 sets - 10 reps Supine Hip Abduction - 3 x daily - 7 x weekly - 3 sets - 10 reps Seated Knee Flexion Extension AROM - 3 x daily - 7 x weekly -  2 sets - 10 reps Seated Knee Flexion AAROM - 3 x daily - 7 x weekly - 2 sets - 10 reps Seated Long Arc Quad - 3 x daily - 7 x weekly - 2 sets - 10 reps  RLE Assessment RLE Assessment: Exceptions to [facility] General Strength Comments: Grossly 3+ to 4/5 throughout RLE AROM (degrees) Right Hip Extension: 0 Right Hip Flexion: 118 Right Hip ABduction: 40 Right Hip ADduction: 11 Right Knee Extension: -6 Right Knee Flexion: 108 Right Ankle Dorsiflexion: 12 LLE Assessment LLE Assessment: Exceptions to [facility] General Strength Comments: Grossly 3+ to 4/5 throughout LLE AROM (degrees) Left Hip Extension: 0 Left Hip Flexion: 110 Left Hip ABduction: 25 Left Hip ADduction: 12 Left Knee Extension: 0 Left Knee Flexion: 120 Left Ankle Dorsiflexion: 14  Patient in bed at end of session with breaks locked, bed alarm set, and all needs within reach.   Therapy Documentation Precautions:  Precautions Precautions: Fall Precaution Comments: NWB BLE 6-8 weeks, WB through R elbow only Required Braces or Orthoses:  (R knee immobilizer discontinued by ortho MD on 1/3) Other Brace: L bledsoe brace unlocked and donned when OOB Restrictions Weight Bearing Restrictions: (P) Yes RUE Weight Bearing: Weight bear through elbow only RLE Weight Bearing: Non weight bearing LLE Weight Bearing: Non weight bearing Other Position/Activity Restrictions: NO weight bearing on L LE due ot pelvic fractures per ortho, cleared for R knee ROM on  12/22, L knee full ROM in bledsoe brace, cleared for full B hip ROM, cleared for R shoulder ROM as tolerated, no R elbow extension with resistance  Agitated Behavior Scale: TBI Observation Details Observation Environment: pt room Start of observation period - Date: 09/27/21 Start of observation period - Time: 0905 End of observation period - Date: 09/27/21 End of observation period - Time: 1005 Agitated Behavior Scale (DO NOT LEAVE BLANKS) Short attention span, easy  distractibility, inability to concentrate: Absent Impulsive, impatient, low tolerance for pain or frustration: Absent Uncooperative, resistant to care, demanding: Absent Violent and/or threatening violence toward people or property: Absent Explosive and/or unpredictable anger: Absent Rocking, rubbing, moaning, or other self-stimulating behavior: Absent Pulling at tubes, restraints, etc.: Absent Wandering from treatment areas: Absent Restlessness, pacing, excessive movement: Absent Repetitive behaviors, motor, and/or verbal: Absent Rapid, loud, or excessive talking: Absent Sudden changes of mood: Absent Easily initiated or excessive crying and/or laughter: Absent Self-abusiveness, physical and/or verbal: Absent Agitated behavior scale total score: 14    Therapy/Group: Individual Therapy  Yamilka Lopiccolo L Margree Gimbel PT, DPT, CBIS 09/27/2021, 3:42 PM

## 2021-09-27 NOTE — Progress Notes (Signed)
Occupational Therapy TBI Note  Patient Details  Name: Jarvin Cardona MRN: [id number] Date of Birth: 23-Apr-1966  Today's Date: 09/27/2021 OT Individual Time: 1030-1057 OT Individual Time Calculation (min): 27 min    Short Term Goals: Week 3:  OT Short Term Goal 1 (Week 3): LTG=STG 2/2 ELOS  Skilled Therapeutic Interventions/Progress Updates:  Pt greeted supine in bed   pt agreeable to OT intervention. Session focus on functional transfer training for home mgmt and safety. Pt reports he is unsure the height of his new bed at home. Session focus on SB transfer to higher height than 21 inches. Pt completed bed mobility MOD I with HOB elevated. Pt completed SB transfer from EOB>w/c with supervision. Pt self propelled w/c to gym MOD I. Pt practiced SB transfer from w/c>elevated EOM to 23 inches. Pt completed uphill transfer with MIN A to hold w/c steady. Pt unable to transfer to surface any higher than 23 inches. pt reports he and his brother will problem solve how to make sure bed at home is </= to 23 inches. Pt completed w/c propulsion back to room MOD I.  pt left up in w/c with all needs within reach.   Therapy Documentation Precautions:  Precautions Precautions: Fall Precaution Comments: NWB BLE 6-8 weeks, WB through R elbow only Required Braces or Orthoses:  (R knee immobilizer discontinued by ortho MD on 1/3) Other Brace: L bledsoe brace unlocked and donned when OOB Restrictions Weight Bearing Restrictions: (P) Yes RUE Weight Bearing: Weight bear through elbow only RLE Weight Bearing: Non weight bearing LLE Weight Bearing: Non weight bearing Other Position/Activity Restrictions: NO weight bearing on L LE due ot pelvic fractures per ortho, cleared for R knee ROM on 12/22, L knee full ROM in bledsoe brace, cleared for full B hip ROM, cleared for R shoulder ROM as tolerated, no R elbow extension with resistance    Pain: no pain reported during session    Agitated Behavior  Scale: TBI Observation Details Observation Environment: CIR Start of observation period - Date: 09/27/21 Start of observation period - Time: 1030 End of observation period - Date: 09/27/21 End of observation period - Time: 1057 Agitated Behavior Scale (DO NOT LEAVE BLANKS) Short attention span, easy distractibility, inability to concentrate: Absent Impulsive, impatient, low tolerance for pain or frustration: Absent Uncooperative, resistant to care, demanding: Absent Violent and/or threatening violence toward people or property: Absent Explosive and/or unpredictable anger: Absent Rocking, rubbing, moaning, or other self-stimulating behavior: Absent Pulling at tubes, restraints, etc.: Absent Wandering from treatment areas: Absent Restlessness, pacing, excessive movement: Absent Repetitive behaviors, motor, and/or verbal: Absent Rapid, loud, or excessive talking: Absent Sudden changes of mood: Absent Easily initiated or excessive crying and/or laughter: Absent Self-abusiveness, physical and/or verbal: Absent Agitated behavior scale total score: 14     Therapy/Group: Individual Therapy  Corinne Ports [facility] 09/27/2021, 12:23 PM

## 2021-09-27 NOTE — Progress Notes (Signed)
Occupational Therapy Session Note  Patient Details  Name: Devin Jones MRN: [id number] Date of Birth: 12/21/1965  Today's Date: 09/27/2021 OT Individual Time: 1335-1430 OT Individual Time Calculation (min): 55 min    Short Term Goals: Week 3:  OT Short Term Goal 1 (Week 3): LTG=STG 2/2 ELOS  Skilled Therapeutic Interventions/Progress Updates:    Pt received supine with no c/o pain. Session focused on d/c planning, problem solving through home barriers, OT recommendations for safe ADL/IADl routine at home, and DME management. Pt had new loaner chair in room, so reviewed all functions of chair with him. He had several questions re measurements and features of chair- all were answered thoroughly, as well as demonstration re ROHO management, use of brake extenders, and leg rest removal. Pt was able to return demonstration re use of all the features with (S). He completed a slideboard transfer to/from the w/c <>bed with supervision. Extensive time spent discussing transfer to bed at home, [facility], navigating through doorways, and use of additional equipment to increase independence at w/c level (I.e. faucet extender, options for doorway widening, problem solving through changing bed height). Pt was left supine with all needs met.   Therapy Documentation Precautions:  Precautions Precautions: Fall Precaution Comments: NWB BLE 6-8 weeks, WB through R elbow only Required Braces or Orthoses:  (R knee immobilizer discontinued by ortho MD on 1/3) Other Brace: L bledsoe brace unlocked and donned when OOB Restrictions Weight Bearing Restrictions: (P) Yes RUE Weight Bearing: Weight bearing as tolerated RLE Weight Bearing: Non weight bearing LLE Weight Bearing: Non weight bearing Other Position/Activity Restrictions: NO weight bearing on L LE due ot pelvic fractures per ortho, cleared for R knee ROM on 12/22, L knee full ROM in bledsoe brace, cleared for full B hip ROM, cleared for R shoulder ROM as  tolerated, no R elbow extension with resistance   Therapy/Group: Individual Therapy  Curtis Sites 09/27/2021, 6:39 AM

## 2021-09-27 NOTE — Progress Notes (Signed)
Physical Therapy Session Note  Patient Details  Name: Devin Jones MRN: [id number] Date of Birth: 01-15-1966  Today's Date: 09/27/2021 PT Individual Time: 4784-1282 PT Individual Time Calculation (min): 53 min   Short Term Goals:  Week 3:  PT Short Term Goal 1 (Week 3): STGs = LTGs   Skilled Therapeutic Interventions/Progress Updates:   Pt received supine in bed and agreeable to PT. Supine>sit transfer without assist or cues. Slide board transfer to [facility] with set up assist for position of WC and arm rest management on [employer] custom loaner WC.   WC mobility with supervision assist progressing to mod I with loaner WC from [employer] through hall, atrium, elevator and ove cement sidewalk. Cues for safety on unlevel surface initially, then noted improvement with increased repetitions.   AROM LAQ, Ankle pumps 2 x 10 bil with cues for full ROM as tolerated and therapeutic rest break between bouts.    Patient returned to room and left sitting in [facility] with call bell in reach and all needs met.        Therapy Documentation Precautions:  Precautions Precautions: Fall Precaution Comments: NWB BLE 6-8 weeks, WB through R elbow only Required Braces or Orthoses:  (R knee immobilizer discontinued by ortho MD on 1/3) Other Brace: L bledsoe brace unlocked and donned when OOB Restrictions Weight Bearing Restrictions: (P) Yes RUE Weight Bearing: Weight bear through elbow only RLE Weight Bearing: Non weight bearing LLE Weight Bearing: Non weight bearing Other Position/Activity Restrictions: NO weight bearing on L LE due ot pelvic fractures per ortho, cleared for R knee ROM on 12/22, L knee full ROM in bledsoe brace, cleared for full B hip ROM, cleared for R shoulder ROM as tolerated, no R elbow extension with resistance    Pain: Pain Assessment Pain Scale: 0-10 Pain Score: 0-No pain    Therapy/Group: Individual Therapy  Lorie Phenix 09/27/2021, 11:32 PM

## 2021-09-28 ENCOUNTER — Encounter (HOSPITAL_COMMUNITY)
Admission: RE | Disposition: A | Discharge status: Home / Self Care | Payer: Self-pay | Source: Intra-hospital | Attending: Physical Medicine & Rehabilitation

## 2021-09-28 ENCOUNTER — Inpatient Hospital Stay (HOSPITAL_COMMUNITY): Payer: Medicaid Other | Admitting: Anesthesiology

## 2021-09-28 ENCOUNTER — Inpatient Hospital Stay (HOSPITAL_COMMUNITY)
Admission: RE | Admit: 2021-09-28 | Discharge: 2021-10-02 | DRG: 489 | Disposition: A | Discharge status: Home / Self Care | Payer: Medicaid Other | Source: Ambulatory Visit | Attending: Orthopedic Surgery | Admitting: Orthopedic Surgery

## 2021-09-28 ENCOUNTER — Inpatient Hospital Stay (HOSPITAL_COMMUNITY): Payer: Medicaid Other

## 2021-09-28 ENCOUNTER — Encounter (HOSPITAL_COMMUNITY): Payer: Self-pay | Admitting: Orthopedic Surgery

## 2021-09-28 ENCOUNTER — Inpatient Hospital Stay (HOSPITAL_COMMUNITY): Payer: Medicaid Other | Admitting: Certified Registered Nurse Anesthetist

## 2021-09-28 ENCOUNTER — Encounter (HOSPITAL_COMMUNITY)
Admission: RE | Disposition: A | Discharge status: Home / Self Care | Payer: Self-pay | Source: Ambulatory Visit | Attending: Orthopedic Surgery

## 2021-09-28 DIAGNOSIS — Z800 Family history of malignant neoplasm of digestive organs: Secondary | ICD-10-CM | POA: Diagnosis not present

## 2021-09-28 DIAGNOSIS — Z841 Family history of disorders of kidney and ureter: Secondary | ICD-10-CM | POA: Diagnosis not present

## 2021-09-28 DIAGNOSIS — Z419 Encounter for procedure for purposes other than remedying health state, unspecified: Secondary | ICD-10-CM

## 2021-09-28 DIAGNOSIS — S8992XD Unspecified injury of left lower leg, subsequent encounter: Secondary | ICD-10-CM

## 2021-09-28 DIAGNOSIS — Z969 Presence of functional implant, unspecified: Secondary | ICD-10-CM

## 2021-09-28 DIAGNOSIS — S83521D Sprain of posterior cruciate ligament of right knee, subsequent encounter: Secondary | ICD-10-CM | POA: Diagnosis not present

## 2021-09-28 DIAGNOSIS — S83511D Sprain of anterior cruciate ligament of right knee, subsequent encounter: Secondary | ICD-10-CM

## 2021-09-28 DIAGNOSIS — S83282A Other tear of lateral meniscus, current injury, left knee, initial encounter: Secondary | ICD-10-CM | POA: Diagnosis present

## 2021-09-28 DIAGNOSIS — M25561 Pain in right knee: Secondary | ICD-10-CM | POA: Diagnosis present

## 2021-09-28 DIAGNOSIS — S83511A Sprain of anterior cruciate ligament of right knee, initial encounter: Principal | ICD-10-CM

## 2021-09-28 DIAGNOSIS — Z823 Family history of stroke: Secondary | ICD-10-CM | POA: Diagnosis not present

## 2021-09-28 DIAGNOSIS — Z8249 Family history of ischemic heart disease and other diseases of the circulatory system: Secondary | ICD-10-CM | POA: Diagnosis not present

## 2021-09-28 DIAGNOSIS — S83422D Sprain of lateral collateral ligament of left knee, subsequent encounter: Secondary | ICD-10-CM

## 2021-09-28 DIAGNOSIS — S83521A Sprain of posterior cruciate ligament of right knee, initial encounter: Secondary | ICD-10-CM

## 2021-09-28 DIAGNOSIS — S83272D Complex tear of lateral meniscus, current injury, left knee, subsequent encounter: Secondary | ICD-10-CM

## 2021-09-28 DIAGNOSIS — S83272A Complex tear of lateral meniscus, current injury, left knee, initial encounter: Secondary | ICD-10-CM

## 2021-09-28 DIAGNOSIS — Z9889 Other specified postprocedural states: Secondary | ICD-10-CM

## 2021-09-28 HISTORY — PX: ANTERIOR CRUCIATE LIGAMENT REPAIR: SHX115

## 2021-09-28 HISTORY — PX: HARDWARE REMOVAL: SHX979

## 2021-09-28 LAB — CBC
HCT: 42 % (ref 39.0–52.0)
Hemoglobin: 13.2 g/dL (ref 13.0–17.0)
MCH: 29.6 pg (ref 26.0–34.0)
MCHC: 31.4 g/dL (ref 30.0–36.0)
MCV: 94.2 fL (ref 80.0–100.0)
Platelets: 253 10*3/uL (ref 150–400)
RBC: 4.46 MIL/uL (ref 4.22–5.81)
RDW: 14.2 % (ref 11.5–15.5)
WBC: 9.6 10*3/uL (ref 4.0–10.5)
nRBC: 0 % (ref 0.0–0.2)

## 2021-09-28 LAB — CREATININE, SERUM
Creatinine, Ser: 0.98 mg/dL (ref 0.61–1.24)
GFR, Estimated: >60 mL/min (ref >60)

## 2021-09-28 IMAGING — RF DG KNEE 1-2V*R*
1 series · 3 of 3 positions shown · non-contrast
Comparison: None.

CLINICAL DATA: Fluoroscopic assistance for ACL repair

EXAM:
RIGHT KNEE - 1-2 VIEW

[Series 1: unknown protocol · 0.14mm/px · 3 of 3 slices shown]
[im 1/3]
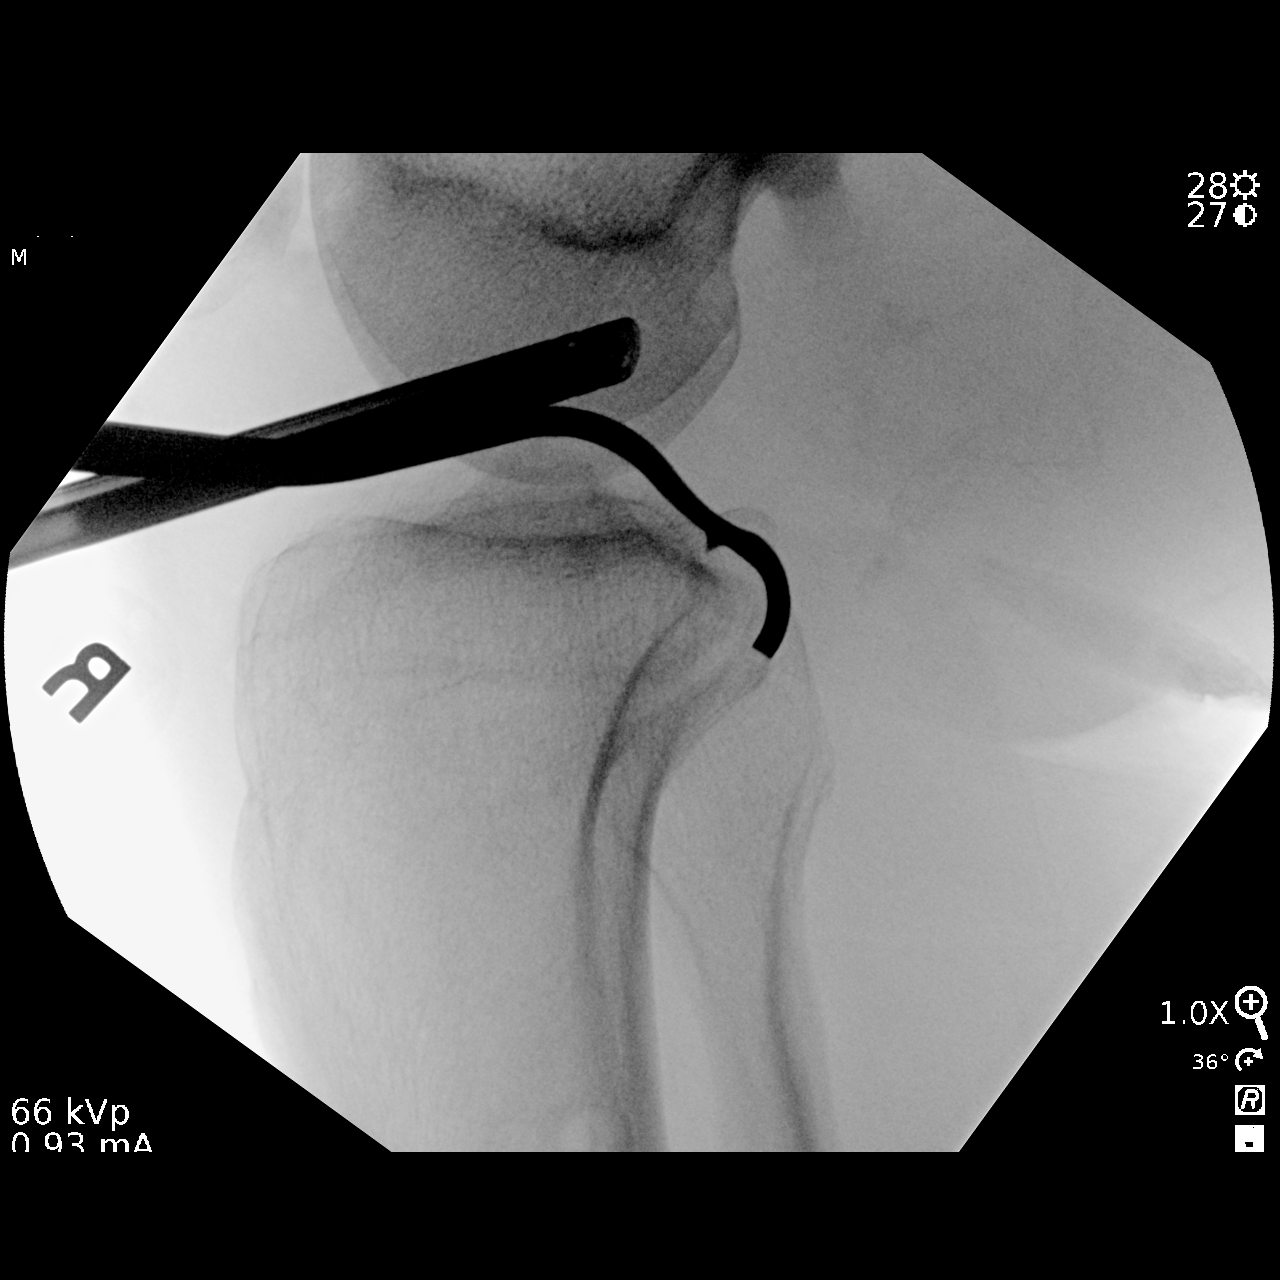
[im 2/3]
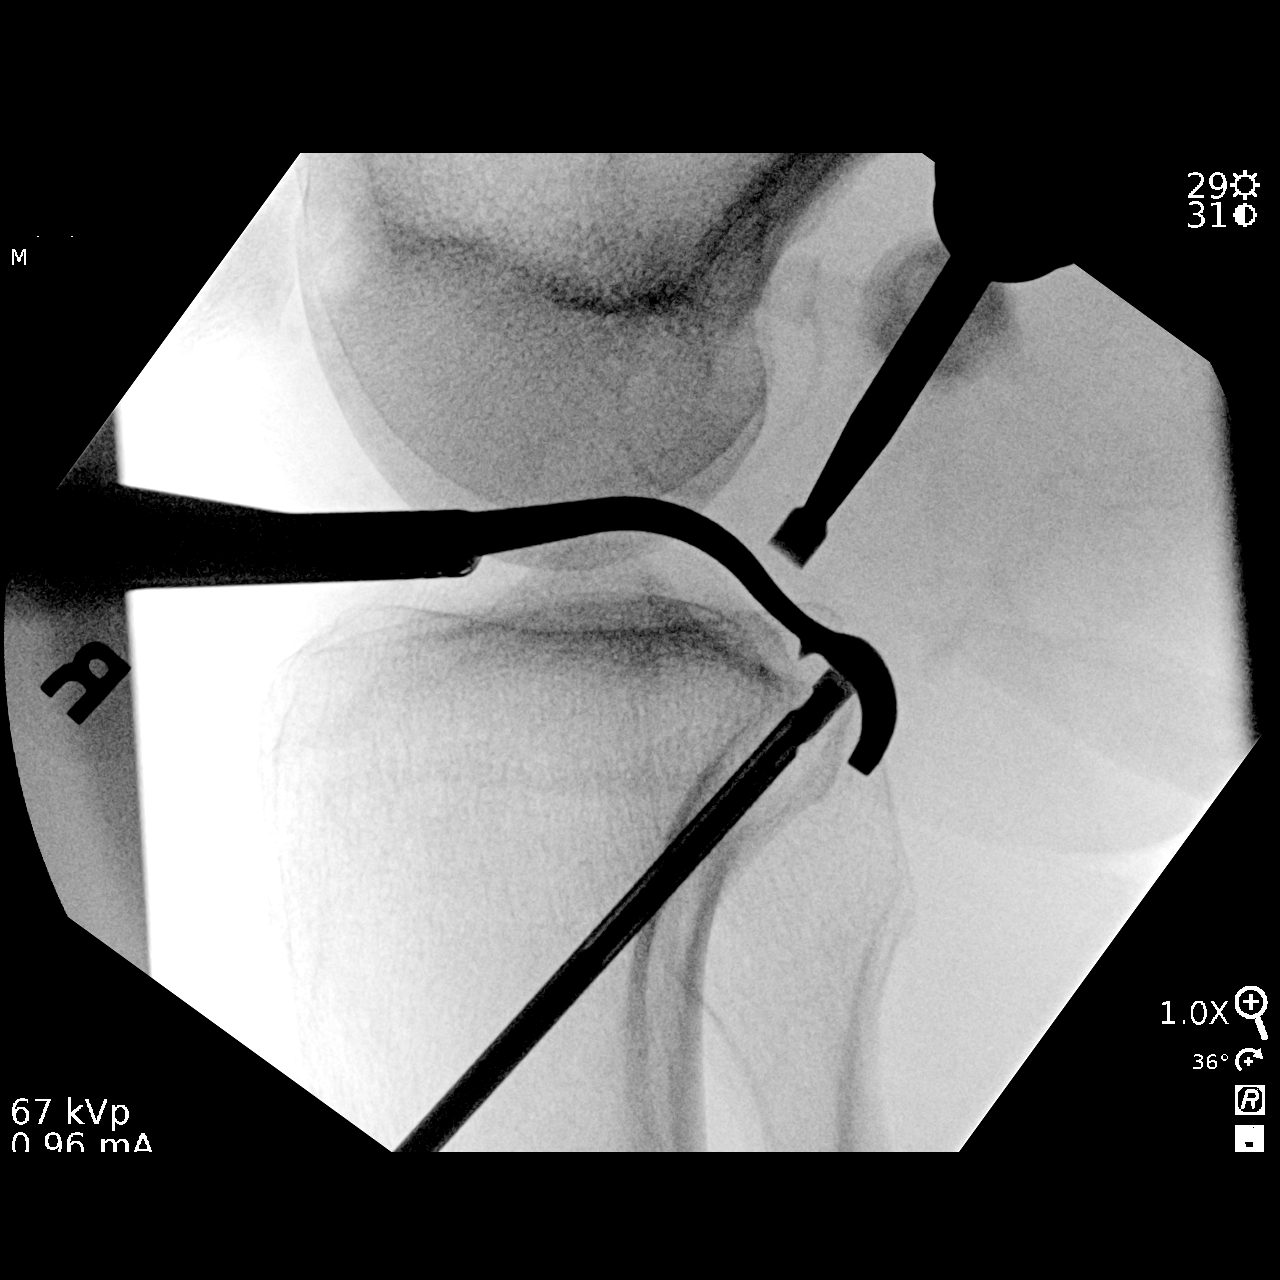
[im 3/3]
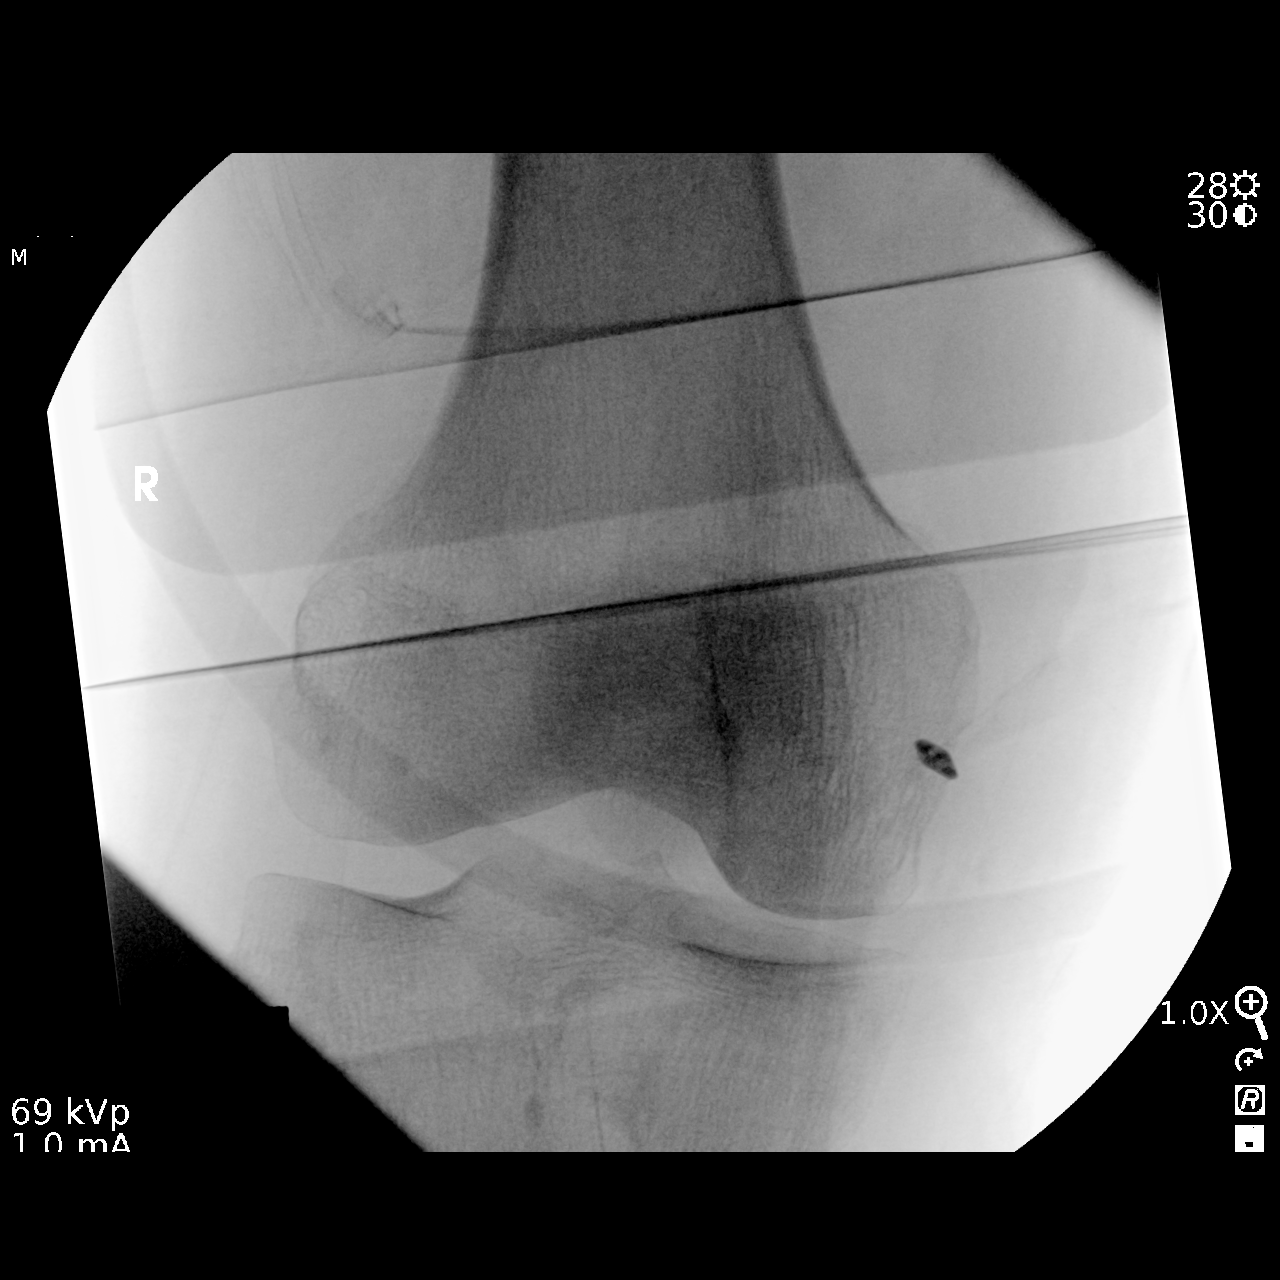

[3 of 3 positions shown; findings below may reference images not displayed]

FINDINGS: Fluoroscopic images show a metallic anchor in the distal femur,
possibly used for cruciate ligament repair. Fluoroscopic time is 46
seconds. Radiation dose is 1.67 mGy.
IMPRESSION: Fluoroscopic assistance was provided for cruciate ligament repair.

## 2021-09-28 SURGERY — RECONSTRUCTION, KNEE, ACL, USING HAMSTRING GRAFT
Anesthesia: Regional | Site: Knee | Laterality: Right

## 2021-09-28 SURGERY — REPAIR, KNEE, ACL
Anesthesia: Regional | Site: Wrist | Laterality: Right

## 2021-09-28 MED ORDER — ONDANSETRON HCL 4 MG PO TABS: 4.0000 mg | ORAL_TABLET | Freq: Four times a day (QID) | ORAL | Status: DC | PRN

## 2021-09-28 MED ORDER — EPINEPHRINE PF 1 MG/ML IJ SOLN
INTRAMUSCULAR | Status: DC | PRN
  Administered 2021-09-28 (×4): 1 mg

## 2021-09-28 MED ORDER — LIDOCAINE 2% (20 MG/ML) 5 ML SYRINGE
INTRAMUSCULAR | Status: AC
  Filled 2021-09-28: qty 5

## 2021-09-28 MED ORDER — ONDANSETRON HCL 4 MG/2ML IJ SOLN
INTRAMUSCULAR | Status: AC
  Filled 2021-09-28: qty 2

## 2021-09-28 MED ORDER — EPINEPHRINE PF 1 MG/ML IJ SOLN
INTRAMUSCULAR | Status: AC
  Filled 2021-09-28: qty 2

## 2021-09-28 MED ORDER — HYDROMORPHONE HCL 1 MG/ML IJ SOLN: 0.2500 mg | INTRAMUSCULAR | Status: DC | PRN

## 2021-09-28 MED ORDER — ONDANSETRON HCL 4 MG/2ML IJ SOLN: 4.0000 mg | Freq: Four times a day (QID) | INTRAMUSCULAR | Status: DC | PRN

## 2021-09-28 MED ORDER — PROPOFOL 500 MG/50ML IV EMUL
INTRAVENOUS | Status: DC | PRN
  Administered 2021-09-28: 25 ug/kg/min via INTRAVENOUS

## 2021-09-28 MED ORDER — LACTATED RINGERS IV SOLN: INTRAVENOUS | Status: DC

## 2021-09-28 MED ORDER — MORPHINE SULFATE (PF) 4 MG/ML IV SOLN
INTRAVENOUS | Status: DC | PRN
  Administered 2021-09-28: 8 mg via SUBCUTANEOUS

## 2021-09-28 MED ORDER — CHLORHEXIDINE GLUCONATE 0.12 % MT SOLN
15.0000 mL | Freq: Once | OROMUCOSAL | Status: AC
  Administered 2021-09-28: 15 mL via OROMUCOSAL

## 2021-09-28 MED ORDER — FENTANYL CITRATE (PF) 100 MCG/2ML IJ SOLN
25.0000 ug | INTRAMUSCULAR | Status: DC | PRN
  Administered 2021-09-28 (×3): 50 ug via INTRAVENOUS

## 2021-09-28 MED ORDER — POVIDONE-IODINE 10 % EX SWAB: 2.0000 "application " | Freq: Once | CUTANEOUS | Status: DC

## 2021-09-28 MED ORDER — CHLORHEXIDINE GLUCONATE 0.12 % MT SOLN
OROMUCOSAL | Status: AC
  Filled 2021-09-28: qty 15

## 2021-09-28 MED ORDER — ARTIFICIAL TEARS OPHTHALMIC OINT
TOPICAL_OINTMENT | OPHTHALMIC | Status: DC | PRN
  Administered 2021-09-28: 1 via OPHTHALMIC

## 2021-09-28 MED ORDER — METOCLOPRAMIDE HCL 5 MG PO TABS: 5.0000 mg | ORAL_TABLET | Freq: Three times a day (TID) | ORAL | Status: DC | PRN

## 2021-09-28 MED ORDER — ACETAMINOPHEN 325 MG PO TABS
325.0000 mg | ORAL_TABLET | Freq: Four times a day (QID) | ORAL | Status: DC | PRN
  Administered 2021-09-30 – 2021-10-02 (×3): 650 mg via ORAL
  Filled 2021-09-28 (×3): qty 2

## 2021-09-28 MED ORDER — CLONIDINE HCL (ANALGESIA) 100 MCG/ML EP SOLN
EPIDURAL | Status: AC
  Filled 2021-09-28: qty 10

## 2021-09-28 MED ORDER — FENTANYL CITRATE (PF) 100 MCG/2ML IJ SOLN: 50.0000 ug | Freq: Once | INTRAMUSCULAR | Status: AC

## 2021-09-28 MED ORDER — BUPIVACAINE-EPINEPHRINE (PF) 0.25% -1:200000 IJ SOLN
INTRAMUSCULAR | Status: AC
  Filled 2021-09-28: qty 30

## 2021-09-28 MED ORDER — PHENYLEPHRINE 40 MCG/ML (10ML) SYRINGE FOR IV PUSH (FOR BLOOD PRESSURE SUPPORT): PREFILLED_SYRINGE | INTRAVENOUS | Status: DC | PRN

## 2021-09-28 MED ORDER — ENOXAPARIN SODIUM 40 MG/0.4ML IJ SOSY
40.0000 mg | PREFILLED_SYRINGE | INTRAMUSCULAR | Status: DC
  Administered 2021-09-29 – 2021-10-02 (×4): 40 mg via SUBCUTANEOUS
  Filled 2021-09-28 (×4): qty 0.4

## 2021-09-28 MED ORDER — BUPIVACAINE HCL (PF) 0.25 % IJ SOLN
INTRAMUSCULAR | Status: DC | PRN
  Administered 2021-09-28: 30 mL via INTRA_ARTICULAR

## 2021-09-28 MED ORDER — VANCOMYCIN HCL 1000 MG IV SOLR
INTRAVENOUS | Status: AC
  Filled 2021-09-28: qty 20

## 2021-09-28 MED ORDER — CEFAZOLIN SODIUM 1 G IJ SOLR
INTRAMUSCULAR | Status: AC
  Filled 2021-09-28: qty 20

## 2021-09-28 MED ORDER — BUPIVACAINE HCL (PF) 0.5 % IJ SOLN
INTRAMUSCULAR | Status: DC | PRN
  Administered 2021-09-28: 30 mL via PERINEURAL

## 2021-09-28 MED ORDER — OXYCODONE HCL 5 MG PO TABS
5.0000 mg | ORAL_TABLET | ORAL | Status: DC | PRN
  Administered 2021-09-28 – 2021-09-29 (×7): 10 mg via ORAL
  Filled 2021-09-28 (×10): qty 2

## 2021-09-28 MED ORDER — OXYCODONE HCL 5 MG/5ML PO SOLN: 5.0000 mg | Freq: Once | ORAL | Status: DC | PRN

## 2021-09-28 MED ORDER — ROCURONIUM BROMIDE 10 MG/ML (PF) SYRINGE
PREFILLED_SYRINGE | INTRAVENOUS | Status: DC | PRN
Start: 2021-09-28 — End: 2021-09-28
  Administered 2021-09-28: 70 mg via INTRAVENOUS
  Administered 2021-09-28: 50 mg via INTRAVENOUS
  Administered 2021-09-28 (×2): 30 mg via INTRAVENOUS
  Administered 2021-09-28: 50 mg via INTRAVENOUS
  Administered 2021-09-28: 30 mg via INTRAVENOUS
  Administered 2021-09-28: 20 mg via INTRAVENOUS

## 2021-09-28 MED ORDER — DOCUSATE SODIUM 100 MG PO CAPS
100.0000 mg | ORAL_CAPSULE | Freq: Two times a day (BID) | ORAL | Status: DC
  Administered 2021-09-28 – 2021-10-02 (×7): 100 mg via ORAL
  Filled 2021-09-28 (×8): qty 1

## 2021-09-28 MED ORDER — AMISULPRIDE (ANTIEMETIC) 5 MG/2ML IV SOLN: 10.0000 mg | Freq: Once | INTRAVENOUS | Status: DC | PRN

## 2021-09-28 MED ORDER — SUGAMMADEX SODIUM 200 MG/2ML IV SOLN
INTRAVENOUS | Status: DC | PRN
  Administered 2021-09-28: 200 mg via INTRAVENOUS

## 2021-09-28 MED ORDER — ACETAMINOPHEN 325 MG PO TABS: 325.0000 mg | ORAL_TABLET | Freq: Four times a day (QID) | ORAL | Status: DC | PRN

## 2021-09-28 MED ORDER — BUPIVACAINE HCL (PF) 0.25 % IJ SOLN
INTRAMUSCULAR | Status: AC
  Filled 2021-09-28: qty 30

## 2021-09-28 MED ORDER — LIDOCAINE 2% (20 MG/ML) 5 ML SYRINGE
INTRAMUSCULAR | Status: DC | PRN
Start: 2021-09-28 — End: 2021-09-28
  Administered 2021-09-28: 80 mg via INTRAVENOUS

## 2021-09-28 MED ORDER — DEXAMETHASONE SODIUM PHOSPHATE 10 MG/ML IJ SOLN
INTRAMUSCULAR | Status: AC
  Filled 2021-09-28: qty 1

## 2021-09-28 MED ORDER — CHLORHEXIDINE GLUCONATE 4 % EX LIQD: 60.0000 mL | Freq: Once | CUTANEOUS | Status: DC

## 2021-09-28 MED ORDER — BUPIVACAINE-EPINEPHRINE (PF) 0.25% -1:200000 IJ SOLN
INTRAMUSCULAR | Status: DC | PRN
Start: 2021-09-28 — End: 2021-09-28
  Administered 2021-09-28: 15 mL

## 2021-09-28 MED ORDER — METOCLOPRAMIDE HCL 5 MG/ML IJ SOLN: 5.0000 mg | Freq: Three times a day (TID) | INTRAMUSCULAR | Status: DC | PRN

## 2021-09-28 MED ORDER — ARTIFICIAL TEARS OPHTHALMIC OINT
TOPICAL_OINTMENT | OPHTHALMIC | Status: AC
  Filled 2021-09-28: qty 3.5

## 2021-09-28 MED ORDER — TRANEXAMIC ACID-NACL 1000-0.7 MG/100ML-% IV SOLN
INTRAVENOUS | Status: AC
  Filled 2021-09-28: qty 100

## 2021-09-28 MED ORDER — 0.9 % SODIUM CHLORIDE (POUR BTL) OPTIME
TOPICAL | Status: DC | PRN
Start: 2021-09-28 — End: 2021-09-28
  Administered 2021-09-28: 1000 mL

## 2021-09-28 MED ORDER — FENTANYL CITRATE (PF) 250 MCG/5ML IJ SOLN
INTRAMUSCULAR | Status: AC
  Filled 2021-09-28: qty 5

## 2021-09-28 MED ORDER — METHOCARBAMOL 500 MG PO TABS
500.0000 mg | ORAL_TABLET | Freq: Four times a day (QID) | ORAL | Status: DC | PRN
  Administered 2021-09-28 – 2021-10-01 (×6): 500 mg via ORAL
  Filled 2021-09-28 (×7): qty 1

## 2021-09-28 MED ORDER — TRANEXAMIC ACID-NACL 1000-0.7 MG/100ML-% IV SOLN
INTRAVENOUS | Status: DC | PRN
Start: 2021-09-28 — End: 2021-09-28
  Administered 2021-09-28: 1000 mg via INTRAVENOUS

## 2021-09-28 MED ORDER — ROCURONIUM BROMIDE 10 MG/ML (PF) SYRINGE
PREFILLED_SYRINGE | INTRAVENOUS | Status: AC
  Filled 2021-09-28: qty 30

## 2021-09-28 MED ORDER — FENTANYL CITRATE (PF) 100 MCG/2ML IJ SOLN
INTRAMUSCULAR | Status: AC
  Filled 2021-09-28: qty 2

## 2021-09-28 MED ORDER — FENTANYL CITRATE (PF) 100 MCG/2ML IJ SOLN
INTRAMUSCULAR | Status: AC
  Administered 2021-09-28: 50 ug via INTRAVENOUS
  Filled 2021-09-28: qty 2

## 2021-09-28 MED ORDER — PROPOFOL 10 MG/ML IV BOLUS
INTRAVENOUS | Status: DC | PRN
Start: 2021-09-28 — End: 2021-09-28
  Administered 2021-09-28: 40 mg via INTRAVENOUS
  Administered 2021-09-28: 160 mg via INTRAVENOUS

## 2021-09-28 MED ORDER — ORAL CARE MOUTH RINSE: 15.0000 mL | Freq: Once | OROMUCOSAL | Status: AC

## 2021-09-28 MED ORDER — PROMETHAZINE HCL 25 MG/ML IJ SOLN: 6.2500 mg | INTRAMUSCULAR | Status: DC | PRN

## 2021-09-28 MED ORDER — SODIUM CHLORIDE 0.9 % IR SOLN
Status: DC | PRN
  Administered 2021-09-28: 3000 mL
  Administered 2021-09-28 (×2): 6000 mL
  Administered 2021-09-28: 3000 mL
  Administered 2021-09-28 (×3): 6000 mL
  Administered 2021-09-28 (×4): 3000 mL

## 2021-09-28 MED ORDER — ONDANSETRON HCL 4 MG/2ML IJ SOLN
INTRAMUSCULAR | Status: DC | PRN
  Administered 2021-09-28 (×2): 4 mg via INTRAVENOUS

## 2021-09-28 MED ORDER — DEXAMETHASONE SODIUM PHOSPHATE 10 MG/ML IJ SOLN
INTRAMUSCULAR | Status: DC | PRN
Start: 2021-09-28 — End: 2021-09-28
  Administered 2021-09-28: 10 mg via INTRAVENOUS

## 2021-09-28 MED ORDER — CEFAZOLIN SODIUM-DEXTROSE 2-4 GM/100ML-% IV SOLN
INTRAVENOUS | Status: AC
  Filled 2021-09-28: qty 100

## 2021-09-28 MED ORDER — MORPHINE SULFATE (PF) 4 MG/ML IV SOLN
INTRAVENOUS | Status: AC
  Filled 2021-09-28: qty 2

## 2021-09-28 MED ORDER — CEFAZOLIN SODIUM-DEXTROSE 2-3 GM-%(50ML) IV SOLR
INTRAVENOUS | Status: DC | PRN
Start: 2021-09-28 — End: 2021-09-28
  Administered 2021-09-28 (×2): 2 g via INTRAVENOUS

## 2021-09-28 MED ORDER — ACETAMINOPHEN 500 MG PO TABS
1000.0000 mg | ORAL_TABLET | Freq: Four times a day (QID) | ORAL | Status: AC
  Administered 2021-09-29 (×4): 1000 mg via ORAL
  Filled 2021-09-28 (×4): qty 2

## 2021-09-28 MED ORDER — CEFAZOLIN SODIUM-DEXTROSE 2-4 GM/100ML-% IV SOLN
2.0000 g | INTRAVENOUS | Status: DC
  Filled 2021-09-28: qty 100

## 2021-09-28 MED ORDER — CEFAZOLIN SODIUM-DEXTROSE 2-4 GM/100ML-% IV SOLN
2.0000 g | Freq: Three times a day (TID) | INTRAVENOUS | Status: AC
  Administered 2021-09-29 – 2021-09-30 (×6): 2 g via INTRAVENOUS
  Filled 2021-09-28 (×6): qty 100

## 2021-09-28 MED ORDER — VANCOMYCIN HCL 1000 MG IV SOLR
INTRAVENOUS | Status: DC | PRN
  Administered 2021-09-28: 1000 mg

## 2021-09-28 MED ORDER — CLONIDINE HCL (ANALGESIA) 100 MCG/ML EP SOLN
EPIDURAL | Status: DC | PRN
  Administered 2021-09-28: 1 mL via INTRA_ARTICULAR

## 2021-09-28 MED ORDER — OXYCODONE HCL 5 MG PO TABS: 5.0000 mg | ORAL_TABLET | Freq: Once | ORAL | Status: DC | PRN

## 2021-09-28 MED ORDER — PHENYLEPHRINE HCL-NACL 20-0.9 MG/250ML-% IV SOLN
INTRAVENOUS | Status: DC | PRN
  Administered 2021-09-28: 20 ug/min via INTRAVENOUS

## 2021-09-28 MED ORDER — MIDAZOLAM HCL 2 MG/2ML IJ SOLN
INTRAMUSCULAR | Status: AC
  Filled 2021-09-28: qty 2

## 2021-09-28 MED ORDER — DEXTROSE 5 % IV SOLN
500.0000 mg | Freq: Four times a day (QID) | INTRAVENOUS | Status: DC | PRN
  Filled 2021-09-28: qty 5

## 2021-09-28 MED ORDER — FENTANYL CITRATE (PF) 250 MCG/5ML IJ SOLN
INTRAMUSCULAR | Status: DC | PRN
  Administered 2021-09-28 (×7): 50 ug via INTRAVENOUS

## 2021-09-28 MED ORDER — HYDROMORPHONE HCL 1 MG/ML IJ SOLN: 0.5000 mg | INTRAMUSCULAR | Status: DC | PRN

## 2021-09-28 SURGICAL SUPPLY — 98 items
ALCOHOL 70% 16 OZ (MISCELLANEOUS) ×4 IMPLANT
ANCHOR BUTTON TIGHTROPE II FT (Anchor) ×1 IMPLANT
ANCHOR BUTTON TIGHTROPE II OP (Anchor) ×1 IMPLANT
ANCHOR PEEK 4.75X19.1 SWLK C (Anchor) ×1 IMPLANT
ANCHOR SUT BIO SW 4.75X19.1 (Anchor) ×3 IMPLANT
ANCHOR SUT SWIVELLOK BIO (Anchor) ×1 IMPLANT
BAG COUNTER SPONGE SURGICOUNT (BAG) ×4 IMPLANT
BANDAGE ESMARK 6X9 LF (GAUZE/BANDAGES/DRESSINGS) IMPLANT
BLADE CLIPPER SURG (BLADE) ×1 IMPLANT
BLADE CUTTER GATOR 3.5 (BLADE) ×4 IMPLANT
BNDG ELASTIC 6X15 VLCR STRL LF (GAUZE/BANDAGES/DRESSINGS) ×2 IMPLANT
BNDG ELASTIC 6X5.8 VLCR STR LF (GAUZE/BANDAGES/DRESSINGS) IMPLANT
BNDG ESMARK 6X9 LF (GAUZE/BANDAGES/DRESSINGS)
CANNULA 5.75X71 LONG (CANNULA) ×1 IMPLANT
CNTNR URN SCR LID CUP LEK RST (MISCELLANEOUS) IMPLANT
CONT SPEC 4OZ STRL OR WHT (MISCELLANEOUS) ×1
COVER SURGICAL LIGHT HANDLE (MISCELLANEOUS) ×5 IMPLANT
CUFF TOURN SGL QUICK 34 (TOURNIQUET CUFF)
CUFF TOURN SGL QUICK 42 (TOURNIQUET CUFF) IMPLANT
CUFF TRNQT CYL 34X4.125X (TOURNIQUET CUFF) IMPLANT
DRAPE ARTHROSCOPY W/POUCH 114 (DRAPES) ×4 IMPLANT
DRAPE C-ARM 35X43 STRL (DRAPES) ×1 IMPLANT
DRAPE HALF SHEET 40X57 (DRAPES) IMPLANT
DRAPE INCISE IOBAN 66X45 STRL (DRAPES) IMPLANT
DRAPE STERI 35X30 U-POUCH (DRAPES) ×1 IMPLANT
DRAPE U-SHAPE 47X51 STRL (DRAPES) ×4 IMPLANT
DRILL FLIPCUTTER III 6-12 (ORTHOPEDIC DISPOSABLE SUPPLIES) IMPLANT
DRSG AQUACEL AG ADV 3.5X10 (GAUZE/BANDAGES/DRESSINGS) ×1 IMPLANT
DRSG TEGADERM 2-3/8X2-3/4 SM (GAUZE/BANDAGES/DRESSINGS) ×3 IMPLANT
DRSG TEGADERM 4X4.75 (GAUZE/BANDAGES/DRESSINGS) ×5 IMPLANT
DURAPREP 26ML APPLICATOR (WOUND CARE) ×4 IMPLANT
FIBERLOOP 2 0 (SUTURE) ×3 IMPLANT
FIBERSTICK 2 (SUTURE) ×2 IMPLANT
FLIPCUTTER III 6-12 AR-1204FF (ORTHOPEDIC DISPOSABLE SUPPLIES) ×4
GAUZE SPONGE 4X4 12PLY STRL (GAUZE/BANDAGES/DRESSINGS) ×2 IMPLANT
GAUZE XEROFORM 1X8 LF (GAUZE/BANDAGES/DRESSINGS) IMPLANT
GAUZE XEROFORM 5X9 LF (GAUZE/BANDAGES/DRESSINGS) ×1 IMPLANT
GLOVE SRG 8 PF TXTR STRL LF DI (GLOVE) ×3 IMPLANT
GLOVE SURG LTX SZ7 (GLOVE) ×4 IMPLANT
GLOVE SURG LTX SZ8 (GLOVE) ×4 IMPLANT
GLOVE SURG UNDER POLY LF SZ7 (GLOVE) ×4 IMPLANT
GLOVE SURG UNDER POLY LF SZ8 (GLOVE) ×1
GOWN STRL REUS W/ TWL LRG LVL3 (GOWN DISPOSABLE) ×6 IMPLANT
GOWN STRL REUS W/ TWL XL LVL3 (GOWN DISPOSABLE) ×3 IMPLANT
GOWN STRL REUS W/TWL LRG LVL3 (GOWN DISPOSABLE) ×2
GOWN STRL REUS W/TWL XL LVL3 (GOWN DISPOSABLE) ×1
GRAFT TIBIALIS TENDON (Graft) ×4 IMPLANT
GRAFT TISS 65-80 FRZN TENDON (Tissue) IMPLANT
GRAFT TISS SEMITEND 4-8.5 (Graft) IMPLANT
IMP SYS 2ND FIX PEEK 4.75X19.1 (Miscellaneous) ×4 IMPLANT
IMPL SCREW BIO 8X30 (Screw) IMPLANT
IMPL SYS 2ND FX PEEK 4.75X19.1 (Miscellaneous) IMPLANT
IMPLANT SCREW BIO 8X30 (Screw) ×4 IMPLANT
KIT BASIN OR (CUSTOM PROCEDURE TRAY) ×4 IMPLANT
KIT BUTTON TIGHTROPE ABS 8X12 (Anchor) ×1 IMPLANT
KIT TURNOVER KIT B (KITS) ×4 IMPLANT
LOOP VESSEL MAXI BLUE (MISCELLANEOUS) ×1 IMPLANT
MANIFOLD NEPTUNE II (INSTRUMENTS) IMPLANT
NDL 18GX1X1/2 (RX/OR ONLY) (NEEDLE) IMPLANT
NDL TAPERED W/ NITINOL LOOP (MISCELLANEOUS) IMPLANT
NEEDLE 18GX1X1/2 (RX/OR ONLY) (NEEDLE) IMPLANT
NEEDLE 22X1 1/2 (OR ONLY) (NEEDLE) ×4 IMPLANT
NEEDLE TAPERED W/ NITINOL LOOP (MISCELLANEOUS) ×4 IMPLANT
NS IRRIG 1000ML POUR BTL (IV SOLUTION) IMPLANT
PACK ARTHROSCOPY DSU (CUSTOM PROCEDURE TRAY) ×4 IMPLANT
PAD ARMBOARD 7.5X6 YLW CONV (MISCELLANEOUS) ×8 IMPLANT
PADDING CAST COTTON 6X4 STRL (CAST SUPPLIES) ×4 IMPLANT
PORT APPOLLO RF 90DEGREE MULTI (SURGICAL WAND) ×4 IMPLANT
SCREW BIO VENTED FT 7X30 (Screw) ×1 IMPLANT
SCREW BIOCOMP 7X20 (Screw) ×1 IMPLANT
SOL PREP POV-IOD 4OZ 10% (MISCELLANEOUS) ×4 IMPLANT
SPONGE T-LAP 18X18 ~~LOC~~+RFID (SPONGE) ×2 IMPLANT
SPONGE T-LAP 4X18 ~~LOC~~+RFID (SPONGE) ×4 IMPLANT
STRIP CLOSURE SKIN 1/2X4 (GAUZE/BANDAGES/DRESSINGS) ×2 IMPLANT
SUT ETHILON 3 0 PS 1 (SUTURE) ×8 IMPLANT
SUT FIBERWIRE #2 38 T-5 BLUE (SUTURE) ×8
SUT MENISCAL KIT (KITS) IMPLANT
SUT VIC AB 0 CT1 27 (SUTURE) ×4
SUT VIC AB 0 CT1 27XBRD ANBCTR (SUTURE) IMPLANT
SUT VIC AB 1 CT1 27 (SUTURE) ×3
SUT VIC AB 1 CT1 27XBRD ANBCTR (SUTURE) IMPLANT
SUT VIC AB 2-0 CT1 27 (SUTURE) ×2
SUT VIC AB 2-0 CT1 36 (SUTURE) ×3 IMPLANT
SUT VIC AB 2-0 CT1 TAPERPNT 27 (SUTURE) IMPLANT
SUTURE FIBERWR #2 38 T-5 BLUE (SUTURE) IMPLANT
SUTURE TAPE 1.3 FIBERLOP 20 ST (SUTURE) IMPLANT
SUTURETAPE 1.3 FIBERLOOP 20 ST (SUTURE) ×8
SYR 20ML ECCENTRIC (SYRINGE) ×4 IMPLANT
SYR 20ML LL LF (SYRINGE) ×1 IMPLANT
SYR CONTROL 10ML LL (SYRINGE) IMPLANT
SYR TB 1ML LUER SLIP (SYRINGE) ×4 IMPLANT
TISSUE GRAFTLINK 65-95MML (Tissue) ×4 IMPLANT
TOWEL GREEN STERILE (TOWEL DISPOSABLE) ×4 IMPLANT
TOWEL GREEN STERILE FF (TOWEL DISPOSABLE) ×4 IMPLANT
TUBE CONNECTING 12X1/4 (SUCTIONS) ×4 IMPLANT
TUBING ARTHROSCOPY IRRIG 16FT (MISCELLANEOUS) ×4 IMPLANT
WAND 90 DEG TURBOVAC W/CORD (SURGICAL WAND) IMPLANT
WATER STERILE IRR 1000ML POUR (IV SOLUTION) ×4 IMPLANT

## 2021-09-28 NOTE — Progress Notes (Signed)
Inpatient Rehabilitation Care Coordinator Discharge Note   Patient Details  Name: Devin Jones MRN: [id number] Date of Birth: May 06, 1966   Discharge location: D/c to acute for ortho procedure. Once d/c will d/c to home with his brother Devin Jones of Stay: 20 days  Discharge activity level: wheelchair level Modified Independent for household wheelchair mobility with bed<>w/c transfers, supervision-CGA unlevel and car transfers, supervision community level wheelchair mobility, and min A ramp navigation  Home/community participation: Limited  Patient response HW:EXHBZJ Literacy - How often do you need to have someone help you when you read instructions, pamphlets, or other written material from your doctor or pharmacy?: Never  Patient response IR:CVELFY Isolation - How often do you feel lonely or isolated from those around you?: Never  Services provided included: MD, RD, PT, OT, RN, CM, TR, Pharmacy, Neuropsych, SW  Financial Services:  Field seismologist Utilized: Other (Comment) (self-pay; medicaid Pending)    Choices offered to/list presented to: Yes  Follow-up services arranged:  DME, Home Health Home Health Agency: [facility] home health declined as over income    DME : one arm drive- loaner w/c provided by [facility]  Patient response to transportation need: Is the patient able to respond to transportation needs?: Yes In the past 12 months, has lack of transportation kept you from medical appointments or from getting medications?: No In the past 12 months, has lack of transportation kept you from meetings, work, or from getting things needed for daily living?: No  Comments (or additional information):  Patient/Family verbalized understanding of follow-up arrangements:  Yes  Individual responsible for coordination of the follow-up plan: contact pt [phone]  Confirmed correct DME delivered: [name] 09/28/2021    [name]

## 2021-09-28 NOTE — Brief Op Note (Signed)
° °  09/28/2021  7:44 PM  PATIENT:  Devin Jones  56 y.o. male  PRE-OPERATIVE DIAGNOSIS:  Right Knee anterior cruciate ligament and posterior cruciate ligament tear. Left knee lateral collateral ligament tear, posterior lateral corner tear.and lateral meniscal tear with retained hardware right wrist  POST-OPERATIVE DIAGNOSIS: Same as preop diagnosis.  PROCEDURE:  Procedure(s): #1 arthroscopic PCL reconstruction with soft tissue allograft #2 arthroscopic ACL reconstruction with bone patella tendon bone allograft #3 left knee arthroscopy with partial lateral meniscectomy #4 left knee allograft posterior lateral corner and popliteal fibular and lateral collateral ligament reconstruction #5 removal of hardware superficial right wrist t  SURGEON:  Surgeon(s): August Saucer, Corrie Mckusick, MD  ASSISTANT: Karenann Cai, PA  ANESTHESIA:   general  EBL: 75 ml    No intake/output data recorded.  BLOOD ADMINISTERED: none  DRAINS: none   LOCAL MEDICATIONS USED: Marcaine morphine clonidine vancomycin powder SPECIMEN:  No Specimen  COUNTS:  YES  TOURNIQUET:   Total Tourniquet Time Documented: Thigh (Left) - 81 minutes Total: Thigh (Left) - 81 minutes   DICTATION: .Other Dictation: Dictation Number [id number]  PLAN OF CARE: Admit to inpatient   PATIENT DISPOSITION:  PACU - hemodynamically stable

## 2021-09-28 NOTE — Progress Notes (Signed)
Inpatient Rehabilitation Discharge Medication Review by a Pharmacist  A complete drug regimen review was completed for this patient to identify any potential clinically significant medication issues.  High Risk Drug Classes Is patient taking? 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 Merilynn Finland, PharmD, BCPS Clinical Staff Pharmacist [email or web address] Pasty Spillers 09/27/2021 12:25 PM

## 2021-09-28 NOTE — Transfer of Care (Signed)
Immediate Anesthesia Transfer of Care Note  Patient: Devin Jones  Procedure(s) Performed: ANTERIOR CRUCIATE LIGAMENT (ACL) & POSTERIOR CRUCIATE LIGAMENT (PCL) REPAIR (Right: Knee) LATERAL COLLATERAL LIGAMENT 7 POSTERIOR LATERAL CORNER REPAIR (Left: Knee) HARDWARE REMOVAL (Right: Wrist)  Patient Location: PACU  Anesthesia Type:GA combined with regional for post-op pain  Level of Consciousness: awake  Airway & Oxygen Therapy: Patient Spontanous Breathing and Patient connected to face mask oxygen  Post-op Assessment: Report given to RN and Post -op Vital signs reviewed and stable  Post vital signs: Reviewed and stable  Last Vitals:  Vitals Value Taken Time  BP 139/88 09/28/21 1943  Temp    Pulse 85 09/28/21 1944  Resp 17 09/28/21 1944  SpO2 100 % 09/28/21 1944  Vitals shown include unvalidated device data.  Last Pain:  Vitals:   09/28/21 1014  TempSrc: Oral  PainSc: 0-No pain         Complications: No notable events documented.

## 2021-09-28 NOTE — Anesthesia Preprocedure Evaluation (Deleted)
Anesthesia Evaluation  Patient identified by MRN, date of birth, ID band Patient awake    Reviewed: Allergy & Precautions, NPO status , Patient's Chart, lab work & pertinent test results  Airway Mallampati: II  TM Distance: >3 FB Neck ROM: Full    Dental no notable dental hx.    Pulmonary neg pulmonary ROS,    Pulmonary exam normal breath sounds clear to auscultation       Cardiovascular Exercise Tolerance: Good negative cardio ROS Normal cardiovascular exam Rhythm:Regular Rate:Normal     Neuro/Psych negative neurological ROS  negative psych ROS   GI/Hepatic (+)     substance abuse  alcohol use,   Endo/Other  negative endocrine ROS  Renal/GU negative Renal ROS  negative genitourinary   Musculoskeletal negative musculoskeletal ROS (+)   Abdominal   Peds negative pediatric ROS (+)  Hematology negative hematology ROS (+)   Anesthesia Other Findings   Reproductive/Obstetrics negative OB ROS                             Anesthesia Physical Anesthesia Plan  ASA: 2  Anesthesia Plan: General and Regional   Post-op Pain Management: Tylenol PO (pre-op)   Induction: Intravenous  PONV Risk Score and Plan: 2 and Treatment may vary due to age or medical condition, Ondansetron and Dexamethasone  Airway Management Planned: Oral ETT  Additional Equipment: None  Intra-op Plan:   Post-operative Plan: Extubation in OR  Informed Consent: I have reviewed the patients History and Physical, chart, labs and discussed the procedure including the risks, benefits and alternatives for the proposed anesthesia with the patient or authorized representative who has indicated his/her understanding and acceptance.     Dental advisory given  Plan Discussed with: CRNA, Anesthesiologist and Surgeon  Anesthesia Plan Comments:         Anesthesia Quick Evaluation

## 2021-09-28 NOTE — Anesthesia Postprocedure Evaluation (Signed)
Anesthesia Post Note  Patient: Lenorris Karger  Procedure(s) Performed: ANTERIOR CRUCIATE LIGAMENT (ACL) & POSTERIOR CRUCIATE LIGAMENT (PCL) REPAIR (Right: Knee) LATERAL COLLATERAL LIGAMENT 7 POSTERIOR LATERAL CORNER REPAIR (Left: Knee) HARDWARE REMOVAL (Right: Wrist)     Patient location during evaluation: PACU Anesthesia Type: Regional Level of consciousness: awake Pain management: pain level controlled Vital Signs Assessment: post-procedure vital signs reviewed and stable Cardiovascular status: stable Postop Assessment: no apparent nausea or vomiting Anesthetic complications: no   No notable events documented.  Last Vitals:  Vitals:   09/28/21 2015 09/28/21 2030  BP: 131/80 137/88  Pulse: 76 72  Resp: (!) 9 10  Temp:    SpO2: 100% 95%    Last Pain:  Vitals:   09/28/21 2015  TempSrc:   PainSc: 5                  Francy Mcilvaine

## 2021-09-28 NOTE — Anesthesia Procedure Notes (Signed)
Anesthesia Regional Block: Adductor canal block   Pre-Anesthetic Checklist: , timeout performed,  Correct Patient, Correct Site, Correct Laterality,  Correct Procedure, Correct Position, site marked,  Risks and benefits discussed,  Surgical consent,  Pre-op evaluation,  At surgeon's request and post-op pain management  Laterality: Right  Prep: chloraprep       Needles:  Injection technique: Single-shot  Needle Type: Echogenic Stimulator Needle     Needle Length: 10cm  Needle Gauge: 20     Additional Needles:   Procedures:,,,, ultrasound used (permanent image in chart),,    Narrative:  Start time: 09/28/2021 10:55 AM End time: 09/28/2021 11:00 AM Injection made incrementally with aspirations every 5 mL.  Performed by: Personally  Anesthesiologist: Mellody Dance, MD  Additional Notes: Functioning IV was confirmed and monitors were applied.  Sterile prep and drape,hand hygiene and sterile gloves were used. Ultrasound guidance: relevant anatomy identified, needle position confirmed, local anesthetic spread visualized around nerve(s)., vascular puncture avoided. Negative aspiration and negative test dose prior to incremental administration of local anesthetic. The patient tolerated the procedure well.

## 2021-09-28 NOTE — Anesthesia Procedure Notes (Signed)
Procedure Name: Intubation Date/Time: 09/28/2021 12:30 PM Performed by: Minerva Ends, CRNA Pre-anesthesia Checklist: Patient identified, Emergency Drugs available, Suction available and Patient being monitored Patient Re-evaluated:Patient Re-evaluated prior to induction Oxygen Delivery Method: Circle system utilized Preoxygenation: Pre-oxygenation with 100% oxygen Induction Type: IV induction Ventilation: Mask ventilation without difficulty Laryngoscope Size: Mac and 3 Grade View: Grade I Tube type: Oral Tube size: 7.0 mm Number of attempts: 1 Airway Equipment and Method: Stylet Placement Confirmation: ETT inserted through vocal cords under direct vision, positive ETCO2 and breath sounds checked- equal and bilateral Secured at: 23 cm Tube secured with: Tape Dental Injury: Teeth and Oropharynx as per pre-operative assessment

## 2021-09-28 NOTE — Progress Notes (Signed)
Patient with multi ligament injury both knees.  He is about 6 weeks out from right knee open MCL and posterior medial corner repair and reconstruction.  He has ACL and PCL injury on this side as well.  Plan at this time today is for allograft reconstruction of the ACL and PCL.  On the left leg he has posterolateral corner injury with lateral collateral ligament injury.  Plan for the left knee is arthroscopy and open posterolateral corner reconstruction using allograft.  Risk and benefits are discussed with the patient.  He stopped Lovenox 2 days ago.  All questions answered.

## 2021-09-28 NOTE — Progress Notes (Signed)
Orthopedic Tech Progress Note Patient Details:  Devin Jones [birth year] [id number]  CPM Right Knee CPM Right Knee: Other (Comment) Right Knee Flexion (Degrees): 0 Right Knee Extension (Degrees): 50 Additional Comments: Patient wanted to start CPM tomorrow morning  Post Interventions Patient Tolerated: Other (comment) Instructions Provided: Other (comment)  Michelle Piper 09/28/2021, 10:29 PM
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
## 2021-09-29 ENCOUNTER — Other Ambulatory Visit (HOSPITAL_COMMUNITY): Payer: Self-pay

## 2021-09-29 LAB — CBC
HCT: 38.8 % — ABNORMAL LOW (ref 39.0–52.0)
Hemoglobin: 12.4 g/dL — ABNORMAL LOW (ref 13.0–17.0)
MCH: 30.2 pg (ref 26.0–34.0)
MCHC: 32 g/dL (ref 30.0–36.0)
MCV: 94.6 fL (ref 80.0–100.0)
Platelets: 261 10*3/uL (ref 150–400)
RBC: 4.1 MIL/uL — ABNORMAL LOW (ref 4.22–5.81)
RDW: 14.3 % (ref 11.5–15.5)
WBC: 8.1 10*3/uL (ref 4.0–10.5)
nRBC: 0 % (ref 0.0–0.2)

## 2021-09-29 LAB — BASIC METABOLIC PANEL
Anion gap: 11 (ref 5–15)
BUN: 11 mg/dL (ref 6–20)
CO2: 22 mmol/L (ref 22–32)
Calcium: 8.7 mg/dL — ABNORMAL LOW (ref 8.9–10.3)
Chloride: 104 mmol/L (ref 98–111)
Creatinine, Ser: 0.77 mg/dL (ref 0.61–1.24)
GFR, Estimated: >60 mL/min (ref >60)
Glucose, Bld: 146 mg/dL — ABNORMAL HIGH (ref 70–99)
Potassium: 4.3 mmol/L (ref 3.5–5.1)
Sodium: 137 mmol/L (ref 135–145)

## 2021-09-29 NOTE — Op Note (Signed)
NAMEYOHANCE, NORLANDER MEDICAL RECORD NO: [id number] ACCOUNT NO: [id number] DATE OF BIRTH: 01-15-1966 FACILITY: [facility] LOCATION: [id number] PHYSICIAN: Yetta Barre. Marlou Sa, MD  Operative Report   DATE OF PROCEDURE: 09/28/2021  PREOPERATIVE DIAGNOSES:   1.  Right knee anterior cruciate ligament and posterior cruciate ligament tear. 2.  Left knee lateral meniscal tear and posterolateral corner injury. 3.  Retained hardware, right wrist.  POSTOPERATIVE DIAGNOSES: 1.  Right knee anterior cruciate ligament and posterior cruciate ligament tear. 2.  Left knee lateral meniscal tear and posterolateral corner injury. 3.  Retained hardware, right wrist.  PROCEDURE PERFORMED:   1.  Right knee arthroscopic PCL reconstruction using soft tissue allograft. 2.  Right knee ACL reconstruction using bone-patellar tendon-bone allograft. 3.  Left knee arthroscopy with partial lateral meniscectomy. 4.  Left knee open posterolateral corner reconstruction using soft tissue allograft, reconstructing the popliteal fibular ligament and lateral collateral ligament and posterolateral corner. 5.  Removal of superficial hardware, right wrist.  SURGEON:  Yetta Barre. Marlou Sa, MD  ASSISTANT:  Annie Main.  INDICATIONS:  The patient is a 56 year old patient who underwent surgery approximately a month ago for right knee multi-ligament instability.  He had the medial side addressed.  That side is doing well.  He has anterior posterior instability due to ACL  and PCL laxity along with posterolateral corner injury on that left hand side.  He also has retained hardware in the right wrist.  He presents now for operative management after explanation of risks and benefits.  PROCEDURE IN DETAIL:  The patient was brought to the operating room where general endotracheal anesthesia was induced.  Preoperative antibiotics were administered.  Timeout was called.  Both legs were pre-scrubbed with alcohol, Betadine, and allowed to  air dry.  Prepped  with DuraPrep solution and draped in a sterile manner.  Ioban used to cover the operative field bilaterally.  On the right hand side, the patient had positive posterior drawer, positive anterior drawer, positive Lachman.  No  posterolateral rotatory instability was present.  The patient had good stability to valgus stress at 0 degrees with 1.5 mm of opening and 30 degrees with about 3 mm of opening with solid endpoint.  The patient had full extension with no hyperextension  and flexion to about 115.  After calling a timeout, anterior inferolateral and anterior inferomedial portals were established.  Diagnostic arthroscopy was performed.  The patient had no lateral meniscal pathology or lateral compartment pathology.  The  ACL and PCL were torn.  The medial meniscal repair looked good circumferentially.  The posterior horn attachment was somewhat attenuated, but did appear to be intact.  No intervention required on the medial meniscus.  Chondral surface did have grade 1-2  chondromalacia on that medial femoral condyle and medial tibial plateau.  Next, the ACL and PCL stumps were debrided.  Notchplasty performed.  A superior proximal medial portal was established under direct visualization.  Through this portal debridement  of the posterior central tibial plateau at the PCL attachment was performed with care being taken to avoid injury to the neurovascular structures.  Good visualization was achieved.  At this time, graft was prepared on the back table for PCL by Annie Main, PA.  Under fluoroscopic guidance, the tibial tunnel was drilled at 55 degree setting.  Flip cutter was placed.  An 11 mm tunnel was drilled.  FiberStick was passed and suture was passed into the tunnel.  Next, the femoral tunnel for the PCL was  drilled.  This  was drilled in the native PCL footprint around the 1 o'clock position.  An 11 mm tunnel was drilled.  Next, the graft was passed first into the tibial tunnel, but not fixed and  then into the femoral tunnel where the Endobutton was flipped.   Very good fixation achieved into the femoral and tibial tunnels.  The tibial tunnel was not secured.  Next, the tibial tunnel on the ACL was drilled in the posterior aspect of the ACL footprint using a 10 mm acorn reamer.  Over the top position was  achieved.  The tibial tunnel was drilled at a 50 degree angle.  A 2 mm back wall was ensured.  The graft was then passed and secured on the femoral side using an interference screw 7 x 23 mm.  This gave very secure fixation.  Next, the knee was flexed to  90 degrees and the PCL graft was fixed on the tibial side using Endobutton with anterior drawer recreated.  Secure fixation was achieved.  Backup fixation then performed on the graft as well as the internal braces using a SwiveLock.  This gave a very  nice fixation.  Next, ACL graft was fixed on the tibial side using an interference screw 8 x 30 mm with backup fixation of the SwiveLock with good fixation achieved.  This was tightened in extension.  The patient had very much improved anterior,  posterior stability with about 2-3 mm posterior drawer at 90 degrees with good endpoint and 2 mm anterior drawer with solid endpoint in extension.  Knee joint was thoroughly irrigated.  All incisions for the multiple tunnels were irrigated.  The  incisions were then closed using 0 Vicryl suture, 2-0 Vicryl suture, and 3-0 nylon.  It should be noted that the grafts were wrapped in a vancomycin-soaked sponge.  Impervious dressings and Ace wrap applied.  The Ace wrap was applied at the end of the  case.  Attention was then directed towards the left knee.  Anterior inferolateral and anterior inferomedial portals were established.  Diagnostic arthroscopy was performed.  The patient had intact ACL, PCL.  No loose bodies in the medial and lateral  gutters. Medial and lateral compartments were intact, but there was a medial meniscal tear, which was debrided back to a  stable rim using combination of basket punch and shaver.  About 75% of the anterior posterior width of that meniscus remained.   Instruments were removed.  Portals were covered using Tegaderm.  Next, the tourniquet was elevated on that left leg for 81 minutes.  An incision made at the proximal lateral distal femur, extending down between the fibular head and Gerdy's tubercle.   Skin and subcutaneous tissue were sharply divided.  The peroneal nerve was then identified and protected.  Dissected proximally and distally.  There was some evidence of injury, but not very much scarring present around the nerve.  Next, a guide pin was  placed through the anterolateral aspect of the fibular head to the posteromedial aspect of the fibular head and a 5 mm tunnel was drilled with neurovascular structures protected.  FiberStick was placed.  Next, the native LCL was identified and tagged  with the grasping sutures.  It was very attenuated at its attachment and was detached.  Next, the iliotibial band was opened up at the superior window of [place].  The proximal aspect of the popliteal hiatus was drilled and a 5 mm soft tissue allograft  was placed with a SwiveLock into that 20 mm tunnel  with excellent fixation achieved.  Next, this was passed behind the iliotibial band into the posterior aspect of that fibular head.  It was then passed through the fibular head and secured with internal  rotation force with the interference screw.  This gave very nice rotational stability.  Next, the graft was then passed back on top of the PCL and into a drilled hole at the native [facility] attachment.  The native [facility] was incorporated with the allograft into  a bony tunnel, which was drilled.  The guide pin was drilled and then that guide pin was used to shuttle both suture limbs into the socket.  Then, with equal tension on both limbs, the interference screw was placed, which was 8 x 20 mm interference  screw.  This gave excellent fixation.   This was done with the knee in about 20 degrees from full extension with valgus stress.  The native LCL graft was then sutured to the LCL reconstruction.  Tourniquet was released at this time.  Thorough irrigation  was performed.  Vancomycin powder placed.  The iliotibial band was closed using #1 Vicryl suture, followed by 0 Vicryl suture, 2-0 Vicryl suture, and 3-0 Monocryl.  Vancomycin powder was placed.  3-0 Monocryl and Steri-Strips were applied.  It should be  noted that a solution of Marcaine, morphine, clonidine injected into the right knee 10 mL for postop pain relief.  20 mL injected into the lateral incision for postop pain relief.  We went to the right wrist.  Betadine was applied to both incisions where  the pin was sticking out.  One end was cut and the other end was removed.  Wrist was stable.  Sutures from the incision were also removed.  The patient tolerated that part of the procedure well also.  Bulky dressings and knee immobilizers placed on both  knees.  The patient tolerated the procedure well without immediate complications, was transferred to the recovery room in stable condition.  Luke's assistance was required for graft preparation, opening, closing, tissue mobilization.  His assistance was  a medical necessity.   VAI D: 09/28/2021 7:59:21 pm T: 09/29/2021 12:17:00 am  JOB: [id number]

## 2021-09-29 NOTE — Progress Notes (Signed)
°  Subjective: Patient is a 56 year old male who is POD 1 s/p right knee anterior cruciate ligament reconstruction with bone patellar tendon bone allograft and posterior cruciate ligament reconstruction with quadricep allograft; left knee posterior lateral corner/LCL reconstruction with tibialis anterior allograft.  Reports he is doing well overall.  Pain is controlled but increased based on his activity in bed.  He is still nonweightbearing due to prior pelvic surgery.  He has worked with physical therapy this morning and did have some difficulty with transferring compared with several days ago.  No chest pain or shortness of breath.  No calf pain or abdominal pain currently.  Overall has no complaints aside from the knee pain.   Objective: Vital signs in last 24 hours: Temp:  [97.6 F (36.4 C)-98.6 F (37 C)] 98 F (36.7 C) (01/13 1208) Pulse Rate:  [72-94] 79 (01/13 1208) Resp:  [9-18] 18 (01/13 1208) BP: (108-139)/(69-94) 115/77 (01/13 1208) SpO2:  [95 %-100 %] 95 % (01/13 1257)  Intake/Output from previous day: 01/12 0701 - 01/13 0700 In: 2000 [I.V.:2000] Out: 1280 [Urine:1130; Blood:150] Intake/Output this shift: Total I/O In: 1126.1 [I.V.:826.1; IV Piggyback:300] Out: 1400 [Urine:1400]  Exam:  Ortho exam demonstrates knees with dressings that are clean dry and intact.  No calf tenderness bilaterally.  Negative Homans' sign bilaterally.  Patient is unable to perform straight leg raise on either leg at this time.  Palpable DP pulse rated 2+ bilaterally.  Intact ankle dorsiflexion plantarflexion bilaterally.  Compartments are soft and compressible.  Mild knee effusions present bilaterally.  ACL graft is stable on Lachman exam on the right knee.  Left knee is currently in Bledsoe brace.  Labs: Recent Labs    09/28/21 2306 09/29/21 0248  HGB 13.2 12.4*   Recent Labs    09/28/21 2306 09/29/21 0248  WBC 9.6 8.1  RBC 4.46 4.10*  HCT 42.0 38.8*  PLT 253 261   Recent Labs     09/28/21 2306 09/29/21 0248  NA  --  137  K  --  4.3  CL  --  104  CO2  --  22  BUN  --  11  CREATININE 0.98 0.77  GLUCOSE  --  146*  CALCIUM  --  8.7*   No results for input(s): LABPT, INR in the last 72 hours.  Assessment/Plan: Patient is POD 1 s/p bilateral knee surgery.  Plan is continue nonweightbearing status regarding the right upper upper extremity and bilateral lower extremities following his pelvic and wrist surgeries by Dr. Carola Frost.  Pain is controlled today.  Neurovascular intact to both legs.  Plan is to work on transferring primarily with physical therapy and anticipate discharge out of the hospital for him to stay with his brother on Monday most likely.  Vital signs and labs are stable.   Dorise Gangi L Vasiliy Mccarry 09/29/2021, 6:03 PM
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
## 2021-09-29 NOTE — Progress Notes (Signed)
Pt refused CPM this shift; he said he wanted to start it on dayshift today.

## 2021-09-30 ENCOUNTER — Other Ambulatory Visit: Payer: Self-pay

## 2021-09-30 LAB — CBC
HCT: 34.6 % — ABNORMAL LOW (ref 39.0–52.0)
Hemoglobin: 11.6 g/dL — ABNORMAL LOW (ref 13.0–17.0)
MCH: 30.9 pg (ref 26.0–34.0)
MCHC: 33.5 g/dL (ref 30.0–36.0)
MCV: 92.3 fL (ref 80.0–100.0)
Platelets: 214 10*3/uL (ref 150–400)
RBC: 3.75 MIL/uL — ABNORMAL LOW (ref 4.22–5.81)
RDW: 14.6 % (ref 11.5–15.5)
WBC: 7 10*3/uL (ref 4.0–10.5)
nRBC: 0 % (ref 0.0–0.2)

## 2021-09-30 MED ORDER — GABAPENTIN 300 MG PO CAPS
300.0000 mg | ORAL_CAPSULE | Freq: Three times a day (TID) | ORAL | Status: DC
  Administered 2021-09-30 – 2021-10-02 (×8): 300 mg via ORAL
  Filled 2021-09-30 (×8): qty 1

## 2021-09-30 NOTE — Progress Notes (Signed)
Patient stable today Pain controlled Tolerating PT Gabapentin restarted Will keep patient here through the weekend

## 2021-09-30 NOTE — Plan of Care (Signed)
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
## 2021-09-30 NOTE — Plan of Care (Signed)

## 2021-09-30 NOTE — Plan of Care (Signed)
  Problem: Activity: Goal: Risk for activity intolerance will decrease Outcome: Progressing   Problem: Pain Managment: Goal: General experience of comfort will improve Outcome: Progressing   
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
## 2021-10-01 NOTE — Plan of Care (Signed)

## 2021-10-01 NOTE — Progress Notes (Signed)
Place left leg in bledsoe brace and then in CPM at 2030 at 60 degrees. Patient is tolerating movement well.

## 2021-10-01 NOTE — Progress Notes (Signed)
Subjective: 3 Days Post-Op Procedure(s) (LRB): ANTERIOR CRUCIATE LIGAMENT (ACL) & POSTERIOR CRUCIATE LIGAMENT (PCL) REPAIR (Right) LATERAL COLLATERAL LIGAMENT 7 POSTERIOR LATERAL CORNER REPAIR (Left) HARDWARE REMOVAL (Right) Patient reports pain as mild.   Objective: Vital signs in last 24 hours: Temp:  [98 F (36.7 C)-98.9 F (37.2 C)] 98.8 F (37.1 C) (01/15 0821) Pulse Rate:  [84-92] 84 (01/15 0821) Resp:  [14-18] 18 (01/15 0821) BP: (101-121)/(61-80) 121/80 (01/15 0821) SpO2:  [91 %-98 %] 98 % (01/15 0821) Weight:  [82 kg] 82 kg (01/14 2125)  Intake/Output from previous day: 01/14 0701 - 01/15 0700 In: 908.5 [P.O.:735; I.V.:173.5] Out: 1800 [Urine:1800] Intake/Output this shift: Total I/O In: -  Out: 300 [Urine:300]  Recent Labs    09/28/21 2306 09/29/21 0248 09/30/21 0306  HGB 13.2 12.4* 11.6*   Recent Labs    09/29/21 0248 09/30/21 0306  WBC 8.1 7.0  RBC 4.10* 3.75*  HCT 38.8* 34.6*  PLT 261 214   Recent Labs    09/28/21 2306 09/29/21 0248  NA  --  137  K  --  4.3  CL  --  104  CO2  --  22  BUN  --  11  CREATININE 0.98 0.77  GLUCOSE  --  146*  CALCIUM  --  8.7*   No results for input(s): LABPT, INR in the last 72 hours.  Neurovascular intact Sensation intact distally Intact pulses distally Dorsiflexion/Plantar flexion intact Incision: scant drainage No cellulitis present Compartment soft   Assessment/Plan: 3 Days Post-Op Procedure(s) (LRB): ANTERIOR CRUCIATE LIGAMENT (ACL) & POSTERIOR CRUCIATE LIGAMENT (PCL) REPAIR (Right) LATERAL COLLATERAL LIGAMENT 7 POSTERIOR LATERAL CORNER REPAIR (Left) HARDWARE REMOVAL (Right) Up with therapy NWB RUE NWB BLE Has not been successful with transfers so will need to continue PT until safe to d/c home.      Cristie Hem 10/01/2021, 9:25 AM

## 2021-10-02 ENCOUNTER — Other Ambulatory Visit (HOSPITAL_COMMUNITY): Payer: Self-pay

## 2021-10-02 ENCOUNTER — Encounter (HOSPITAL_COMMUNITY): Payer: Self-pay | Admitting: Orthopedic Surgery

## 2021-10-02 DIAGNOSIS — E8809 Other disorders of plasma-protein metabolism, not elsewhere classified: Secondary | ICD-10-CM

## 2021-10-02 DIAGNOSIS — R739 Hyperglycemia, unspecified: Secondary | ICD-10-CM

## 2021-10-02 DIAGNOSIS — R748 Abnormal levels of other serum enzymes: Secondary | ICD-10-CM

## 2021-10-02 MED ORDER — OXYCODONE HCL 5 MG PO TABS
5.0000 mg | ORAL_TABLET | Freq: Three times a day (TID) | ORAL | 0 refills | Status: DC | PRN
  Filled 2021-10-02: qty 21, 7d supply, fill #0

## 2021-10-02 MED ORDER — GABAPENTIN 300 MG PO CAPS
300.0000 mg | ORAL_CAPSULE | Freq: Three times a day (TID) | ORAL | 0 refills | Status: DC
  Filled 2021-10-02: qty 90, 30d supply, fill #0

## 2021-10-02 MED ORDER — METHOCARBAMOL 500 MG PO TABS
500.0000 mg | ORAL_TABLET | Freq: Three times a day (TID) | ORAL | 0 refills | Status: DC | PRN
  Filled 2021-10-02: qty 30, 10d supply, fill #0

## 2021-10-02 MED ORDER — ENOXAPARIN SODIUM 40 MG/0.4ML IJ SOSY
40.0000 mg | PREFILLED_SYRINGE | INTRAMUSCULAR | 0 refills | Status: DC
  Filled 2021-10-02: qty 10, 25d supply, fill #0

## 2021-10-02 NOTE — Progress Notes (Signed)
°  Subjective: Patient stable.  No complaints today aside from knee pain.  Denies any abdominal pain, chest pain, shortness of breath, calf pain.  Did better with therapy yesterday than he did on Saturday.  He feels he is ready for discharge home.   Objective: Vital signs in last 24 hours: Temp:  [97.7 F (36.5 C)-98.2 F (36.8 C)] 97.7 F (36.5 C) (01/16 0635) Pulse Rate:  [82-91] 82 (01/16 0635) Resp:  [15-16] 16 (01/16 0635) BP: (109-114)/(70-71) 109/71 (01/16 0635) SpO2:  [99 %] 99 % (01/16 0635)  Intake/Output from previous day: 01/15 0701 - 01/16 0700 In: 480 [P.O.:480] Out: 2125 [Urine:2125] Intake/Output this shift: No intake/output data recorded.  Exam:  Ortho exam demonstrates bilateral knees with postop dressings intact.  No calf tenderness, negative Homans' sign bilaterally.  Able to perform straight leg raise with both legs with no extensor lag.  No significant erythema noted.  2+ DP pulse of bilateral lower extremities.  Intact ankle dorsiflexion bilaterally.  He has about 0 degrees extension to 60 degrees of knee flexion bilaterally  Labs: Recent Labs    09/30/21 0306  HGB 11.6*   Recent Labs    09/30/21 0306  WBC 7.0  RBC 3.75*  HCT 34.6*  PLT 214   No results for input(s): NA, K, CL, CO2, BUN, CREATININE, GLUCOSE, CALCIUM in the last 72 hours. No results for input(s): LABPT, INR in the last 72 hours.  Assessment/Plan: Plan is likely discharge later today.  Want him to work with therapy 1 time this morning to see how he is able to transfer.  As long as he is able to transfer decently without any setbacks compared with yesterday, plan for discharge home to stay with his brother.  He will follow-up with Dr. August Saucer in 7 days for clinical recheck and suture removal.  Also plan to give him Dr. Magdalene Patricia office contact info so that he may reach out to them for follow-up regarding his pelvis and wrist.  Continue bilateral lower extremity nonweightbearing and right upper  extremity nonweightbearing.  Use the Bledsoe brace pretty much at all times on the left leg but only use knee immobilizer in the right leg when he is transferring but otherwise does not need it.   Devin Jones 10/02/2021, 8:40 AM

## 2021-10-02 NOTE — Progress Notes (Signed)
Removed left leg from COM, leaving on Bledsoe Brace and placed right leg in CPM at 2135 at 60 degrees. At 2240, right leg removed from CPM and placed in knee immobilizer. Patient reports pain is 1/10. Patient demonstrates no s/s of distress or concerns.
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
## 2021-10-02 NOTE — Progress Notes (Signed)
Patient was discharged to home, AVS was reviewed and all questions answered. Patient confirmed he had all of his belongings including [facility] and [facility]. Patient educated on lovenox injections, with teach back. Patient's brother provided transportation.   Patient left the floor with his family member before a staff member could assist them
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
## 2021-10-02 NOTE — TOC Transition Note (Signed)
Transition of Care [facility]) - CM/SW Discharge Note   Patient Details  Name: Devin Jones MRN: [id number] Date of Birth: 12-14-1965  [facility] of [facility]) CM/SW Contact:  Bess Kinds, RN Phone Number: [phone] 10/02/2021, 11:00 AM   Clinical Narrative:     Notified by PT that patient needs a long slide board to assist with car transfers. Referral to [employer] for 30 inch slideboard for delivery to room. Patient to transition home today.   Final next level of care: Home/Self Care Barriers to Discharge: No Barriers Identified   Patient Goals and CMS Choice Patient states their goals for this hospitalization and ongoing recovery are:: going home with brother CMS Medicare.gov Compare Post Acute Care list provided to:: Patient Choice offered to / list presented to : Patient  Discharge Placement                       Discharge Plan and Services                DME Arranged: Other see comment (long 30 inch slideboard) DME Agency: [employer] Date DME Agency Contacted: 10/02/21 Time DME Agency Contacted: 1100 Representative spoke with at DME Agency: Velna Hatchet HH Arranged: NA HH Agency: NA        Social Determinants of Health (SDOH) Interventions     Readmission Risk Interventions No flowsheet data found.

## 2021-10-05 ENCOUNTER — Other Ambulatory Visit (HOSPITAL_COMMUNITY): Payer: Self-pay

## 2021-10-06 ENCOUNTER — Ambulatory Visit (INDEPENDENT_AMBULATORY_CARE_PROVIDER_SITE_OTHER): Payer: Self-pay | Admitting: Surgical

## 2021-10-06 ENCOUNTER — Encounter: Payer: Self-pay | Admitting: Orthopedic Surgery

## 2021-10-06 ENCOUNTER — Other Ambulatory Visit: Payer: Self-pay

## 2021-10-06 DIAGNOSIS — S83104D Unspecified dislocation of right knee, subsequent encounter: Secondary | ICD-10-CM

## 2021-10-06 DIAGNOSIS — Z9889 Other specified postprocedural states: Secondary | ICD-10-CM
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
## 2021-10-08 NOTE — Op Note (Signed)
10/08/2021 7:29 AM  PATIENT:  Devin Jones  PRE-OPERATIVE DIAGNOSIS:   UNSTABLE PELVIC RING, ANTERIOR AND POSTERIOR  2. RIGHT FOREARM GALEAZZI FRACTURE--RADIAL SHAFT FRACTURE AND UNSTABLE DRUJ  POST-OPERATIVE DIAGNOSIS:   UNSTABLE PELVIC RING, ANTERIOR AND POSTERIOR  2. RIGHT FOREARM GALEAZZI FRACTURE--RADIAL SHAFT FRACTURE AND UNSTABLE DRUJ  PROCEDURE:  Procedure(s): OPEN REDUCTION INTERNAL FIXATION OF ANTERIOR PELVIC RING, RIGHT AND LEFT PERCUTANEOUS SACRO-ILIAC SCREW FIXATION, RIGHT AND LEFT(BILATERAL), OF THE POSTERIOR PELVIC RING OPEN REDUCTION INTERNAL FIXATION OF THE RIGHT RADIAL SHAFT WITH CLOSED REDUCTION AND PINNING OF THE RIGHT DISTAL RADIOULNAR JOINT (GALEAZZI FRACTURE DISLOCATION)  SURGEON:  Surgeon(s) and Role:    Myrene Galas, MD - Primary  PHYSICIAN ASSISTANT: Montez Morita, PA-C  ANESTHESIA:   general  EBL:  150 mL   BLOOD ADMINISTERED: NONE  DRAINS: NONE   LOCAL MEDICATIONS USED:  NONE  SPECIMEN:  No Specimen  DISPOSITION OF SPECIMEN:  N/A  COUNTS:  YES  TOURNIQUET:  * No tourniquets in log *  DICTATION: Note written in EPIC  PLAN OF CARE: Admit to inpatient   PATIENT DISPOSITION:  PACU - hemodynamically stable.   Delay start of Pharmacological VTE agent (>24hrs) due to surgical blood loss or risk of bleeding: no  BRIEF SUMMARY AND INDICATIONS FOR PROCEDURE:  Patient sustained an unstable pelvic ring injury. Because of the nature and magnitude of these injuries, Dr. August Saucer deemed further management outside his scope of practice and asserted these injuries would be best managed by fellowship trained orthopedic traumatologist.  Consequently, I was consulted to assume management.  The risks and benefits of the surgery were discussed including infection, nonunion, malunion, arthritis, loss of fixation, sexual dysfunction, pain, nerve injury, vessel injury, DVT, PE, and others.  We also specifically discussed urologic injury and footdrop. After  acknowledgement of these risks, consent was provided to proceed.   BRIEF SUMMARY OF PROCEDURE:  The patient was taken to the operating room, where general anesthesia was induced.  His legs were positioned with a bump under the knees and held in close proximity.  The abdomen and flank were cleaned with chlorhexidine scrub and then Betadine scrub and paint.  Standard drape was then applied.    A time-out held and a Pfannenstiel incision made.  Dissection was carried down to the external abdominal muscles and the linea was divided midline.  This revealed complete disruption of the deep muscle insertion onto the pelvic brim.  A large volume of hematoma was removed. Little additional muscle removal off the pelvic brim was required.  After securing provisionally reduction with an anteriorly placed Weber tenaculum, a 6 hole anterior pelvic brim plate was contoured and then applied checking plate position with C-arm AP inlet and outlet views. Three bicortical screws were placed on each side of the symphysis and final AP inlet and outlet films showed appropriate reduction, hardware placement, trajectory and length.  Wound was irrigated thoroughly and then closed with figure-of-eight #1 Vicryl at the rectus insertion, 0 Vicryl, 2-0 Vicryl, and 2-0 nylon.  Sterile gently compressive dressing applied.  Montez Morita, PA-C, assisted me throughout and was necessary for deep retraction, reduction, and instrumentation.  The pin sites did have some necrotic material around them and consequently these were debrided with curettage with skin, subcutaneous level, and deep fascial level after removing the fixator pins with power.  They were thoroughly irrigated and left open.   Posteriorly, attention was turned first to the left SI joint.  A true lateral view of S1 vertebral  body was used to obtain the correct starting point and trajectory. The guide pin was advanced through the iliac bone, across the left SI joint and  into the S1 vetebral body. Before advancing the pin we checked its position on the inlet to make sure that it was posterior to the ala and anterior to the canal. We checked the outlet to make sure that it was superior to the S1 foramina and between the vertebral discs.  The guide pin was advanced. We then turned our attention to the right side of the posterior pelvis and further advanced the wire across the right ala, the SI joint, and to the outer cortex of the right ilium.  On this side we also checked  its position on the inlet to make sure that it was posterior to the ala and anterior to the canal, and the outlet to confirm superior to the S1 foramina and below the ala. The pin was measured for length, overdrilled, and then the screw with washer inserted. During both pin and screw placement my assistant manipulated the pelvis to assist reduction.  Final images showed appropriate placement and length across both sides of the pelvis.  An additional S2 screw was placed in similar manner, using the lateral for starting pint and trajectory. The pin was driven through the ilium, across one SI joint into the S2 vertebral body, and then driven across the opposite sacral body, SI joint, and ilium. We again used inlet and outlet views to carefully assume position in addition to final lateral view, as well. The screw with washer was then inserted after measuring and drilling. Excellent purchase was obtained and the screw was again carefully checked for length and trajectory using inlet, outlet, and lateral views.    Montez MoritaKeith Paul, PA-C, again assisted with the reduction portion. Wounds were irrigated thoroughly and closed in standard fashion with nylon. Sterile dressings were applied.   We then turned our attention to the right forearm. A separate prep and drape was then performed for the right forearm. The operative extremity was then washed with chlorhexidine soap and then a Betadine scrub and paint performed. A  volar Sherilyn CooterHenry approach was made using an 10 cm incision centered about the radius fracture site.  We carried dissection carefully through the superficial tissues, incising the fascia with the mobile wad retracted radially, then identifying the radial artery.  The deep interval was developed using electrocautery to carefully divide the branches to the radial artery and then visualizing the pronator muscle belly and tendon as it wrapped around the distal fragment at the fracture site.  We were able to expose the volar cortex, deliver the bone ends and clean with a curette and lavage, removing all hematoma.  Then, under direct visualization, placed lobster claw clamps and pulled distraction and interdigitated the fracture, reducing it anatomically.  I then applied the long combination volar Acumed plate, securing fixation on either side of the fracture, checking plate and fracture reduction with orthogonal x-rays and then continuing until over 8 cortices of purchase were obtained on either side of the fracture.  Final images showed restoration of radial bow, maintenance of cortical alignment, appropriate screw length and trajectory.  Again, assistant was necessary for this deep work and to provide retraction while maintaining reduction. I then checked the DRUJ (distal radial ulnar joint) and found it easily reducible but unstable with pronation. Consequently the decision was made to pin it under fluoro and leave pin protruding from both sides as was I  was concerned for potential pin breakage. Wounds were irrigated thoroughly, closed in standard layered fashion using 2-0 Vicryl for the subcutaneous layer and Monocryl for the skin layer and then Steri-Strips.  A sterile gently compressive dressing was applied and then a sugartong splint with the forearm in supination.  The patient was awakened from anesthesia and transported to the PACU in stable condition.    PROGNOSIS:  With regard to mobilization, the patient will be  bed to chair only.   Patient is at risk for SI joint arthrosis and pain. Patient will remain on the Trauma Service and may restart pharmacologic  DVT prophylaxis if no other contraindications.  Will follow closely. DRUJ pin to be removed at four weeks.      Doralee Albino. Carola Frost, M.D.

## 2021-10-08 NOTE — H&P (Signed)
Devin Jones is an 56 y.o. male.   Chief Complaint: Bilateral knee instability HPI: Devin Jones is a 56 year old patient with bilateral multi ligament knee injury.  On the right-hand side he has had open MCL and postero medial reconstruction and repair performed a month ago.  He has ACL and PCL laxity on the right-hand side.  On the left-hand side he has LCL and posterolateral corner injury.  Presents now for operative management of both knee instability patterns.  Has a pelvic fracture which is keeping him nonweightbearing.  He has been off Lovenox for 48 hours.  Past Medical History:  Diagnosis Date   Alcohol abuse     Past Surgical History:  Procedure Laterality Date   ANTERIOR CRUCIATE LIGAMENT REPAIR Right 09/28/2021   Procedure: ANTERIOR CRUCIATE LIGAMENT (ACL) & POSTERIOR CRUCIATE LIGAMENT (PCL) REPAIR;  Surgeon: Cammy Copa, MD;  Location: [facility] OR;  Service: Orthopedics;  Laterality: Right;   COSMETIC SURGERY     HARDWARE REMOVAL Right 09/28/2021   Procedure: HARDWARE REMOVAL;  Surgeon: Cammy Copa, MD;  Location: [facility] OR;  Service: Orthopedics;  Laterality: Right;   MEDIAL COLLATERAL LIGAMENT REPAIR, KNEE Right 08/31/2021   Procedure: RIGHT KNEE MEDIAL COLLATERAL LIGAMENT REPAIR/RECONSTRUCTION (ALL OPEN);  Surgeon: Cammy Copa, MD;  Location: [facility];  Service: Orthopedics;  Laterality: Right;   ORIF RADIAL FRACTURE Right 08/28/2021   Procedure: OPEN REDUCTION INTERNAL FIXATION (ORIF) RADIAL FRACTURE;  Surgeon: Myrene Galas, MD;  Location: [facility] OR;  Service: Orthopedics;  Laterality: Right;   SACRO-ILIAC PINNING Right 08/28/2021   Procedure: SACRO-ILIAC SCREW FIXATION WITH  ANTERIOR PELVIC REPAIR;  Surgeon: Myrene Galas, MD;  Location: [facility] OR;  Service: Orthopedics;  Laterality: Right;    Family History  Problem Relation Age of Onset   Kidney disease Mother    Hypertension Father    Heart disease Father    Liver cancer Father    Stroke Brother    Social  History:  reports that he has never smoked. He has never used smokeless tobacco. He reports current alcohol use of about 19.0 standard drinks per week. He reports that he does not use drugs.  Allergies: No Known Allergies  No medications prior to admission.    No results found for this or any previous visit (from the past 48 hour(s)). No results found.  Review of Systems  Musculoskeletal:  Positive for arthralgias.  All other systems reviewed and are negative.  Blood pressure 121/73, pulse 81, temperature 98 F (36.7 C), temperature source Oral, resp. rate 18, height 5\' 11"  (1.803 m), weight 82 kg, SpO2 99 %. Physical Exam Vitals reviewed.  HENT:     Head: Normocephalic.     Mouth/Throat:     Mouth: Mucous membranes are moist.  Eyes:     Pupils: Pupils are equal, round, and reactive to light.  Cardiovascular:     Rate and Rhythm: Normal rate.     Pulses: Normal pulses.  Pulmonary:     Effort: Pulmonary effort is normal.  Abdominal:     General: Abdomen is flat.  Musculoskeletal:     Cervical back: Normal range of motion.  Skin:    General: Skin is warm.     Capillary Refill: Capillary refill takes less than 2 seconds.  Neurological:     General: No focal deficit present.     Mental Status: He is alert.  Examination of the right knee demonstrates well-healed surgical incision over the medial aspect of the knee.  Patient's knee  is stable to valgus stress at 0 and 30 degrees.  Anterior posterior instability is present.  Pedal pulses palpable on the right with intact ankle dorsiflexion and plantarflexion strength.  Compartments are soft.  On the left-hand side there is laxity to varus stress at 0 and 30 degrees.  There is also posterior lateral rotatory instability with intact ACL and PCL on the left-hand side.  Pedal pulses palpable with intact ankle dorsiflexion strength present.  Assessment/Plan Impression is bilateral knee multi ligament instability.  Right knee feels good  with medial repair performed.  Plan is allograft reconstruction of ACL and PCL.  May also need to address meniscal pathology as indicated.  On the left-hand side we will perform allograft reconstruction of the posterolateral corner and lateral collateral ligament.  We will also do arthroscopy to evaluate possible lateral meniscal tear.  Risk and benefits of the procedure discussed with pain including not limited to infection nerve vessel damage knee stiffness as well as a period of nonweightbearing.  Patient understands risk and benefits.  All questions answered  Burnard Bunting, MD 10/08/2021, 11:42 PM

## 2021-10-09 NOTE — Addendum Note (Signed)
Addendum  created 10/09/21 1941 by Dorris Singh, MD   Attestation recorded in Intraprocedure, Intraprocedure Attestations filed

## 2021-10-12 NOTE — Discharge Summary (Signed)
Physician Discharge Summary      Patient ID: Devin CatchingsDana Carlton Jones MRN: [id number] DOB/AGE: [birth year] 56 y.o.  Admit date: 09/28/2021 Discharge date: 10/02/21  Admission Diagnoses:  Principal Problem:   S/P ACL reconstruction   Discharge Diagnoses:  Same  Surgeries: Procedure(s): ANTERIOR CRUCIATE LIGAMENT (ACL) & POSTERIOR CRUCIATE LIGAMENT (PCL) REPAIR LATERAL COLLATERAL LIGAMENT 7 POSTERIOR LATERAL CORNER REPAIR HARDWARE REMOVAL on 09/28/2021   Consultants:   Discharged Condition: Stable  Hospital Course: Devin Jones is an 56 y.o. male who was admitted 09/28/2021 with a chief complaint of bilateral knee instability, and found to have a diagnosis of right knee ACL and PCL tears with left knee posterolateral corner injury and meniscus tear.  They were brought to the operating room on 09/28/2021 and underwent the above named procedures.  Pt awoke from anesthesia without complication and was transferred to the floor. On POD1, patient's pain was increased but overall controlled. He struggled with mobilizing and transferring with PT for several days but did progress to the point that he felt comfortable with discharge on 1/16.  HE was discharged to his brother's house to stay with him and will remain NWB due to his prior pelvic surgery.  Dr. Magdalene PatriciaHandy's contact information was provided to him.  Pt will f/u with Dr. August Saucerean in clinic in ~1 weeks.   Antibiotics given:  Anti-infectives (From admission, onward)    Start     Dose/Rate Route Frequency Ordered Stop   09/29/21 0030  ceFAZolin (ANCEF) IVPB 2g/100 mL premix        2 g 200 mL/hr over 30 Minutes Intravenous Every 8 hours 09/28/21 2109 09/30/21 2332   09/28/21 1353  vancomycin (VANCOCIN) powder  Status:  Discontinued          As needed 09/28/21 1354 09/28/21 1938     .  Recent vital signs:  Vitals:   10/02/21 0635 10/02/21 0917  BP: 109/71 121/73  Pulse: 82 81  Resp: 16 18  Temp: 97.7 F (36.5 C) 98 F (36.7 C)  SpO2:  99% 99%    Recent laboratory studies:  Results for orders placed or performed during the hospital encounter of 09/28/21  Basic metabolic panel  Result Value Ref Range   Sodium 137 135 - 145 mmol/L   Potassium 4.3 3.5 - 5.1 mmol/L   Chloride 104 98 - 111 mmol/L   CO2 22 22 - 32 mmol/L   Glucose, Bld 146 (H) 70 - 99 mg/dL   BUN 11 6 - 20 mg/dL   Creatinine, Ser 4.090.77 0.61 - 1.24 mg/dL   Calcium 8.7 (L) 8.9 - 10.3 mg/dL   GFR, Estimated >81>60 >19>60 mL/min   Anion gap 11 5 - 15  CBC  Result Value Ref Range   WBC 8.1 4.0 - 10.5 K/uL   RBC 4.10 (L) 4.22 - 5.81 MIL/uL   Hemoglobin 12.4 (L) 13.0 - 17.0 g/dL   HCT 14.738.8 (L) 82.939.0 - 56.252.0 %   MCV 94.6 80.0 - 100.0 fL   MCH 30.2 26.0 - 34.0 pg   MCHC 32.0 30.0 - 36.0 g/dL   RDW 13.014.3 86.511.5 - 78.415.5 %   Platelets 261 150 - 400 K/uL   nRBC 0.0 0.0 - 0.2 %  CBC  Result Value Ref Range   WBC 9.6 4.0 - 10.5 K/uL   RBC 4.46 4.22 - 5.81 MIL/uL   Hemoglobin 13.2 13.0 - 17.0 g/dL   HCT 69.642.0 29.539.0 - 28.452.0 %   MCV 94.2 80.0 - 100.0  fL   MCH 29.6 26.0 - 34.0 pg   MCHC 31.4 30.0 - 36.0 g/dL   RDW 16.1 09.6 - 04.5 %   Platelets 253 150 - 400 K/uL   nRBC 0.0 0.0 - 0.2 %  Creatinine, serum  Result Value Ref Range   Creatinine, Ser 0.98 0.61 - 1.24 mg/dL   GFR, Estimated >40 >98 mL/min  CBC  Result Value Ref Range   WBC 7.0 4.0 - 10.5 K/uL   RBC 3.75 (L) 4.22 - 5.81 MIL/uL   Hemoglobin 11.6 (L) 13.0 - 17.0 g/dL   HCT 11.9 (L) 14.7 - 82.9 %   MCV 92.3 80.0 - 100.0 fL   MCH 30.9 26.0 - 34.0 pg   MCHC 33.5 30.0 - 36.0 g/dL   RDW 56.2 13.0 - 86.5 %   Platelets 214 150 - 400 K/uL   nRBC 0.0 0.0 - 0.2 %    Discharge Medications:   Allergies as of 10/02/2021   No Known Allergies      Medication List     STOP taking these medications    Senexon-S 8.6-50 MG tablet Generic drug: senna-docusate       TAKE these medications    acetaminophen 325 MG tablet Commonly known as: TYLENOL Take 2 tablets (650 mg total) by mouth every 6 (six) hours.    CertaVite/Antioxidants Tabs Take 1 tablet by mouth daily.   enoxaparin 40 MG/0.4ML injection Commonly known as: LOVENOX Inject 1 syringe (40 mg total) into the skin daily.   folic acid 1 MG tablet Commonly known as: FOLVITE Take 1 tablet (1 mg total) by mouth daily.   gabapentin 300 MG capsule Commonly known as: NEURONTIN Take 1 capsule (300 mg total) by mouth 3 (three) times daily.   methocarbamol 500 MG tablet Commonly known as: ROBAXIN Take 1 tablet (500 mg total) by mouth every 8 (eight) hours as needed for muscle spasms. What changed: when to take this   oxyCODONE 5 MG immediate release tablet Commonly known as: Oxy IR/ROXICODONE Take 1 tablet (5 mg total) by mouth every 8 (eight) hours as needed for moderate pain (pain score 4-6). What changed:  medication strength how much to take when to take this reasons to take this   polyethylene glycol powder 17 GM/SCOOP powder Commonly known as: GLYCOLAX/MIRALAX Take 17 g by mouth 2 (two) times daily.   selenium sulfide 1 % Lotn Commonly known as: SELSUN Apply 1 application topically daily. To hair on face and scalp   thiamine 100 MG tablet Take 1 tablet (100 mg total) by mouth daily.   V-R GAS RELIEF 80 MG chewable tablet Generic drug: simethicone Chew 1 tablet (80 mg total) by mouth 4 (four) times daily as needed for flatulence.   vitamin C 500 MG tablet Commonly known as: ASCORBIC ACID Take 1 tablet (500 mg total) by mouth daily.   Vitamin D 50 MCG (2000 UT) tablet Take 1 tablet (2,000 Units total) by mouth 2 (two) times daily.        Diagnostic Studies: DG Knee 1-2 Views Right  Result Date: 09/28/2021 CLINICAL DATA:  Fluoroscopic assistance for ACL repair EXAM: RIGHT KNEE - 1-2 VIEW COMPARISON:  None. FINDINGS: Fluoroscopic images show a metallic anchor in the distal femur, possibly used for cruciate ligament repair. Fluoroscopic time is 46 seconds. Radiation dose is 1.67 mGy. IMPRESSION: Fluoroscopic  assistance was provided for cruciate ligament repair. Electronically Signed   By: Ernie Avena M.D.   On: 09/28/2021 16:33  DG C-Arm 1-60 Min-No Report  Result Date: 09/28/2021 Fluoroscopy was utilized by the requesting physician.  No radiographic interpretation.   DG C-Arm 1-60 Min-No Report  Result Date: 09/28/2021 Fluoroscopy was utilized by the requesting physician.  No radiographic interpretation.   DG C-Arm 1-60 Min-No Report  Result Date: 09/28/2021 Fluoroscopy was utilized by the requesting physician.  No radiographic interpretation.    Disposition: Discharge disposition: 01-Home or Self Care       Discharge Instructions     Call MD / Call 911   Complete by: As directed    If you experience chest pain or shortness of breath, CALL 911 and be transported to the hospital emergency room.  If you develope a fever above 101 F, pus (white drainage) or increased drainage or redness at the wound, or calf pain, call your surgeon's office.   Constipation Prevention   Complete by: As directed    Drink plenty of fluids.  Prune juice may be helpful.  You may use a stool softener, such as Colace (over the counter) 100 mg twice a day.  Use MiraLax (over the counter) for constipation as needed.   Diet - low sodium heart healthy   Complete by: As directed    Discharge instructions   Complete by: As directed    Nonweightbearing to both legs and right arm.  Starting tomorrow, start working on knee flexion. Use the bledsoe brace at all times for the left knee.  Only use the knee immobilizer when transfering for the right leg. Work on getting the leg to straighten fully as well. Do not keep a pillow under your knee, keep a pillow under your heel instead.  Leave the water-proof dressing on.  You may shower with that waterproof dressing on but do not soak in a tub/pool.  We will remove the waterproof dressing at your postop appointment.   Begin straight-leg raises tomorrow, and aim for 3  sets of 10 reps.  It is normal if you cannot do any straight leg raises for several days after surgery.   Call the office with any questions at [phone].  Follow-up with Dr. August Saucerean in clinic in 7 days at your given appointment date. Call [facility] Donath(Nathan) at [phone] if you would like CPM but if not then we can decide at first postop appointment if we need one.  Call Dr. Magdalene PatriciaHandy's office at the attached number to schedule postop appointment with him.   Increase activity slowly as tolerated   Complete by: As directed    Post-operative opioid taper instructions:   Complete by: As directed    POST-OPERATIVE OPIOID TAPER INSTRUCTIONS: It is important to wean off of your opioid medication as soon as possible. If you do not need pain medication after your surgery it is ok to stop day one. Opioids include: Codeine, Hydrocodone(Norco, Vicodin), Oxycodone(Percocet, oxycontin) and hydromorphone amongst others.  Long term and even short term use of opiods can cause: Increased pain response Dependence Constipation Depression Respiratory depression And more.  Withdrawal symptoms can include Flu like symptoms Nausea, vomiting And more Techniques to manage these symptoms Hydrate well Eat regular healthy meals Stay active Use relaxation techniques(deep breathing, meditating, yoga) Do Not substitute Alcohol to help with tapering If you have been on opioids for less than two weeks and do not have pain than it is ok to stop all together.  Plan to wean off of opioids This plan should start within one week post op of your joint replacement. Maintain the  same interval or time between taking each dose and first decrease the dose.  Cut the total daily intake of opioids by one tablet each day Next start to increase the time between doses. The last dose that should be eliminated is the evening dose.           Follow-up Information     Myrene Galas, MD Follow up.   Specialty: Orthopedic  Surgery Contact information: [address] [place] [address] [phone]                  Signed: Julieanne Cotton 10/12/2021, 6:54 AM

## 2021-10-13 ENCOUNTER — Other Ambulatory Visit: Payer: Self-pay

## 2021-10-13 ENCOUNTER — Encounter: Payer: Self-pay | Admitting: Nurse Practitioner

## 2021-10-13 ENCOUNTER — Ambulatory Visit: Payer: Medicaid Other | Attending: Nurse Practitioner | Admitting: Nurse Practitioner

## 2021-10-13 DIAGNOSIS — T07XXXA Unspecified multiple injuries, initial encounter: Secondary | ICD-10-CM | POA: Diagnosis not present

## 2021-10-13 DIAGNOSIS — Z7689 Persons encountering health services in other specified circumstances: Secondary | ICD-10-CM

## 2021-10-13 DIAGNOSIS — S069X2S Unspecified intracranial injury with loss of consciousness of 31 minutes to 59 minutes, sequela: Secondary | ICD-10-CM
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
## 2021-10-17 ENCOUNTER — Telehealth: Payer: Self-pay | Admitting: Physical Medicine & Rehabilitation

## 2021-10-17 NOTE — Telephone Encounter (Signed)
Patient is hospital follow up is having challenges finding a ride, Rescheduled to April 5 he is wanting to know if he should be coming early to see NP or if 4/5 is alright. Please advise if we need to move to sooner date.  Also asked about virtual visit.  Did advise we typically need to see our patients in person when following up from Hospital.

## 2021-10-18 ENCOUNTER — Encounter: Payer: Self-pay | Admitting: Physical Medicine & Rehabilitation

## 2021-10-20 ENCOUNTER — Encounter: Payer: Self-pay | Admitting: Orthopedic Surgery

## 2021-10-20 ENCOUNTER — Ambulatory Visit (INDEPENDENT_AMBULATORY_CARE_PROVIDER_SITE_OTHER): Payer: Self-pay | Admitting: Orthopedic Surgery

## 2021-10-20 ENCOUNTER — Other Ambulatory Visit: Payer: Self-pay

## 2021-10-20 DIAGNOSIS — Z9889 Other specified postprocedural states: Secondary | ICD-10-CM

## 2021-10-20 DIAGNOSIS — S83104D Unspecified dislocation of right knee, subsequent encounter: Secondary | ICD-10-CM
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
## 2021-10-21 DIAGNOSIS — Z969 Presence of functional implant, unspecified: Secondary | ICD-10-CM

## 2021-10-21 DIAGNOSIS — S83272A Complex tear of lateral meniscus, current injury, left knee, initial encounter: Secondary | ICD-10-CM

## 2021-10-21 DIAGNOSIS — S83511A Sprain of anterior cruciate ligament of right knee, initial encounter: Secondary | ICD-10-CM

## 2021-10-21 DIAGNOSIS — S83521A Sprain of posterior cruciate ligament of right knee, initial encounter: Secondary | ICD-10-CM

## 2021-10-21 DIAGNOSIS — S83422D Sprain of lateral collateral ligament of left knee, subsequent encounter: Secondary | ICD-10-CM

## 2021-10-21 DIAGNOSIS — S8992XD Unspecified injury of left lower leg, subsequent encounter: Secondary | ICD-10-CM

## 2021-10-27 ENCOUNTER — Telehealth: Payer: Self-pay | Admitting: Orthopedic Surgery

## 2021-10-27 DIAGNOSIS — Z9889 Other specified postprocedural states: Secondary | ICD-10-CM

## 2021-10-27 DIAGNOSIS — S83104D Unspecified dislocation of right knee, subsequent encounter: Secondary | ICD-10-CM

## 2021-10-27 NOTE — Telephone Encounter (Signed)
Patient called. He would like a referral for PT put in Epic for [facility] [address].

## 2021-10-27 NOTE — Telephone Encounter (Signed)
Order placed

## 2021-10-27 NOTE — Telephone Encounter (Signed)
Ok for rom as tolerated bil knees no open chain 2 - 3 x week 6 w

## 2021-10-31 ENCOUNTER — Other Ambulatory Visit: Payer: Self-pay

## 2021-10-31 ENCOUNTER — Encounter: Payer: Self-pay | Admitting: Physical Therapy

## 2021-10-31 ENCOUNTER — Ambulatory Visit: Payer: Medicaid Other | Attending: Orthopedic Surgery | Admitting: Physical Therapy

## 2021-10-31 DIAGNOSIS — M25561 Pain in right knee: Secondary | ICD-10-CM | POA: Diagnosis present

## 2021-10-31 DIAGNOSIS — S83104D Unspecified dislocation of right knee, subsequent encounter: Secondary | ICD-10-CM | POA: Diagnosis not present

## 2021-10-31 DIAGNOSIS — M25562 Pain in left knee: Secondary | ICD-10-CM | POA: Insufficient documentation

## 2021-10-31 DIAGNOSIS — R2689 Other abnormalities of gait and mobility: Secondary | ICD-10-CM | POA: Diagnosis present

## 2021-10-31 DIAGNOSIS — Z9889 Other specified postprocedural states: Secondary | ICD-10-CM | POA: Insufficient documentation

## 2021-10-31 DIAGNOSIS — G8929 Other chronic pain: Secondary | ICD-10-CM | POA: Diagnosis present

## 2021-10-31 DIAGNOSIS — M6281 Muscle weakness (generalized): Secondary | ICD-10-CM | POA: Diagnosis present

## 2021-10-31 NOTE — Patient Instructions (Signed)
Access Code: [email or web address] URL: [email or web address] Date: 10/31/2021 Prepared by: Rosana Hoes  Exercises Long Sitting Quad Set - 5 x daily - 7 x weekly - 10 reps - 5 seconds hold Supine Straight Leg Raise with Elbow Support - 2-3 x daily - 7 x weekly - 2 sets - 10 reps Long Sitting Calf Stretch with Strap - 2-3 x daily - 7 x weekly - 3 reps - 30 seconds hold Hooklying Hamstring Stretch with Strap - 2-3 x daily - 7 x weekly - 3 reps - 30 seconds hold Sitting Heel Slide with Towel - 2-3 x daily - 7 x weekly - 10 reps - 5-10 seconds hold Long Sitting 4 Way Patellar Glide - 2 x daily - 7 x weekly - 20 reps Sidelying Hip Abduction - 2-3 x daily - 7 x weekly - 2 sets - 10 reps Side to Side Weight Shift with Counter Support - 2-3 x daily - 7 x weekly - 20 reps - 5 seconds hold
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
## 2021-11-01 NOTE — Therapy (Incomplete)
OUTPATIENT PHYSICAL THERAPY TREATMENT NOTE   Patient Name: Devin Jones MRN: [id number] DOB:1966/01/03, 56 y.o., male Today's Date: 11/01/2021  PCP: Claiborne Rigg, NP REFERRING PROVIDER: Cammy Copa, MD    Past Medical History:  Diagnosis Date   Alcohol abuse    Past Surgical History:  Procedure Laterality Date   ANTERIOR CRUCIATE LIGAMENT REPAIR Right 09/28/2021   Procedure: ANTERIOR CRUCIATE LIGAMENT (ACL) & POSTERIOR CRUCIATE LIGAMENT (PCL) REPAIR;  Surgeon: Cammy Copa, MD;  Location: [facility] OR;  Service: Orthopedics;  Laterality: Right;   COSMETIC SURGERY     HARDWARE REMOVAL Right 09/28/2021   Procedure: HARDWARE REMOVAL;  Surgeon: Cammy Copa, MD;  Location: [facility] OR;  Service: Orthopedics;  Laterality: Right;   MEDIAL COLLATERAL LIGAMENT REPAIR, KNEE Right 08/31/2021   Procedure: RIGHT KNEE MEDIAL COLLATERAL LIGAMENT REPAIR/RECONSTRUCTION (ALL OPEN);  Surgeon: Cammy Copa, MD;  Location: [facility] OR;  Service: Orthopedics;  Laterality: Right;   ORIF RADIAL FRACTURE Right 08/28/2021   Procedure: OPEN REDUCTION INTERNAL FIXATION (ORIF) RADIAL FRACTURE;  Surgeon: Myrene Galas, MD;  Location: [facility] OR;  Service: Orthopedics;  Laterality: Right;   SACRO-ILIAC PINNING Right 08/28/2021   Procedure: SACRO-ILIAC SCREW FIXATION WITH  ANTERIOR PELVIC REPAIR;  Surgeon: Myrene Galas, MD;  Location: [facility] OR;  Service: Orthopedics;  Laterality: Right;   Patient Active Problem List   Diagnosis Date Noted   Rupture of anterior cruciate ligament of right knee    Rupture of posterior cruciate ligament of right knee    Complex tear of lateral meniscus of left knee as current injury    Injury of posterolateral corner of knee, left, subsequent encounter    Tear of LCL (lateral collateral ligament) of knee, left, subsequent encounter    Retained orthopedic hardware    Hyperglycemia 10/02/2021   Hypoalbuminemia 10/02/2021   Elevated alkaline phosphatase level  10/02/2021   S/P ACL reconstruction 09/28/2021   Right knee dislocation, subsequent encounter    TBI (traumatic brain injury) 09/07/2021   Multiple trauma 09/07/2021   Pelvic fracture (HCC) 08/26/2021    REFERRING DIAG: Right knee dislocation, Status post left knee surgery  THERAPY DIAG:  No diagnosis found.  PERTINENT HISTORY: Right knee open MCL reconstruction on 08/28/2021, ACL/PCL reconstruction on 09/28/2020 Left knee LCL and lateral corner reconstruction on 09/28/2020 Unstable pelvic ring fracture s/p ORIF and SI screws (L to R) on 08/28/2021 Right galeazzi fracture dislocation (radius) s/p ORIF 08/28/2021  PRECAUTIONS: Per surgeon ROM as tolerated, no open chain exercises; WBAT  SUBJECTIVE: ***  PAIN:  Are you having pain? {yes/no:20286} NPRS scale: ***/10 Pain location: *** Pain orientation: {Pain Orientation:25161}  PAIN TYPE: {type:313116} Pain description: {PAIN DESCRIPTION:21022940}  Aggravating factors: *** Relieving factors: ***   OBJECTIVE:  *Unless otherwise noted all objective measures were captured on initial evaluation.   MUSCLE LENGTH: Slight limitation of bilateral calf and hamstring, quad flexibility not assessed on evaluation   PALPATION: Non tender to palpation   LE AROM:   AROM Right 10/31/2021 Left 10/31/2021  Knee flexion 105 120  Knee extension 0 0    LE MMT:                     Formal strength assessment deferred this visit Patient demonstrates ability to activate quad well, but with slight extension lag with SLR and fatigues quickly.   LOWER EXTREMITY SPECIAL TESTS:  Not assessed   FUNCTIONAL TESTS:  Patient able to perform sit to stand from mat table using rolling  walker for assist, CGA for steadying in standing position   GAIT: Distance walked: N/A Assistive device utilized: Environmental consultant - 2 wheeled Level of assistance: CGA Comments: sit to stand    [facility] Adult PT Treatment:                                                DATE:  11/02/21 Therapeutic Exercise: *** Manual Therapy: *** Neuromuscular re-ed: *** Therapeutic Activity: *** Modalities: *** Self Care: ***  TREATMENT 10/31/21: Longsitting quad set 10 x 5 sec hold each SLR propped on elbows x 10 each - patient demonstrates slight extension lag with endurance deficit Longsitting calf stretch with strap x 30 sec each Supine calf stretch with strap x 30 sec each Longsitting heel slide with strap 5 x 5 sec each Longsitting patellar mobilizations self performed by patient Sidelying hip abduction x 10 each Sit<>stand from table using rolling walker x 1 Standing weight shifts inside rolling walk 5 x 5 sec each   PATIENT EDUCATION:  Education details:  Person educated: Patient Education method: Programmer, multimedia, Facilities manager, Actor cues, Verbal cues, and Handouts Education comprehension: verbalized understanding, returned demonstration, verbal cues required, tactile cues required, and needs further education   HOME EXERCISE PROGRAM: Access Code: [email or web address]     ASSESSMENT: CLINICAL IMPRESSION:   Objective impairments include Abnormal gait, decreased balance, difficulty walking, decreased ROM, decreased strength, increased edema, impaired flexibility, improper body mechanics, and pain.    REHAB POTENTIAL: Good   CLINICAL DECISION MAKING: Evolving/moderate complexity   EVALUATION COMPLEXITY: Moderate     GOALS: Goals reviewed with patient? Yes   SHORT TERM GOALS:   STG Name Target Date Goal status  1 Patient will be I with initial HEP in order to progress with therapy. Baseline: HEP provided at eval 11/28/2021 INITIAL  2 Patient will demonstrate SLR without extension lag to indicate improve quad control/strength Baseline: patient exhibits slight extension lag and endurance deficit with SLR 11/28/2021 INITIAL  3 Patient will achieve >/= 120 deg knee flexion AROM bilaterally to improve transfer ability Baseline: 105 deg right knee flexion  11/28/2021 INITIAL  4 Patient will be able to ambulate household distances using LRAD in order to improve independence and ability to perform self care tasks Baseline: patient is using wheel chair for all mobility 11/28/2021 INITIAL    LONG TERM GOALS:    LTG Name Target Date Goal status  1 Patient will be I with final HEP to maintain progress from PT. Baseline: HEP provided at eval 12/26/2021 INITIAL  2 Patient will be able to ambulate community level distances with LRAD in order to improve community access and ability to return to work Baseline: patient using wheel chair for all mobility at evaluation 12/26/2021 INITIAL  3 Patient will exhibit bilateral knee AROM grossly WFL and non-painful to allow for ability to return to cycling Baseline: patient demonstrates limitations with knee AROM 12/26/2021 INITIAL  4 Patient will demonstrate bilateral knee strength 5/5 MMT in order to improve ability to squat/stoop to perform work related tasks Baseline: not formally assessed at evaluation, he demonstrates gross strength deficit with decreased quad control 12/26/2021 INITIAL  5 Patient will report </= 2/10 pain with all activity in order to reduce functional limitation and allow for return to prior level of function Baseline: patient reports increased pain with activity 12/26/2021 INITIAL      PLAN:  PT FREQUENCY: 2x/week   PT DURATION: 8 weeks   PLANNED INTERVENTIONS: Therapeutic exercises, Therapeutic activity, Neuro Muscular re-education, Balance training, Gait training, Patient/Family education, Joint mobilization, Aquatic Therapy, Dry Needling, Electrical stimulation, Cryotherapy, Moist heat, scar mobilization, Taping, Vasopneumatic device, Ionotophoresis 4mg /ml Dexamethasone, and Manual therapy   PLAN FOR NEXT SESSION: Review HEP and progress PRN, standing/gait training in parallel bars progressing to using rolling walker as able, progress knee flexion ROM, continue quad strengthening and  control   Letitia Libra, PT, DPT, ATC 11/01/21 1:42 PM

## 2021-11-02 ENCOUNTER — Other Ambulatory Visit: Payer: Self-pay

## 2021-11-02 ENCOUNTER — Ambulatory Visit: Payer: Medicaid Other

## 2021-11-02 DIAGNOSIS — G8929 Other chronic pain: Secondary | ICD-10-CM

## 2021-11-02 DIAGNOSIS — M25561 Pain in right knee: Secondary | ICD-10-CM | POA: Diagnosis not present

## 2021-11-02 DIAGNOSIS — M6281 Muscle weakness (generalized): Secondary | ICD-10-CM

## 2021-11-02 DIAGNOSIS — R2689 Other abnormalities of gait and mobility: Secondary | ICD-10-CM

## 2021-11-02 NOTE — Therapy (Signed)
OUTPATIENT PHYSICAL THERAPY TREATMENT NOTE   Patient Name: Devin Jones MRN: [id number] DOB:21-Jul-1966, 56 y.o., male Today's Date: 11/02/2021  PCP: Claiborne Rigg, NP REFERRING PROVIDER: Cammy Copa, MD   PT End of Session - 11/02/21 1543     Visit Number 2    Number of Visits 17    Date for PT Re-Evaluation 12/26/21    Authorization Type Self Pay (MCD Pending)    PT Start Time 1542   Pt arrived 12 minutes late to his appointment.   PT Stop Time 1638   10 minutes vasopneumatic treatment.   PT Time Calculation (min) 56 min    Equipment Utilized During Treatment Gait belt;Left knee immobilizer    Activity Tolerance Patient tolerated treatment well    Behavior During Therapy WFL for tasks assessed/performed             Past Medical History:  Diagnosis Date   Alcohol abuse    Past Surgical History:  Procedure Laterality Date   ANTERIOR CRUCIATE LIGAMENT REPAIR Right 09/28/2021   Procedure: ANTERIOR CRUCIATE LIGAMENT (ACL) & POSTERIOR CRUCIATE LIGAMENT (PCL) REPAIR;  Surgeon: Cammy Copa, MD;  Location: [facility] OR;  Service: Orthopedics;  Laterality: Right;   COSMETIC SURGERY     HARDWARE REMOVAL Right 09/28/2021   Procedure: HARDWARE REMOVAL;  Surgeon: Cammy Copa, MD;  Location: [facility] OR;  Service: Orthopedics;  Laterality: Right;   MEDIAL COLLATERAL LIGAMENT REPAIR, KNEE Right 08/31/2021   Procedure: RIGHT KNEE MEDIAL COLLATERAL LIGAMENT REPAIR/RECONSTRUCTION (ALL OPEN);  Surgeon: Cammy Copa, MD;  Location: [facility] OR;  Service: Orthopedics;  Laterality: Right;   ORIF RADIAL FRACTURE Right 08/28/2021   Procedure: OPEN REDUCTION INTERNAL FIXATION (ORIF) RADIAL FRACTURE;  Surgeon: Myrene Galas, MD;  Location: [facility] OR;  Service: Orthopedics;  Laterality: Right;   SACRO-ILIAC PINNING Right 08/28/2021   Procedure: SACRO-ILIAC SCREW FIXATION WITH  ANTERIOR PELVIC REPAIR;  Surgeon: Myrene Galas, MD;  Location: [facility] OR;  Service: Orthopedics;   Laterality: Right;   Patient Active Problem List   Diagnosis Date Noted   Rupture of anterior cruciate ligament of right knee    Rupture of posterior cruciate ligament of right knee    Complex tear of lateral meniscus of left knee as current injury    Injury of posterolateral corner of knee, left, subsequent encounter    Tear of LCL (lateral collateral ligament) of knee, left, subsequent encounter    Retained orthopedic hardware    Hyperglycemia 10/02/2021   Hypoalbuminemia 10/02/2021   Elevated alkaline phosphatase level 10/02/2021   S/P ACL reconstruction 09/28/2021   Right knee dislocation, subsequent encounter    TBI (traumatic brain injury) 09/07/2021   Multiple trauma 09/07/2021   Pelvic fracture (HCC) 08/26/2021    REFERRING DIAG: Right knee dislocation, Status post left knee surgery  THERAPY DIAG:  Chronic pain of right knee  Chronic pain of left knee  Muscle weakness (generalized)  Other abnormalities of gait and mobility  PERTINENT HISTORY: Right knee open MCL reconstruction on 08/28/2021, ACL/PCL reconstruction on 09/28/2020 Left knee LCL and lateral corner reconstruction on 09/28/2020 Unstable pelvic ring fracture s/p ORIF and SI screws (L to R) on 08/28/2021 Right galeazzi fracture dislocation (radius) s/p ORIF 08/28/2021  PRECAUTIONS: Per surgeon ROM as tolerated, no open chain exercises; WBAT  SUBJECTIVE: Pt reports that his HEP is challenging, but that he has been doing his exercises daily. He denies any pain at this time.  PAIN:  Are you having pain? No NPRS scale:  0/10    OBJECTIVE:  *Unless otherwise noted all objective measures were captured on initial evaluation.   MUSCLE LENGTH: Slight limitation of bilateral calf and hamstring, quad flexibility not assessed on evaluation   PALPATION: Non tender to palpation   LE AROM:   AROM Right 10/31/2021 Left 10/31/2021 Right 11/02/2021 Left 11/02/2021  Knee flexion 105 120 105 120  Knee extension 0  0      LE MMT:                     Formal strength assessment deferred this visit Patient demonstrates ability to activate quad well, but with slight extension lag with SLR and fatigues quickly.   LOWER EXTREMITY SPECIAL TESTS:  Not assessed   FUNCTIONAL TESTS:  Patient able to perform sit to stand from mat table using rolling walker for assist, CGA for steadying in standing position  11/02/2021: : 365 ft with no seated rest (1.0 ft/sec gait speed)   GAIT: Distance walked: N/A Assistive device utilized: Environmental consultant - 2 wheeled Level of assistance: CGA Comments: sit to stand    [facility] Adult PT Treatment:                                                DATE: 11/02/21 Therapeutic Exercise: Standing hip extension in FWW 2x10 BIL Standing hip abduction in FWW 2x10 BIL Standing high marches in FWW 2x10 BIL Supine SLR with hip abduction 2x10 on Lt Supine Lt knee flexion AAROM with strap 2x10 with 5-sec hold at end range Manual Therapy: N/A Neuromuscular re-ed: N/A Therapeutic Activity: Sit-to-stand with FWW 2x5 with FWW Modalities: GameReady Vasopneumatic treatment of Rt knee x10 minutes at 34 degrees F Self Care: N/A  TREATMENT 10/31/21: Longsitting quad set 10 x 5 sec hold each SLR propped on elbows x 10 each - patient demonstrates slight extension lag with endurance deficit Longsitting calf stretch with strap x 30 sec each Supine calf stretch with strap x 30 sec each Longsitting heel slide with strap 5 x 5 sec each Longsitting patellar mobilizations self performed by patient Sidelying hip abduction x 10 each Sit<>stand from table using rolling walker x 1 Standing weight shifts inside rolling walk 5 x 5 sec each   PATIENT EDUCATION:  Education details:  Person educated: Patient Education method: Programmer, multimedia, Facilities manager, Actor cues, Verbal cues, and Handouts Education comprehension: verbalized understanding, returned demonstration, verbal cues required, tactile cues  required, and needs further education   HOME EXERCISE PROGRAM: Access Code: [email or web address]     ASSESSMENT: CLINICAL IMPRESSION: Pt responded well to all interventions today, demonstrating the ability to perform sit-to-stand transfers and a with a FWW. Discussed with pt the benefits of transitioning from the use of a MWC to a FWW for ambulation, to which he reports understanding and agreement. He was able to perform SLR with abduction with minimal quad lag today, as well. Pt reports a therapeutic response to vasopneumatic treatment today and has visually improved swelling of the Rt knee. He will continue to benefit from skilled PT to address his primary impairments and return to his prior level of function with less limitation.  Objective impairments include Abnormal gait, decreased balance, difficulty walking, decreased ROM, decreased strength, increased edema, impaired flexibility, improper body mechanics, and pain.    REHAB POTENTIAL: Good   CLINICAL DECISION MAKING: Evolving/moderate complexity  EVALUATION COMPLEXITY: Moderate     GOALS: Goals reviewed with patient? Yes   SHORT TERM GOALS:   STG Name Target Date Goal status  1 Patient will be I with initial HEP in order to progress with therapy. Baseline: HEP provided at eval 11/02/2021: Pt reports daily adherence to his HEP 11/28/2021 ACHIEVED  2 Patient will demonstrate SLR without extension lag to indicate improve quad control/strength Baseline: patient exhibits slight extension lag and endurance deficit with SLR 11/28/2021 INITIAL  3 Patient will achieve >/= 120 deg knee flexion AROM bilaterally to improve transfer ability Baseline: 105 deg right knee flexion 11/28/2021 INITIAL  4 Patient will be able to ambulate household distances using LRAD in order to improve independence and ability to perform self care tasks Baseline: patient is using wheel chair for all mobility 11/02/2021: Pt able to ambulate 365 feet in 6 minutes   11/28/2021 ACHIEVED    LONG TERM GOALS:    LTG Name Target Date Goal status  1 Patient will be I with final HEP to maintain progress from PT. Baseline: HEP provided at eval 12/26/2021 INITIAL  2 Patient will be able to ambulate community level distances with LRAD in order to improve community access and ability to return to work Baseline: patient using wheel chair for all mobility at evaluation 12/26/2021 INITIAL  3 Patient will exhibit bilateral knee AROM grossly WFL and non-painful to allow for ability to return to cycling Baseline: patient demonstrates limitations with knee AROM 12/26/2021 INITIAL  4 Patient will demonstrate bilateral knee strength 5/5 MMT in order to improve ability to squat/stoop to perform work related tasks Baseline: not formally assessed at evaluation, he demonstrates gross strength deficit with decreased quad control 12/26/2021 INITIAL  5 Patient will report </= 2/10 pain with all activity in order to reduce functional limitation and allow for return to prior level of function Baseline: patient reports increased pain with activity 12/26/2021 INITIAL      PLAN: PT FREQUENCY: 2x/week   PT DURATION: 8 weeks   PLANNED INTERVENTIONS: Therapeutic exercises, Therapeutic activity, Neuro Muscular re-education, Balance training, Gait training, Patient/Family education, Joint mobilization, Aquatic Therapy, Dry Needling, Electrical stimulation, Cryotherapy, Moist heat, scar mobilization, Taping, Vasopneumatic device, Ionotophoresis 4mg /ml Dexamethasone, and Manual therapy   PLAN FOR NEXT SESSION: Review HEP and progress PRN, standing/gait training in parallel bars progressing to using rolling walker as able, progress knee flexion ROM, continue quad strengthening and control   Carmelina Dane, PT, DPT 11/02/21 4:38 PM

## 2021-11-07 ENCOUNTER — Ambulatory Visit: Payer: Medicaid Other | Admitting: Physical Therapy

## 2021-11-07 ENCOUNTER — Encounter: Payer: Self-pay | Admitting: Physical Therapy

## 2021-11-07 ENCOUNTER — Other Ambulatory Visit: Payer: Self-pay

## 2021-11-07 DIAGNOSIS — G8929 Other chronic pain: Secondary | ICD-10-CM

## 2021-11-07 DIAGNOSIS — R2689 Other abnormalities of gait and mobility: Secondary | ICD-10-CM

## 2021-11-07 DIAGNOSIS — M25561 Pain in right knee: Secondary | ICD-10-CM | POA: Diagnosis not present

## 2021-11-07 DIAGNOSIS — M25562 Pain in left knee: Secondary | ICD-10-CM

## 2021-11-07 DIAGNOSIS — M6281 Muscle weakness (generalized): Secondary | ICD-10-CM

## 2021-11-07 NOTE — Therapy (Signed)
OUTPATIENT PHYSICAL THERAPY TREATMENT NOTE   Patient Name: Devin Jones MRN: [id number] DOB:1966/03/27, 56 y.o., male Today's Date: 11/07/2021  PCP: Claiborne Rigg, NP REFERRING PROVIDER: Claiborne Rigg, NP   PT End of Session - 11/07/21 1448     Visit Number 3    Number of Visits 17    Date for PT Re-Evaluation 12/26/21    Authorization Type Self Pay (MCD Pending)    PT Start Time 1445    PT Stop Time 1530    PT Time Calculation (min) 45 min    Equipment Utilized During Treatment Gait belt;Left knee immobilizer    Activity Tolerance Patient tolerated treatment well    Behavior During Therapy WFL for tasks assessed/performed              Past Medical History:  Diagnosis Date   Alcohol abuse    Past Surgical History:  Procedure Laterality Date   ANTERIOR CRUCIATE LIGAMENT REPAIR Right 09/28/2021   Procedure: ANTERIOR CRUCIATE LIGAMENT (ACL) & POSTERIOR CRUCIATE LIGAMENT (PCL) REPAIR;  Surgeon: Cammy Copa, MD;  Location: [facility] OR;  Service: Orthopedics;  Laterality: Right;   COSMETIC SURGERY     HARDWARE REMOVAL Right 09/28/2021   Procedure: HARDWARE REMOVAL;  Surgeon: Cammy Copa, MD;  Location: [facility] OR;  Service: Orthopedics;  Laterality: Right;   MEDIAL COLLATERAL LIGAMENT REPAIR, KNEE Right 08/31/2021   Procedure: RIGHT KNEE MEDIAL COLLATERAL LIGAMENT REPAIR/RECONSTRUCTION (ALL OPEN);  Surgeon: Cammy Copa, MD;  Location: [facility] OR;  Service: Orthopedics;  Laterality: Right;   ORIF RADIAL FRACTURE Right 08/28/2021   Procedure: OPEN REDUCTION INTERNAL FIXATION (ORIF) RADIAL FRACTURE;  Surgeon: Myrene Galas, MD;  Location: [facility] OR;  Service: Orthopedics;  Laterality: Right;   SACRO-ILIAC PINNING Right 08/28/2021   Procedure: SACRO-ILIAC SCREW FIXATION WITH  ANTERIOR PELVIC REPAIR;  Surgeon: Myrene Galas, MD;  Location: [facility] OR;  Service: Orthopedics;  Laterality: Right;   Patient Active Problem List   Diagnosis Date Noted   Rupture of  anterior cruciate ligament of right knee    Rupture of posterior cruciate ligament of right knee    Complex tear of lateral meniscus of left knee as current injury    Injury of posterolateral corner of knee, left, subsequent encounter    Tear of LCL (lateral collateral ligament) of knee, left, subsequent encounter    Retained orthopedic hardware    Hyperglycemia 10/02/2021   Hypoalbuminemia 10/02/2021   Elevated alkaline phosphatase level 10/02/2021   S/P ACL reconstruction 09/28/2021   Right knee dislocation, subsequent encounter    TBI (traumatic brain injury) 09/07/2021   Multiple trauma 09/07/2021   Pelvic fracture (HCC) 08/26/2021    REFERRING PROVIDER: Cammy Copa, MD  REFERRING DIAG: Right knee dislocation, Status post left knee surgery  THERAPY DIAG:  Chronic pain of right knee  Chronic pain of left knee  Muscle weakness (generalized)  Other abnormalities of gait and mobility  PERTINENT HISTORY: Right knee open MCL reconstruction on 08/28/2021, ACL/PCL reconstruction on 09/28/2020 Left knee LCL and lateral corner reconstruction on 09/28/2020 Unstable pelvic ring fracture s/p ORIF and SI screws (L to R) on 08/28/2021 Right galeazzi fracture dislocation (radius) s/p ORIF 08/28/2021  PRECAUTIONS: Per surgeon ROM as tolerated, no open chain exercises; WBAT  SUBJECTIVE: Pt reports that his HEP is challenging, but that he has been doing his exercises daily. He denies any pain at this time.  PAIN:  Are you having pain? No NPRS scale: 0/10 (increase in pain with activity)  Pain location: Knee Pain orientation: Bilateral PAIN TYPE: Surgical  Pain description: Intermittent, twinge Aggravating factors: Keeping knees static (bent or straight) Relieving factors: Medication  PATIENT GOALS: Get back to walking normally, start cycling again   OBJECTIVE:  *Unless otherwise noted all objective measures were captured on initial evaluation.   MUSCLE LENGTH: Slight  limitation of bilateral calf and hamstring, quad flexibility not assessed on evaluation   PALPATION: Non tender to palpation   LE AROM:   AROM Right 10/31/2021 Left 10/31/2021 Right 11/02/2021 Left 11/02/2021  Knee flexion 105 120 105 120  Knee extension 0 0      LE MMT:                     Formal strength assessment deferred this visit Patient demonstrates ability to activate quad well, but with slight extension lag with SLR and fatigues quickly.   FUNCTIONAL TESTS:  Patient able to perform sit to stand from mat table using rolling walker for assist, CGA for steadying in standing position  : 365 ft with no seated rest (1.0 ft/sec gait speed) - 11/02/2021   GAIT: Distance walked: N/A Assistive device utilized: Environmental consultant - 2 wheeled Level of assistance: CGA Comments: sit to stand    TODAY'S TREATMENT:  11/07/2021:  Therapeutic Exercise: NuStep L6 x 5 min with LE while taking subjective Partial squat in // bars with BUE support 2 x 10  Heel raise in // bars 2 x 10 SLR 2 x 10 Bridge 2 x 10 Sidelying hip abduction x 10, with yellow proximal to knees x 10 Sit to stand with hands on thighs from elevated table 2 x 5 Gait Training: Using RW 2 x 152ft, cues to remain within walker with reduce UE support, smooth heel-toe progression, upright posture with forward gaze Neur Re-ed: Tandem stance in // bars 3 x 30 sec each Side stepping in // bars with occasional UE support   11/02/21 Therapeutic Exercise: Standing hip extension in FWW 2x10 BIL Standing hip abduction in FWW 2x10 BIL Standing high marches in FWW 2x10 BIL Supine SLR with hip abduction 2x10 on Lt Supine Lt knee flexion AAROM with strap 2x10 with 5-sec hold at end range Therapeutic Activity: Sit-to-stand with FWW 2x5 with FWW Modalities: GameReady Vasopneumatic treatment of Rt knee x10 minutes at 34 degrees F  10/31/21: Therapeutic Exercise: Longsitting quad set 10 x 5 sec hold each SLR propped on elbows x  10 each - patient demonstrates slight extension lag with endurance deficit Longsitting calf stretch with strap x 30 sec each Supine calf stretch with strap x 30 sec each Longsitting heel slide with strap 5 x 5 sec each Longsitting patellar mobilizations self performed by patient Sidelying hip abduction x 10 each Sit<>stand from table using rolling walker x 1 Standing weight shifts inside rolling walk 5 x 5 sec each   PATIENT EDUCATION:  Education details: HEP Person educated: Patient Education method: Programmer, multimedia, Facilities manager, Actor cues, Verbal cues, and Handouts Education comprehension: verbalized understanding, returned demonstration, verbal cues required, tactile cues required, and needs further education   HOME EXERCISE PROGRAM: Access Code: [email or web address]     ASSESSMENT: CLINICAL IMPRESSION: Patient tolerated therapy well with no adverse effects. Therapy focused on progress walking ability, strength and balance in order to improve weight bearing tolerance mobility. He did require occasional cueing for gait using RW, but able to perform with minimal need of BUE for support and with improved heel toe progression. Patient able to progress with  his strengthening exercises and initiated balance training with patient demonstrating increased sway and occasional BUE support. Patient would benefit from continued skilled PT to progress his mobility and strength to return to walking and prior activity level without limitation.  Objective impairments include Abnormal gait, decreased balance, difficulty walking, decreased ROM, decreased strength, increased edema, impaired flexibility, improper body mechanics, and pain.      GOALS: Goals reviewed with patient? Yes   SHORT TERM GOALS:   STG Name Target Date Goal status  1 Patient will be I with initial HEP in order to progress with therapy. Baseline: HEP provided at eval 11/02/2021: Pt reports daily adherence to his HEP 11/28/2021 ACHIEVED  2  Patient will demonstrate SLR without extension lag to indicate improve quad control/strength Baseline: patient exhibits slight extension lag and endurance deficit with SLR 11/28/2021 INITIAL  3 Patient will achieve >/= 120 deg knee flexion AROM bilaterally to improve transfer ability Baseline: 105 deg right knee flexion 11/28/2021 INITIAL  4 Patient will be able to ambulate household distances using LRAD in order to improve independence and ability to perform self care tasks Baseline: patient is using wheel chair for all mobility 11/02/2021: Pt able to ambulate 365 feet in 6 minutes  11/28/2021 ACHIEVED    LONG TERM GOALS:    LTG Name Target Date Goal status  1 Patient will be I with final HEP to maintain progress from PT. Baseline: HEP provided at eval 12/26/2021 INITIAL  2 Patient will be able to ambulate community level distances with LRAD in order to improve community access and ability to return to work Baseline: patient using wheel chair for all mobility at evaluation 12/26/2021 INITIAL  3 Patient will exhibit bilateral knee AROM grossly WFL and non-painful to allow for ability to return to cycling Baseline: patient demonstrates limitations with knee AROM 12/26/2021 INITIAL  4 Patient will demonstrate bilateral knee strength 5/5 MMT in order to improve ability to squat/stoop to perform work related tasks Baseline: not formally assessed at evaluation, he demonstrates gross strength deficit with decreased quad control 12/26/2021 INITIAL  5 Patient will report </= 2/10 pain with all activity in order to reduce functional limitation and allow for return to prior level of function Baseline: patient reports increased pain with activity 12/26/2021 INITIAL      PLAN: PT FREQUENCY: 2x/week   PT DURATION: 8 weeks   PLANNED INTERVENTIONS: Therapeutic exercises, Therapeutic activity, Neuro Muscular re-education, Balance training, Gait training, Patient/Family education, Joint mobilization, Aquatic  Therapy, Dry Needling, Electrical stimulation, Cryotherapy, Moist heat, scar mobilization, Taping, Vasopneumatic device, Ionotophoresis 4mg /ml Dexamethasone, and Manual therapy   PLAN FOR NEXT SESSION: Review HEP and progress PRN, standing/gait training in parallel bars progressing to using rolling walker as able, progress knee flexion ROM, continue quad strengthening and control     , PT, DPT, LAT, ATC 11/07/21  3:26 PM Phone: [phone] Fax: [phone]

## 2021-11-09 ENCOUNTER — Encounter: Payer: Self-pay | Admitting: Physical Therapy

## 2021-11-09 ENCOUNTER — Other Ambulatory Visit: Payer: Self-pay

## 2021-11-09 ENCOUNTER — Ambulatory Visit: Payer: Medicaid Other | Admitting: Physical Therapy

## 2021-11-09 DIAGNOSIS — M25561 Pain in right knee: Secondary | ICD-10-CM | POA: Diagnosis not present

## 2021-11-09 DIAGNOSIS — G8929 Other chronic pain: Secondary | ICD-10-CM

## 2021-11-09 DIAGNOSIS — M6281 Muscle weakness (generalized): Secondary | ICD-10-CM

## 2021-11-09 NOTE — Therapy (Signed)
OUTPATIENT PHYSICAL THERAPY TREATMENT NOTE   Patient Name: Devin Jones MRN: [id number] DOB:[birth year], 56 y.o., male Today's Date: 11/09/2021  PCP: Claiborne Rigg, NP REFERRING PROVIDER: Cammy Copa, MD   PT End of Session - 11/09/21 1029     Visit Number 4    Number of Visits 17    Date for PT Re-Evaluation 12/26/21    Authorization Type Self Pay (MCD Pending)    PT Start Time 1030    PT Stop Time 1115    PT Time Calculation (min) 45 min    Equipment Utilized During Treatment Left knee immobilizer    Activity Tolerance Patient tolerated treatment well    Behavior During Therapy [facility] for tasks assessed/performed               Past Medical History:  Diagnosis Date   Alcohol abuse    Past Surgical History:  Procedure Laterality Date   ANTERIOR CRUCIATE LIGAMENT REPAIR Right 09/28/2021   Procedure: ANTERIOR CRUCIATE LIGAMENT (ACL) & POSTERIOR CRUCIATE LIGAMENT (PCL) REPAIR;  Surgeon: Cammy Copa, MD;  Location: [facility] OR;  Service: Orthopedics;  Laterality: Right;   COSMETIC SURGERY     HARDWARE REMOVAL Right 09/28/2021   Procedure: HARDWARE REMOVAL;  Surgeon: Cammy Copa, MD;  Location: [facility] OR;  Service: Orthopedics;  Laterality: Right;   MEDIAL COLLATERAL LIGAMENT REPAIR, KNEE Right 08/31/2021   Procedure: RIGHT KNEE MEDIAL COLLATERAL LIGAMENT REPAIR/RECONSTRUCTION (ALL OPEN);  Surgeon: Cammy Copa, MD;  Location: [facility] OR;  Service: Orthopedics;  Laterality: Right;   ORIF RADIAL FRACTURE Right 08/28/2021   Procedure: OPEN REDUCTION INTERNAL FIXATION (ORIF) RADIAL FRACTURE;  Surgeon: Myrene Galas, MD;  Location: [facility] OR;  Service: Orthopedics;  Laterality: Right;   SACRO-ILIAC PINNING Right 08/28/2021   Procedure: SACRO-ILIAC SCREW FIXATION WITH  ANTERIOR PELVIC REPAIR;  Surgeon: Myrene Galas, MD;  Location: [facility] OR;  Service: Orthopedics;  Laterality: Right;   Patient Active Problem List   Diagnosis Date Noted   Rupture of anterior  cruciate ligament of right knee    Rupture of posterior cruciate ligament of right knee    Complex tear of lateral meniscus of left knee as current injury    Injury of posterolateral corner of knee, left, subsequent encounter    Tear of LCL (lateral collateral ligament) of knee, left, subsequent encounter    Retained orthopedic hardware    Hyperglycemia 10/02/2021   Hypoalbuminemia 10/02/2021   Elevated alkaline phosphatase level 10/02/2021   S/P ACL reconstruction 09/28/2021   Right knee dislocation, subsequent encounter    TBI (traumatic brain injury) 09/07/2021   Multiple trauma 09/07/2021   Pelvic fracture (HCC) 08/26/2021    REFERRING PROVIDER: Cammy Copa, MD  REFERRING DIAG: Right knee dislocation, Status post left knee surgery  THERAPY DIAG:  Chronic pain of right knee  Chronic pain of left knee  Muscle weakness (generalized)  PERTINENT HISTORY: Right knee open MCL reconstruction on 08/28/2021, ACL/PCL reconstruction on 09/28/2020 Left knee LCL and lateral corner reconstruction on 09/28/2020 Unstable pelvic ring fracture s/p ORIF and SI screws (L to R) on 08/28/2021 Right galeazzi fracture dislocation (radius) s/p ORIF 08/28/2021  PRECAUTIONS: Per surgeon ROM as tolerated, no open chain exercises; WBAT  SUBJECTIVE: Pt reports he is doing well, he has been walking with his walker around the home. He states he feels secure while walking.  PAIN:  Are you having pain? No NPRS scale: 0/10 (increase in pain with activity) Pain location: Knee Pain orientation: Bilateral PAIN  TYPE: Surgical  Pain description: Intermittent, twinge Aggravating factors: Keeping knees static (bent or straight) Relieving factors: Medication  PATIENT GOALS: Get back to walking normally, start cycling again   OBJECTIVE:  MUSCLE LENGTH: Slight limitation of bilateral calf and hamstring, quad flexibility not assessed on evaluation   LE AROM:   AROM Right 10/31/2021 Left 10/31/2021  Right 11/02/2021 Left 11/02/2021  Knee flexion 105 120 105 120  Knee extension 0 0      LE MMT:                      Formal strength assessment deferred this visit Patient demonstrates ability to activate quad well, but with slight extension lag with SLR and fatigues quickly.   FUNCTIONAL TESTS:  Patient able to perform sit to stand from mat table using rolling walker for assist, CGA for steadying in standing position  : 365 ft with no seated rest (1.0 ft/sec gait speed) - 11/02/2021   GAIT: Distance walked: N/A Assistive device utilized: Environmental consultant - 2 wheeled Level of assistance: Modified I Comments: patient demonstrates appropriate heel toe progression and remains in good position with rolling walker    TODAY'S TREATMENT: 11/09/2021:  Therapeutic Exercise: NuStep L6 x 5 min with LE while taking subjective Partial squat in // bars with BUE support 2 x 10  Heel raise in // bars 2 x 10 Forward step-up on 4" box in // bars2 x 10 each SLR 2 x 10 each Bridge 2 x 10 Sidelying hip abduction with red proximal to knees 2 x 10 each Prone hip extension with 2 pillows under hips 2 x 10 each Sit to stand without UE assist from elevated table 2 x 10 Neur Re-ed: Tandem stance in // bars 3 x 30 sec each SLS with bilat finger support 3 x 15 sec each   11/07/2021:  Therapeutic Exercise: NuStep L6 x 5 min with LE while taking subjective Partial squat in // bars with BUE support 2 x 10  Heel raise in // bars 2 x 10 SLR 2 x 10 Bridge 2 x 10 Sidelying hip abduction x 10, with yellow proximal to knees x 10 Sit to stand with hands on thighs from elevated table 2 x 5 Gait Training: Using RW 2 x 118ft, cues to remain within walker with reduce UE support, smooth heel-toe progression, upright posture with forward gaze Neur Re-ed: Tandem stance in // bars 3 x 30 sec each Side stepping in // bars with occasional UE support  11/02/21 Therapeutic Exercise: Standing hip extension in FWW 2x10  BIL Standing hip abduction in FWW 2x10 BIL Standing high marches in FWW 2x10 BIL Supine SLR with hip abduction 2x10 on Lt Supine Lt knee flexion AAROM with strap 2x10 with 5-sec hold at end range Therapeutic Activity: Sit-to-stand with FWW 2x5 with FWW Modalities: GameReady Vasopneumatic treatment of Rt knee x10 minutes at 34 degrees F   PATIENT EDUCATION:  Education details: HEP Person educated: Patient Education method: Programmer, multimedia, Demonstration, Actor cues, Verbal cues Education comprehension: verbalized understanding, returned demonstration, verbal cues required, tactile cues required, and needs further education   HOME EXERCISE PROGRAM: Access Code: [email or web address]     ASSESSMENT: CLINICAL IMPRESSION: Patient tolerated therapy well with no adverse effects. He arrives in wheel chair but provided RW in therapy to walk in gym, no cues needed for walking this visit. Therapy continues to focus on progressing strength, balance, and walking ability. He has greatly improved with walking and encouraged to  continue walking around his home using RW. Patient was able to progress to forward step ups this visit and continues to progress with his strengthening exercises. He was also able to perform SLS with fingertip support and good knee control. No pain reported with therapy. Added squats at counter for HEP to progress strength. Patient would benefit from continued skilled PT to progress his mobility and strength to return to walking and prior activity level without limitation.  Objective impairments include Abnormal gait, decreased balance, difficulty walking, decreased ROM, decreased strength, increased edema, impaired flexibility, improper body mechanics, and pain.      GOALS: Goals reviewed with patient? Yes   SHORT TERM GOALS:   STG Name Target Date Goal status  1 Patient will be I with initial HEP in order to progress with therapy. Baseline: HEP provided at eval 11/02/2021: Pt  reports daily adherence to his HEP 11/28/2021 ACHIEVED  2 Patient will demonstrate SLR without extension lag to indicate improve quad control/strength Baseline: patient exhibits slight extension lag and endurance deficit with SLR 11/28/2021 INITIAL  3 Patient will achieve >/= 120 deg knee flexion AROM bilaterally to improve transfer ability Baseline: 105 deg right knee flexion 11/28/2021 INITIAL  4 Patient will be able to ambulate household distances using LRAD in order to improve independence and ability to perform self care tasks Baseline: patient is using wheel chair for all mobility 11/02/2021: Pt able to ambulate 365 feet in 6 minutes  11/28/2021 ACHIEVED    LONG TERM GOALS:    LTG Name Target Date Goal status  1 Patient will be I with final HEP to maintain progress from PT. Baseline: HEP provided at eval 12/26/2021 INITIAL  2 Patient will be able to ambulate community level distances with LRAD in order to improve community access and ability to return to work Baseline: patient using wheel chair for all mobility at evaluation 12/26/2021 INITIAL  3 Patient will exhibit bilateral knee AROM grossly WFL and non-painful to allow for ability to return to cycling Baseline: patient demonstrates limitations with knee AROM 12/26/2021 INITIAL  4 Patient will demonstrate bilateral knee strength 5/5 MMT in order to improve ability to squat/stoop to perform work related tasks Baseline: not formally assessed at evaluation, he demonstrates gross strength deficit with decreased quad control 12/26/2021 INITIAL  5 Patient will report </= 2/10 pain with all activity in order to reduce functional limitation and allow for return to prior level of function Baseline: patient reports increased pain with activity 12/26/2021 INITIAL      PLAN: PT FREQUENCY: 2x/week   PT DURATION: 8 weeks   PLANNED INTERVENTIONS: Therapeutic exercises, Therapeutic activity, Neuro Muscular re-education, Balance training, Gait training,  Patient/Family education, Joint mobilization, Aquatic Therapy, Dry Needling, Electrical stimulation, Cryotherapy, Moist heat, scar mobilization, Taping, Vasopneumatic device, Ionotophoresis 4mg /ml Dexamethasone, and Manual therapy   PLAN FOR NEXT SESSION: Review HEP and progress PRN, gait training using rolling walker progressing to cane as able, progress strength and balance, knee flexion ROM     , PT, DPT, LAT, ATC 11/09/21  11:16 AM Phone: [phone] Fax: [phone]

## 2021-11-09 NOTE — Patient Instructions (Signed)
Access Code: [email or web address] URL: [email or web address] Date: 11/09/2021 Prepared by: Rosana Hoes  Exercises Long Sitting Quad Set - 5 x daily - 7 x weekly - 10 reps - 5 seconds hold Supine Straight Leg Raise with Elbow Support - 2-3 x daily - 7 x weekly - 2 sets - 10 reps Long Sitting Calf Stretch with Strap - 2-3 x daily - 7 x weekly - 3 reps - 30 seconds hold Hooklying Hamstring Stretch with Strap - 2-3 x daily - 7 x weekly - 3 reps - 30 seconds hold Sitting Heel Slide with Towel - 2-3 x daily - 7 x weekly - 10 reps - 5-10 seconds hold Long Sitting 4 Way Patellar Glide - 2 x daily - 7 x weekly - 20 reps Sidelying Hip Abduction - 2-3 x daily - 7 x weekly - 2 sets - 10 reps Mini Squat with Counter Support - 2-3 x daily - 7 x weekly - 2 sets - 10 reps

## 2021-11-10 ENCOUNTER — Ambulatory Visit (INDEPENDENT_AMBULATORY_CARE_PROVIDER_SITE_OTHER): Payer: Self-pay | Admitting: Surgical

## 2021-11-10 ENCOUNTER — Encounter: Payer: Self-pay | Admitting: Orthopedic Surgery

## 2021-11-10 DIAGNOSIS — S83104D Unspecified dislocation of right knee, subsequent encounter: Secondary | ICD-10-CM

## 2021-11-10 DIAGNOSIS — Z9889 Other specified postprocedural states: Secondary | ICD-10-CM

## 2021-11-10 NOTE — Progress Notes (Signed)
Post-Op Visit Note   Patient: Devin Jones           Date of Birth: [birth year]           MRN: [id number] Visit Date: 11/10/2021 PCP: Claiborne Rigg, NP   Assessment & Plan:  Chief Complaint:  Chief Complaint  Patient presents with   Right Knee - Routine Post Op    right knee open MCL reconstruction on 08/28/21 for knee dislocation.  09/28/21 he underwent right knee ACL PCL reconstruction as well as partial lateral corner and LCL reconstruction on the left knee.    Visit Diagnoses:  1. Right knee dislocation, subsequent encounter   2. Status post left knee surgery     Plan: Patient is a 56 year old male who presents s/p right knee open MCL repair in December 2022 with subsequent right knee ACL and PCL reconstruction and left knee LCL/PLC reconstruction on 09/28/2021.  He returns today with no complaint of pain.  He has not taken any pain medication recently.  He has completely discontinued any opioid medication and gabapentin.  Denies any complaint of chest pain, shortness of breath, calf pain.  He is ambulating with a walker and with a wheelchair but is trying to use the walker more and more in physical therapy.  He feels like physical therapy is going well and he has been going for the last 2 weeks.  This next week they are focusing more on walking exclusively with a walker and not using the wheelchair.  He is still ambulating with Bledsoe brace on the left knee and no brace on the right.  On exam, patient has range of motion 0-100 on the right and 0-1 20 on the left.  ACL and PCL grafts are stable on anterior and posterior drawer and ACL graft is stable on Lachman exam on the right knee.  Left knee is stable to varus stress at 0 and 30 degrees.  There is no posterolateral rotational instability of the left knee.  Incisions are well-healed with no evidence of infection or dehiscence.  No calf tenderness.  Negative Homans' sign bilaterally.  No significant effusion noted in either  knee.  He is able to perform straight leg raise with both knees.  Right knee is stable to valgus stress at 0 and 30 degrees as well.  Plan is to continue with physical therapy.  He is okay for range of motion and strengthening exercises but do recommend against most open chain exercises for the right knee to protect the ACL graft.  He is okay for straight leg raise exercises as long as there is no resistance such as ankle weights or using a knee extension weightlifting machine.  Okay to discontinue the Bledsoe brace on the left knee.  Okay for weightbearing as tolerated on both knees.  He will continue working with physical therapy with focus on strengthening and gait training and follow-up with the office in 4 weeks for clinical recheck.  Follow-Up Instructions: No follow-ups on file.   Orders:  No orders of the defined types were placed in this encounter.  No orders of the defined types were placed in this encounter.   Imaging: No results found.  PMFS History: Patient Active Problem List   Diagnosis Date Noted   Rupture of anterior cruciate ligament of right knee    Rupture of posterior cruciate ligament of right knee    Complex tear of lateral meniscus of left knee as current injury    Injury  of posterolateral corner of knee, left, subsequent encounter    Tear of LCL (lateral collateral ligament) of knee, left, subsequent encounter    Retained orthopedic hardware    Hyperglycemia 10/02/2021   Hypoalbuminemia 10/02/2021   Elevated alkaline phosphatase level 10/02/2021   S/P ACL reconstruction 09/28/2021   Right knee dislocation, subsequent encounter    TBI (traumatic brain injury) 09/07/2021   Multiple trauma 09/07/2021   Pelvic fracture (HCC) 08/26/2021   Past Medical History:  Diagnosis Date   Alcohol abuse     Family History  Problem Relation Age of Onset   Kidney disease Mother    Cancer Father    Hypertension Father    Heart disease Father    Liver cancer Father     Cancer - Other Father    Stroke Brother     Past Surgical History:  Procedure Laterality Date   ANTERIOR CRUCIATE LIGAMENT REPAIR Right 09/28/2021   Procedure: ANTERIOR CRUCIATE LIGAMENT (ACL) & POSTERIOR CRUCIATE LIGAMENT (PCL) REPAIR;  Surgeon: Cammy Copa, MD;  Location: [facility] OR;  Service: Orthopedics;  Laterality: Right;   COSMETIC SURGERY     HARDWARE REMOVAL Right 09/28/2021   Procedure: HARDWARE REMOVAL;  Surgeon: Cammy Copa, MD;  Location: [facility] OR;  Service: Orthopedics;  Laterality: Right;   MEDIAL COLLATERAL LIGAMENT REPAIR, KNEE Right 08/31/2021   Procedure: RIGHT KNEE MEDIAL COLLATERAL LIGAMENT REPAIR/RECONSTRUCTION (ALL OPEN);  Surgeon: Cammy Copa, MD;  Location: [facility] OR;  Service: Orthopedics;  Laterality: Right;   ORIF RADIAL FRACTURE Right 08/28/2021   Procedure: OPEN REDUCTION INTERNAL FIXATION (ORIF) RADIAL FRACTURE;  Surgeon: Myrene Galas, MD;  Location: [facility] OR;  Service: Orthopedics;  Laterality: Right;   SACRO-ILIAC PINNING Right 08/28/2021   Procedure: SACRO-ILIAC SCREW FIXATION WITH  ANTERIOR PELVIC REPAIR;  Surgeon: Myrene Galas, MD;  Location: [facility] OR;  Service: Orthopedics;  Laterality: Right;   Social History   Occupational History   Occupation: Event organiser: [employer]  Tobacco Use   Smoking status: Never   Smokeless tobacco: Never  Substance and Sexual Activity   Alcohol use: Yes    Alcohol/week: 19.0 standard drinks    Types: 16 Cans of beer, 3 Shots of liquor per week   Drug use: Never   Sexual activity: Not on file

## 2021-11-13 ENCOUNTER — Encounter: Payer: Self-pay | Admitting: Physical Therapy

## 2021-11-13 ENCOUNTER — Ambulatory Visit: Payer: Medicaid Other | Admitting: Physical Therapy

## 2021-11-13 ENCOUNTER — Other Ambulatory Visit: Payer: Self-pay

## 2021-11-13 DIAGNOSIS — G8929 Other chronic pain: Secondary | ICD-10-CM

## 2021-11-13 DIAGNOSIS — M25561 Pain in right knee: Secondary | ICD-10-CM

## 2021-11-13 DIAGNOSIS — R2689 Other abnormalities of gait and mobility: Secondary | ICD-10-CM

## 2021-11-13 DIAGNOSIS — M6281 Muscle weakness (generalized): Secondary | ICD-10-CM

## 2021-11-13 NOTE — Therapy (Signed)
OUTPATIENT PHYSICAL THERAPY TREATMENT NOTE   Patient Name: Devin Jones MRN: [id number] DOB:December 23, 1965, 56 y.o., male Today's Date: 11/13/2021  PCP: Claiborne Rigg, NP REFERRING PROVIDER: Claiborne Rigg, NP   PT End of Session - 11/13/21 1235     Visit Number 5    Number of Visits 17    Date for PT Re-Evaluation 12/26/21    Authorization Type Self Pay (MCD Pending)    PT Start Time 1232    PT Stop Time 1315    PT Time Calculation (min) 43 min               Past Medical History:  Diagnosis Date   Alcohol abuse    Past Surgical History:  Procedure Laterality Date   ANTERIOR CRUCIATE LIGAMENT REPAIR Right 09/28/2021   Procedure: ANTERIOR CRUCIATE LIGAMENT (ACL) & POSTERIOR CRUCIATE LIGAMENT (PCL) REPAIR;  Surgeon: Cammy Copa, MD;  Location: [facility] OR;  Service: Orthopedics;  Laterality: Right;   COSMETIC SURGERY     HARDWARE REMOVAL Right 09/28/2021   Procedure: HARDWARE REMOVAL;  Surgeon: Cammy Copa, MD;  Location: [facility] OR;  Service: Orthopedics;  Laterality: Right;   MEDIAL COLLATERAL LIGAMENT REPAIR, KNEE Right 08/31/2021   Procedure: RIGHT KNEE MEDIAL COLLATERAL LIGAMENT REPAIR/RECONSTRUCTION (ALL OPEN);  Surgeon: Cammy Copa, MD;  Location: [facility] OR;  Service: Orthopedics;  Laterality: Right;   ORIF RADIAL FRACTURE Right 08/28/2021   Procedure: OPEN REDUCTION INTERNAL FIXATION (ORIF) RADIAL FRACTURE;  Surgeon: Myrene Galas, MD;  Location: [facility] OR;  Service: Orthopedics;  Laterality: Right;   SACRO-ILIAC PINNING Right 08/28/2021   Procedure: SACRO-ILIAC SCREW FIXATION WITH  ANTERIOR PELVIC REPAIR;  Surgeon: Myrene Galas, MD;  Location: [facility] OR;  Service: Orthopedics;  Laterality: Right;   Patient Active Problem List   Diagnosis Date Noted   Rupture of anterior cruciate ligament of right knee    Rupture of posterior cruciate ligament of right knee    Complex tear of lateral meniscus of left knee as current injury    Injury of posterolateral  corner of knee, left, subsequent encounter    Tear of LCL (lateral collateral ligament) of knee, left, subsequent encounter    Retained orthopedic hardware    Hyperglycemia 10/02/2021   Hypoalbuminemia 10/02/2021   Elevated alkaline phosphatase level 10/02/2021   S/P ACL reconstruction 09/28/2021   Right knee dislocation, subsequent encounter    TBI (traumatic brain injury) 09/07/2021   Multiple trauma 09/07/2021   Pelvic fracture (HCC) 08/26/2021    REFERRING PROVIDER: Cammy Copa, MD  REFERRING DIAG: Right knee dislocation, Status post left knee surgery  THERAPY DIAG:  Chronic pain of right knee  Chronic pain of left knee  Muscle weakness (generalized)  Other abnormalities of gait and mobility  PERTINENT HISTORY: Right knee open MCL reconstruction on 08/28/2021, ACL/PCL reconstruction on 09/28/2020 Left knee LCL and lateral corner reconstruction on 09/28/2020 Unstable pelvic ring fracture s/p ORIF and SI screws (L to R) on 08/28/2021 Right galeazzi fracture dislocation (radius) s/p ORIF 08/28/2021  PRECAUTIONS: Per surgeon ROM as tolerated, no open chain exercises; WBAT  SUBJECTIVE: I have pain with working on right knee flexion. Otherwise no knee pain. I do feel like I have rolled my left ankle. Not sure how.   PAIN:  Are you having pain? No NPRS scale: 0/10 (increase in pain with activity) Pain location: Knee Pain orientation: Bilateral PAIN TYPE: Surgical  Pain description: Intermittent, twinge Aggravating factors: Keeping knees static (bent or straight) Relieving factors: Medication  PATIENT GOALS: Get back to walking normally, start cycling again   OBJECTIVE:  MUSCLE LENGTH: Slight limitation of bilateral calf and hamstring, quad flexibility not assessed on evaluation   LE AROM:   AROM Right 10/31/2021 Left 10/31/2021 Right 11/02/2021 Left 11/02/2021  Knee flexion 105 120 105 120  Knee extension 0 0      LE MMT:                      Formal  strength assessment deferred this visit Patient demonstrates ability to activate quad well, but with slight extension lag with SLR and fatigues quickly.   FUNCTIONAL TESTS:  Patient able to perform sit to stand from mat table using rolling walker for assist, CGA for steadying in standing position  : 365 ft with no seated rest (1.0 ft/sec gait speed) - 11/02/2021   GAIT: Distance walked: N/A Assistive device utilized: Environmental consultant - 2 wheeled Level of assistance: Modified I Comments: patient demonstrates appropriate heel toe progression and remains in good position with rolling walker    TODAY'S TREATMENT: 11/13/2021:  Therapeutic Exercise: NuStep L6 x 5 min with LE while taking subjective Gait in parallel bars and in clinic with SPC 200 feet - RUE - good safety Heel raise in // bars 2 x 10 Forward step-up on 4" box in // bars2 x 10 each SLR 2 x 10 each Bridge 2 x 10  Sit to stand without UE assist from elevated table 2 x 10 Neur Re-ed: Tandem stance in // bars 2 x 45 sec each SLS with bilat finger support 1 x 15 sec each  11/09/2021:  Therapeutic Exercise: NuStep L6 x 5 min with LE while taking subjective Partial squat in // bars with BUE support 2 x 10  Heel raise in // bars 2 x 10 Forward step-up on 4" box in // bars2 x 10 each- 1 UE touch SLR 2 x 10 each Bridge 2 x 10 Sidelying hip abduction with red proximal to knees 2 x 10 each Prone hip extension with 2 pillows under hips 2 x 10 each Sit to stand without UE assist from elevated table 2 x 10 Neur Re-ed: Tandem stance in // bars 3 x 45 sec each SLS with bilat finger support 3 x 15 sec each   11/07/2021:  Therapeutic Exercise: NuStep L6 x 5 min with LE while taking subjective Partial squat in // bars with BUE support 2 x 10  Heel raise in // bars 2 x 10 SLR 2 x 10 Bridge 2 x 10 Sidelying hip abduction x 10, with yellow proximal to knees x 10 Sit to stand with hands on thighs from elevated table 2 x 5 Gait  Training: Using RW 2 x 144ft, cues to remain within walker with reduce UE support, smooth heel-toe progression, upright posture with forward gaze Neur Re-ed: Tandem stance in // bars 3 x 30 sec each Side stepping in // bars with occasional UE support  11/02/21 Therapeutic Exercise: Standing hip extension in FWW 2x10 BIL Standing hip abduction in FWW 2x10 BIL Standing high marches in FWW 2x10 BIL Supine SLR with hip abduction 2x10 on Lt Supine Lt knee flexion AAROM with strap 2x10 with 5-sec hold at end range Therapeutic Activity: Sit-to-stand with FWW 2x5 with FWW Modalities: GameReady Vasopneumatic treatment of Rt knee x10 minutes at 34 degrees F   PATIENT EDUCATION:  Education details: HEP Person educated: Patient Education method: Explanation, Demonstration, Actor cues, Verbal cues Education comprehension:  verbalized understanding, returned demonstration, verbal cues required, tactile cues required, and needs further education   HOME EXERCISE PROGRAM: Access Code: [email or web address]     ASSESSMENT: CLINICAL IMPRESSION: Patient tolerated therapy well with no adverse effects. He arrives with RW today and he carries the back end off the floor. He states is has been walking like this at home. Able to progress to [facility] in clininc with good tolerance and safety. He as a SPC at home he will try using indoor. He did have some RUE fatigue using SPC in right hand.  Therapy continues to focus on progressing strength, balance, and walking ability. He has greatly improved with walking and encouraged to continue walking around his home using [facility] and [place] RW out of home.  No pain reported with therapy.  Objective impairments include Abnormal gait, decreased balance, difficulty walking, decreased ROM, decreased strength, increased edema, impaired flexibility, improper body mechanics, and pain.      GOALS: Goals reviewed with patient? Yes   SHORT TERM GOALS:   STG Name Target Date Goal status  1  Patient will be I with initial HEP in order to progress with therapy. Baseline: HEP provided at eval 11/02/2021: Pt reports daily adherence to his HEP 11/28/2021 ACHIEVED  2 Patient will demonstrate SLR without extension lag to indicate improve quad control/strength Baseline: patient exhibits slight extension lag and endurance deficit with SLR 11/28/2021 INITIAL  3 Patient will achieve >/= 120 deg knee flexion AROM bilaterally to improve transfer ability Baseline: 105 deg right knee flexion 11/28/2021 INITIAL  4 Patient will be able to ambulate household distances using LRAD in order to improve independence and ability to perform self care tasks Baseline: patient is using wheel chair for all mobility 11/02/2021: Pt able to ambulate 365 feet in 6 minutes  11/28/2021 ACHIEVED    LONG TERM GOALS:    LTG Name Target Date Goal status  1 Patient will be I with final HEP to maintain progress from PT. Baseline: HEP provided at eval 12/26/2021 INITIAL  2 Patient will be able to ambulate community level distances with LRAD in order to improve community access and ability to return to work Baseline: patient using wheel chair for all mobility at evaluation 12/26/2021 INITIAL  3 Patient will exhibit bilateral knee AROM grossly WFL and non-painful to allow for ability to return to cycling Baseline: patient demonstrates limitations with knee AROM 12/26/2021 INITIAL  4 Patient will demonstrate bilateral knee strength 5/5 MMT in order to improve ability to squat/stoop to perform work related tasks Baseline: not formally assessed at evaluation, he demonstrates gross strength deficit with decreased quad control 12/26/2021 INITIAL  5 Patient will report </= 2/10 pain with all activity in order to reduce functional limitation and allow for return to prior level of function Baseline: patient reports increased pain with activity 12/26/2021 INITIAL      PLAN: PT FREQUENCY: 2x/week   PT DURATION: 8 weeks   PLANNED  INTERVENTIONS: Therapeutic exercises, Therapeutic activity, Neuro Muscular re-education, Balance training, Gait training, Patient/Family education, Joint mobilization, Aquatic Therapy, Dry Needling, Electrical stimulation, Cryotherapy, Moist heat, scar mobilization, Taping, Vasopneumatic device, Ionotophoresis 4mg /ml Dexamethasone, and Manual therapy   PLAN FOR NEXT SESSION: Review HEP and progress PRN, gait training using rolling walker progressing to cane as able, progress strength and balance, knee flexion ROM    Jannette Spanner, PTA 11/13/21 1:14 PM Phone: [phone] Fax: [phone]

## 2021-11-13 NOTE — Therapy (Signed)
OUTPATIENT PHYSICAL THERAPY TREATMENT NOTE   Patient Name: Devin Jones MRN: [id number] DOB:Jul 24, 1966, 56 y.o., male Today's Date: 11/16/2021  PCP: Claiborne Rigg, NP REFERRING PROVIDER: Cammy Copa, MD   PT End of Session - 11/16/21 [phone]     Visit Number 6    Number of Visits 17    Date for PT Re-Evaluation 12/26/21    Authorization Type Self Pay (MCD Pending)    PT Start Time 1000    PT Stop Time 1044    PT Time Calculation (min) 44 min    Activity Tolerance Patient tolerated treatment well    Behavior During Therapy [facility] for tasks assessed/performed                Past Medical History:  Diagnosis Date   Alcohol abuse    Past Surgical History:  Procedure Laterality Date   ANTERIOR CRUCIATE LIGAMENT REPAIR Right 09/28/2021   Procedure: ANTERIOR CRUCIATE LIGAMENT (ACL) & POSTERIOR CRUCIATE LIGAMENT (PCL) REPAIR;  Surgeon: Cammy Copa, MD;  Location: [facility] OR;  Service: Orthopedics;  Laterality: Right;   COSMETIC SURGERY     HARDWARE REMOVAL Right 09/28/2021   Procedure: HARDWARE REMOVAL;  Surgeon: Cammy Copa, MD;  Location: [facility] OR;  Service: Orthopedics;  Laterality: Right;   MEDIAL COLLATERAL LIGAMENT REPAIR, KNEE Right 08/31/2021   Procedure: RIGHT KNEE MEDIAL COLLATERAL LIGAMENT REPAIR/RECONSTRUCTION (ALL OPEN);  Surgeon: Cammy Copa, MD;  Location: [facility] OR;  Service: Orthopedics;  Laterality: Right;   ORIF RADIAL FRACTURE Right 08/28/2021   Procedure: OPEN REDUCTION INTERNAL FIXATION (ORIF) RADIAL FRACTURE;  Surgeon: Myrene Galas, MD;  Location: [facility] OR;  Service: Orthopedics;  Laterality: Right;   SACRO-ILIAC PINNING Right 08/28/2021   Procedure: SACRO-ILIAC SCREW FIXATION WITH  ANTERIOR PELVIC REPAIR;  Surgeon: Myrene Galas, MD;  Location: [facility] OR;  Service: Orthopedics;  Laterality: Right;   Patient Active Problem List   Diagnosis Date Noted   Rupture of anterior cruciate ligament of right knee    Rupture of posterior  cruciate ligament of right knee    Complex tear of lateral meniscus of left knee as current injury    Injury of posterolateral corner of knee, left, subsequent encounter    Tear of LCL (lateral collateral ligament) of knee, left, subsequent encounter    Retained orthopedic hardware    Hyperglycemia 10/02/2021   Hypoalbuminemia 10/02/2021   Elevated alkaline phosphatase level 10/02/2021   S/P ACL reconstruction 09/28/2021   Right knee dislocation, subsequent encounter    TBI (traumatic brain injury) 09/07/2021   Multiple trauma 09/07/2021   Pelvic fracture (HCC) 08/26/2021    REFERRING PROVIDER: Cammy Copa, MD  REFERRING DIAG: Right knee dislocation, Status post left knee surgery  THERAPY DIAG:  Chronic pain of right knee  Chronic pain of left knee  Muscle weakness (generalized)  Other abnormalities of gait and mobility  PERTINENT HISTORY: Right knee open MCL reconstruction on 08/28/2021, ACL/PCL reconstruction on 09/28/2020 Left knee LCL and lateral corner reconstruction on 09/28/2020 Unstable pelvic ring fracture s/p ORIF and SI screws (L to R) on 08/28/2021 Right galeazzi fracture dislocation (radius) s/p ORIF 08/28/2021  PRECAUTIONS: Per surgeon ROM as tolerated, no open chain exercises; WBAT  SUBJECTIVE: Patient reports he has been using the cane for walking around the house and he feels better using the cane than the walker. He has been a little slack on working on knee bending since last visit, but all the other exercise are going well. His knees are  feeling good.   PAIN:  Are you having pain? No NPRS scale: 0/10 (increase in pain with activity) Pain location: Knee Pain orientation: Bilateral PAIN TYPE: Surgical  Pain description: Intermittent, twinge Aggravating factors: Keeping knees static (bent or straight) Relieving factors: Medication  PATIENT GOALS: Get back to walking normally, start cycling again   OBJECTIVE:  MUSCLE LENGTH: Slight limitation  of bilateral calf and hamstring, quad flexibility not assessed on evaluation   LE AROM:   AROM Right 10/31/2021 Left 10/31/2021 Right 11/16/2021 Left 11/16/2021  Knee flexion 105 120 118 120  Knee extension 0 0 0 0    LE MMT: Patient demonstrates SLR without extension lag bilaterally   FUNCTIONAL TESTS:  Patient able to perform sit to stand from standard chair with hands on thighs  : 815 using SPC on left side (365 using RW 11/02/2021)   GAIT: Distance walked: 815 Assistive device utilized: SPC Level of assistance: Modified I Comments: patient demonstrates appropriate heel toe progression, hesitant with turns and continues with decreased gait speed    TODAY'S TREATMENT: 11/16/2021:  Therapeutic Exercise: NuStep L6 x 5 min with LE while taking subjective using SPC on left - workload capacity training Heel raise 2 x 20 Forward step-up on 6" box 2 x 10 each - contralateral UE support SLR 2 x 10 each Bridge 2 x 10 Sidelying hip abduction 2 x 10 each Wall squat 2 x 10 Neur Re-ed: Tandem stance on Airex 3 x 30 sec each SLS 3 x 15 sec each - occasional touch down for balance correction   11/13/2021:  Therapeutic Exercise: NuStep L6 x 5 min with LE while taking subjective Gait in parallel bars and in clinic with SPC 200 feet - RUE - good safety Heel raise in // bars 2 x 10 Forward step-up on 4" box in // bars2 x 10 each SLR 2 x 10 each Bridge 2 x 10  Sit to stand without UE assist from elevated table 2 x 10 Neur Re-ed: Tandem stance in // bars 2 x 45 sec each SLS with bilat finger support 1 x 15 sec each  11/09/2021:  Therapeutic Exercise: NuStep L6 x 5 min with LE while taking subjective Partial squat in // bars with BUE support 2 x 10  Heel raise in // bars 2 x 10 Forward step-up on 4" box in // bars2 x 10 each- 1 UE touch SLR 2 x 10 each Bridge 2 x 10 Sidelying hip abduction with red proximal to knees 2 x 10 each Prone hip extension with 2 pillows under hips  2 x 10 each Sit to stand without UE assist from elevated table 2 x 10 Neur Re-ed: Tandem stance in // bars 3 x 45 sec each SLS with bilat finger support 3 x 15 sec each   PATIENT EDUCATION:  Education details: HEP Person educated: Patient Education method: Programmer, multimedia, Demonstration, Actor cues, Verbal cues Education comprehension: verbalized understanding, returned demonstration, verbal cues required, tactile cues required, and needs further education   HOME EXERCISE PROGRAM: Access Code: [email or web address]     ASSESSMENT: CLINICAL IMPRESSION: Patient tolerated therapy well with no adverse effects. Therapy continues to focus on progressing strength and balance in order to improve walking and mobility. Patient seems to be improve with therapy and has transitioned to using [facility] for majority of walking. He demonstrates continued improvement in strength and control, progressing with closed chain exercises and tolerating greater challenges to balance with use of unstable foam surface. He does continue  to report fatigue with exercises. Overall he demonstrates great improvement with his walking on and was instructed to begin transition to using [facility] for all community ambulation as well. Patient would benefit from continued skilled PT to progress his mobility and strength to return to walking and prior activity level without limitation.  Objective impairments include Abnormal gait, decreased balance, difficulty walking, decreased ROM, decreased strength, increased edema, impaired flexibility, improper body mechanics, and pain.      GOALS: Goals reviewed with patient? Yes   SHORT TERM GOALS:   STG Name Target Date Goal status  1 Patient will be I with initial HEP in order to progress with therapy. Baseline: HEP provided at eval 11/02/2021: Pt reports daily adherence to his HEP 11/28/2021 ACHIEVED  2 Patient will demonstrate SLR without extension lag to indicate improve quad  control/strength Baseline: patient exhibits slight extension lag and endurance deficit with SLR 11/16/2021: able to perform SLR without extension lag 11/28/2021 ACHIEVED  3 Patient will achieve >/= 120 deg knee flexion AROM bilaterally to improve transfer ability Baseline: 105 deg right knee flexion 11/16/2021: left 120 deg, right 118 deg 11/28/2021 ONGOING  4 Patient will be able to ambulate household distances using LRAD in order to improve independence and ability to perform self care tasks Baseline: patient is using wheel chair for all mobility 11/02/2021: Pt able to ambulate 365 feet in 6 minutes 11/28/2021 ACHIEVED    LONG TERM GOALS:    LTG Name Target Date Goal status  1 Patient will be I with final HEP to maintain progress from PT. Baseline: HEP provided at eval 12/26/2021 INITIAL  2 Patient will be able to ambulate community level distances with LRAD in order to improve community access and ability to return to work Baseline: patient using wheel chair for all mobility at evaluation 12/26/2021 INITIAL  3 Patient will exhibit bilateral knee AROM grossly WFL and non-painful to allow for ability to return to cycling Baseline: patient demonstrates limitations with knee AROM 12/26/2021 INITIAL  4 Patient will demonstrate bilateral knee strength 5/5 MMT in order to improve ability to squat/stoop to perform work related tasks Baseline: not formally assessed at evaluation, he demonstrates gross strength deficit with decreased quad control 12/26/2021 INITIAL  5 Patient will report </= 2/10 pain with all activity in order to reduce functional limitation and allow for return to prior level of function Baseline: patient reports increased pain with activity 12/26/2021 INITIAL      PLAN: PT FREQUENCY: 2x/week   PT DURATION: 8 weeks   PLANNED INTERVENTIONS: Therapeutic exercises, Therapeutic activity, Neuro Muscular re-education, Balance training, Gait training, Patient/Family education, Joint  mobilization, Aquatic Therapy, Dry Needling, Electrical stimulation, Cryotherapy, Moist heat, scar mobilization, Taping, Vasopneumatic device, Ionotophoresis 4mg /ml Dexamethasone, and Manual therapy   PLAN FOR NEXT SESSION: Review HEP and progress PRN, gait training progressing to no AD as able, progress strength and balance, knee flexion ROM    , PT, DPT, LAT, ATC 11/16/21  10:45 AM Phone: [phone] Fax: [phone]

## 2021-11-16 ENCOUNTER — Ambulatory Visit: Payer: Medicaid Other | Attending: Orthopedic Surgery | Admitting: Physical Therapy

## 2021-11-16 ENCOUNTER — Encounter: Payer: Self-pay | Admitting: Physical Therapy

## 2021-11-16 ENCOUNTER — Other Ambulatory Visit: Payer: Self-pay

## 2021-11-16 DIAGNOSIS — M25561 Pain in right knee: Secondary | ICD-10-CM | POA: Insufficient documentation

## 2021-11-16 DIAGNOSIS — M6281 Muscle weakness (generalized): Secondary | ICD-10-CM | POA: Diagnosis present

## 2021-11-16 DIAGNOSIS — R2689 Other abnormalities of gait and mobility: Secondary | ICD-10-CM | POA: Diagnosis present

## 2021-11-16 DIAGNOSIS — M25562 Pain in left knee: Secondary | ICD-10-CM | POA: Diagnosis present

## 2021-11-16 DIAGNOSIS — G8929 Other chronic pain: Secondary | ICD-10-CM | POA: Insufficient documentation

## 2021-11-17 NOTE — Therapy (Signed)
OUTPATIENT PHYSICAL THERAPY TREATMENT NOTE   Patient Name: Devin Jones MRN: [id number] DOB:[birth year], 56 y.o., male Today's Date: 11/20/2021  PCP: Gildardo Pounds, NP REFERRING PROVIDER: Gildardo Pounds, NP   PT End of Session - 11/20/21 1029     Visit Number 7    Number of Visits 17    Date for PT Re-Evaluation 12/26/21    Authorization Type Self Pay (MCD Pending)    PT Start Time 1030    PT Stop Time 1115    PT Time Calculation (min) 45 min    Activity Tolerance Patient tolerated treatment well    Behavior During Therapy [facility] for tasks assessed/performed                 Past Medical History:  Diagnosis Date   Alcohol abuse    Past Surgical History:  Procedure Laterality Date   ANTERIOR CRUCIATE LIGAMENT REPAIR Right 09/28/2021   Procedure: ANTERIOR CRUCIATE LIGAMENT (ACL) & POSTERIOR CRUCIATE LIGAMENT (PCL) REPAIR;  Surgeon: Meredith Pel, MD;  Location: [place];  Service: Orthopedics;  Laterality: Right;   COSMETIC SURGERY     HARDWARE REMOVAL Right 09/28/2021   Procedure: HARDWARE REMOVAL;  Surgeon: Meredith Pel, MD;  Location: [place];  Service: Orthopedics;  Laterality: Right;   MEDIAL COLLATERAL LIGAMENT REPAIR, KNEE Right 08/31/2021   Procedure: RIGHT KNEE MEDIAL COLLATERAL LIGAMENT REPAIR/RECONSTRUCTION (ALL OPEN);  Surgeon: Meredith Pel, MD;  Location: [place];  Service: Orthopedics;  Laterality: Right;   ORIF RADIAL FRACTURE Right 08/28/2021   Procedure: OPEN REDUCTION INTERNAL FIXATION (ORIF) RADIAL FRACTURE;  Surgeon: Altamese Neck [place], MD;  Location: [place];  Service: Orthopedics;  Laterality: Right;   SACRO-ILIAC PINNING Right 08/28/2021   Procedure: SACRO-ILIAC SCREW FIXATION WITH  ANTERIOR PELVIC REPAIR;  Surgeon: Altamese Williford, MD;  Location: [place];  Service: Orthopedics;  Laterality: Right;   Patient Active Problem List   Diagnosis Date Noted   Rupture of anterior cruciate ligament of right knee    Rupture of posterior cruciate  ligament of right knee    Complex tear of lateral meniscus of left knee as current injury    Injury of posterolateral corner of knee, left, subsequent encounter    Tear of LCL (lateral collateral ligament) of knee, left, subsequent encounter    Retained orthopedic hardware    Hyperglycemia 10/02/2021   Hypoalbuminemia 10/02/2021   Elevated alkaline phosphatase level 10/02/2021   S/P ACL reconstruction 09/28/2021   Right knee dislocation, subsequent encounter    TBI (traumatic brain injury) 09/07/2021   Multiple trauma 09/07/2021   Pelvic fracture ([place]) 08/26/2021    REFERRING PROVIDER: Meredith Pel, MD  REFERRING DIAG: Right knee dislocation, Status post left knee surgery  THERAPY DIAG:  Chronic pain of right knee  Chronic pain of left knee  Muscle weakness (generalized)  Other abnormalities of gait and mobility  PERTINENT HISTORY: Right knee open MCL reconstruction on 08/28/2021, ACL/PCL reconstruction on 09/28/2020 Left knee LCL and lateral corner reconstruction on 09/28/2020 Unstable pelvic ring fracture s/p ORIF and SI screws (L to R) on 08/28/2021 Right galeazzi fracture dislocation (radius) s/p ORIF 08/28/2021  PRECAUTIONS: Per surgeon ROM as tolerated, no open chain exercises; WBAT  SUBJECTIVE: Patient reports is now using the cane walking outside now, and has been walking without the cane a lot while inside. No new issues noted. States his knees continue to do well and is consistent with his HEP.  PAIN:  Are you having pain? No NPRS  scale: 0/10 (increase in pain with activity) Pain location: Knee Pain orientation: Bilateral PAIN TYPE: Surgical  Pain description: Intermittent, twinge Aggravating factors: Keeping knees static (bent or straight) Relieving factors: Medication  PATIENT GOALS: Get back to walking normally, start cycling again   OBJECTIVE:  MUSCLE LENGTH: Slight limitation of bilateral calf and hamstring, quad flexibility not assessed on  evaluation   LE AROM:   AROM Right 10/31/2021 Left 10/31/2021 Right 11/16/2021 Left 11/16/2021  Knee flexion 105 120 118 120  Knee extension 0 0 0 0    LE MMT: Patient demonstrates SLR without extension lag bilaterally - 11/16/2021   FUNCTIONAL TESTS:  Patient able to perform sit to stand from standard chair with hands on thighs - 11/16/2021  6MWT: 815 using SPC on left side - 11/16/2021 (365 using RW 11/02/2021)   GAIT: - assessed 11/16/2021 Distance walked: 815 Assistive device utilized: [facility] Level of assistance: Modified I Comments: patient demonstrates appropriate heel toe progression, hesitant with turns and continues with decreased gait speed    TODAY'S TREATMENT: 11/20/2021:  Therapeutic Exercise: NuStep L6 x 5 min with LE while taking subjective Leg press (BATCA) 45# 3 x 10 Slant board calf stretch 2 x 30 sec Heel raise 2 x 20 Forward step-up on 6" box 2 x 10 each - contralateral UE support Modified thomas stretch 2 x 60 sec each Prone quad stretch with strap 2 x 30 sec each Sidelying hip abduction 2 x 15 each Wall squat 2 x 10 Neur Re-ed: Tandem stance on Airex 3 x 30 sec each SLS 3 x 20 sec each - occasional touch down for balance correction   11/16/2021:  Therapeutic Exercise: NuStep L6 x 5 min with LE while taking subjective 6MWT using SPC on left - workload capacity training Heel raise 2 x 20 Forward step-up on 6" box 2 x 10 each - contralateral UE support SLR 2 x 10 each Bridge 2 x 10 Sidelying hip abduction 2 x 10 each Wall squat 2 x 10 Neur Re-ed: Tandem stance on Airex 3 x 30 sec each SLS 3 x 15 sec each - occasional touch down for balance correction  11/13/2021:  Therapeutic Exercise: NuStep L6 x 5 min with LE while taking subjective Gait in parallel bars and in clinic with SPC 200 feet - RUE - good safety Heel raise in // bars 2 x 10 Forward step-up on 4" box in // bars2 x 10 each SLR 2 x 10 each Bridge 2 x 10  Sit to stand without UE assist from  elevated table 2 x 10 Neur Re-ed: Tandem stance in // bars 2 x 45 sec each SLS with bilat finger support 1 x 15 sec each   PATIENT EDUCATION:  Education details: HEP update Person educated: Patient Education method: Explanation, Demonstration, Tactile cues, Verbal cues, Handout Education comprehension: verbalized understanding, returned demonstration, verbal cues required, tactile cues required, and needs further education   HOME EXERCISE PROGRAM: Access Code: [email or web address]     ASSESSMENT: CLINICAL IMPRESSION: Patient tolerated therapy well with no adverse effects. Therapy continues to focus primarily on progressing strength in closed chain manner. Patient seems to be progressing well with therapy and denies any increase in pain. He did report hip flexor tightness that has been lingering since hospital stay so incorporated hip flexor and quad stretching with good tolerance and updated HEP. Patient would benefit from continued skilled PT to progress his mobility and strength to return to walking and prior activity level without limitation.  Objective impairments include Abnormal gait, decreased balance, difficulty walking, decreased ROM, decreased strength, increased edema, impaired flexibility, improper body mechanics, and pain.      GOALS: Goals reviewed with patient? Yes   SHORT TERM GOALS:   STG Name Target Date Goal status  1 Patient will be I with initial HEP in order to progress with therapy. Baseline: HEP provided at eval 11/02/2021: Pt reports daily adherence to his HEP 11/28/2021 ACHIEVED  2 Patient will demonstrate SLR without extension lag to indicate improve quad control/strength Baseline: patient exhibits slight extension lag and endurance deficit with SLR 11/16/2021: able to perform SLR without extension lag 11/28/2021 ACHIEVED  3 Patient will achieve >/= 120 deg knee flexion AROM bilaterally to improve transfer ability Baseline: 105 deg right knee flexion 11/16/2021: left 120  deg, right 118 deg 11/28/2021 ONGOING  4 Patient will be able to ambulate household distances using LRAD in order to improve independence and ability to perform self care tasks Baseline: patient is using wheel chair for all mobility 11/02/2021: Pt able to ambulate 365 feet in 6 minutes 11/28/2021 ACHIEVED    LONG TERM GOALS:    LTG Name Target Date Goal status  1 Patient will be I with final HEP to maintain progress from PT. Baseline: HEP provided at eval 12/26/2021 INITIAL  2 Patient will be able to ambulate community level distances with LRAD in order to improve community access and ability to return to work Baseline: patient using wheel chair for all mobility at evaluation 12/26/2021 INITIAL  3 Patient will exhibit bilateral knee AROM grossly WFL and non-painful to allow for ability to return to cycling Baseline: patient demonstrates limitations with knee AROM 12/26/2021 INITIAL  4 Patient will demonstrate bilateral knee strength 5/5 MMT in order to improve ability to squat/stoop to perform work related tasks Baseline: not formally assessed at evaluation, he demonstrates gross strength deficit with decreased quad control 12/26/2021 INITIAL  5 Patient will report </= 2/10 pain with all activity in order to reduce functional limitation and allow for return to prior level of function Baseline: patient reports increased pain with activity 12/26/2021 INITIAL      PLAN: PT FREQUENCY: 2x/week   PT DURATION: 8 weeks   PLANNED INTERVENTIONS: Therapeutic exercises, Therapeutic activity, Neuro Muscular re-education, Balance training, Gait training, Patient/Family education, Joint mobilization, Aquatic Therapy, Dry Needling, Electrical stimulation, Cryotherapy, Moist heat, scar mobilization, Taping, Vasopneumatic device, Ionotophoresis 4mg /ml Dexamethasone, and Manual therapy   PLAN FOR NEXT SESSION: Review HEP and progress PRN, gait training progressing to no AD as able, progress strength and balance,  knee flexion ROM    Hilda Blades, PT, DPT, LAT, ATC 11/20/21  11:18 AM Phone: [phone] Fax: [phone]

## 2021-11-20 ENCOUNTER — Ambulatory Visit: Payer: Medicaid Other | Admitting: Physical Therapy

## 2021-11-20 ENCOUNTER — Other Ambulatory Visit: Payer: Self-pay

## 2021-11-20 ENCOUNTER — Encounter: Payer: Self-pay | Admitting: Physical Therapy

## 2021-11-20 DIAGNOSIS — M25561 Pain in right knee: Secondary | ICD-10-CM

## 2021-11-20 DIAGNOSIS — R2689 Other abnormalities of gait and mobility: Secondary | ICD-10-CM

## 2021-11-20 DIAGNOSIS — G8929 Other chronic pain: Secondary | ICD-10-CM

## 2021-11-20 DIAGNOSIS — M25562 Pain in left knee: Secondary | ICD-10-CM

## 2021-11-20 DIAGNOSIS — M6281 Muscle weakness (generalized): Secondary | ICD-10-CM

## 2021-11-20 NOTE — Patient Instructions (Signed)
Access Code: [email or web address] ?URL: [email or web address] ?Date: 11/20/2021 ?Prepared by: Rosana Hoes ? ?Exercises ?Supine Quadriceps Stretch with Strap on Table - 2-3 x daily - 7 x weekly - 3 reps - 30-60 seconds hold ?Prone Quadriceps Stretch with Strap - 2-3 x daily - 7 x weekly - 3 reps - 30-60 seconds hold ?Long Sitting Quad Set - 5 x daily - 7 x weekly - 10 reps - 5 seconds hold ?Supine Straight Leg Raise with Elbow Support - 2-3 x daily - 7 x weekly - 2 sets - 10 reps ?Long Sitting Calf Stretch with Strap - 2-3 x daily - 7 x weekly - 3 reps - 30 seconds hold ?Hooklying Hamstring Stretch with Strap - 2-3 x daily - 7 x weekly - 3 reps - 30 seconds hold ?Sitting Heel Slide with Towel - 2-3 x daily - 7 x weekly - 10 reps - 5-10 seconds hold ?Long Sitting 4 Way Patellar Glide - 2 x daily - 7 x weekly - 20 reps ?Sidelying Hip Abduction - 2-3 x daily - 7 x weekly - 2 sets - 10 reps ?Mini Squat with Counter Support - 2-3 x daily - 7 x weekly - 2 sets - 10 reps ? ?

## 2021-11-20 NOTE — Therapy (Signed)
OUTPATIENT PHYSICAL THERAPY TREATMENT NOTE   Patient Name: Devin Jones MRN: [id number] DOB:1966-06-16, 56 y.o., male Today's Date: 11/22/2021  PCP: Claiborne Rigg, NP REFERRING PROVIDER: Cammy Copa, MD   PT End of Session - 11/22/21 [id number]     Visit Number 8    Number of Visits 17    Date for PT Re-Evaluation 12/26/21    Authorization Type Self Pay (MCD Pending)    PT Start Time 1000    PT Stop Time 1045    PT Time Calculation (min) 45 min    Activity Tolerance Patient tolerated treatment well    Behavior During Therapy [facility] for tasks assessed/performed                  Past Medical History:  Diagnosis Date   Alcohol abuse    Past Surgical History:  Procedure Laterality Date   ANTERIOR CRUCIATE LIGAMENT REPAIR Right 09/28/2021   Procedure: ANTERIOR CRUCIATE LIGAMENT (ACL) & POSTERIOR CRUCIATE LIGAMENT (PCL) REPAIR;  Surgeon: Cammy Copa, MD;  Location: [facility];  Service: Orthopedics;  Laterality: Right;   COSMETIC SURGERY     HARDWARE REMOVAL Right 09/28/2021   Procedure: HARDWARE REMOVAL;  Surgeon: Cammy Copa, MD;  Location: [facility] OR;  Service: Orthopedics;  Laterality: Right;   MEDIAL COLLATERAL LIGAMENT REPAIR, KNEE Right 08/31/2021   Procedure: RIGHT KNEE MEDIAL COLLATERAL LIGAMENT REPAIR/RECONSTRUCTION (ALL OPEN);  Surgeon: Cammy Copa, MD;  Location: [facility] OR;  Service: Orthopedics;  Laterality: Right;   ORIF RADIAL FRACTURE Right 08/28/2021   Procedure: OPEN REDUCTION INTERNAL FIXATION (ORIF) RADIAL FRACTURE;  Surgeon: Myrene Galas, MD;  Location: [facility];  Service: Orthopedics;  Laterality: Right;   SACRO-ILIAC PINNING Right 08/28/2021   Procedure: SACRO-ILIAC SCREW FIXATION WITH  ANTERIOR PELVIC REPAIR;  Surgeon: Myrene Galas, MD;  Location: [facility];  Service: Orthopedics;  Laterality: Right;   Patient Active Problem List   Diagnosis Date Noted   Rupture of anterior cruciate ligament of right knee    Rupture of posterior  cruciate ligament of right knee    Complex tear of lateral meniscus of left knee as current injury    Injury of posterolateral corner of knee, left, subsequent encounter    Tear of LCL (lateral collateral ligament) of knee, left, subsequent encounter    Retained orthopedic hardware    Hyperglycemia 10/02/2021   Hypoalbuminemia 10/02/2021   Elevated alkaline phosphatase level 10/02/2021   S/P ACL reconstruction 09/28/2021   Right knee dislocation, subsequent encounter    TBI (traumatic brain injury) 09/07/2021   Multiple trauma 09/07/2021   Pelvic fracture ([facility]) 08/26/2021    REFERRING PROVIDER: Cammy Copa, MD  REFERRING DIAG: Right knee dislocation, Status post left knee surgery  THERAPY DIAG:  Chronic pain of right knee  Chronic pain of left knee  Muscle weakness (generalized)  Other abnormalities of gait and mobility  PERTINENT HISTORY: Right knee open MCL reconstruction on 08/28/2021, ACL/PCL reconstruction on 09/28/2020 Left knee LCL and lateral corner reconstruction on 09/28/2020 Unstable pelvic ring fracture s/p ORIF and SI screws (L to R) on 08/28/2021 Right galeazzi fracture dislocation (radius) s/p ORIF 08/28/2021  PRECAUTIONS: Per surgeon ROM as tolerated, no open chain exercises; WBAT  SUBJECTIVE: Patient reports is doing well, states the new stretches from last visit helped with the hip flexor tightness.   PAIN:  Are you having pain? No NPRS scale: 0/10 (increase in pain with activity) Pain location: Knee Pain orientation: Bilateral PAIN TYPE: Surgical  Pain  description: Intermittent, twinge Aggravating factors: Keeping knees static (bent or straight) Relieving factors: Medication  PATIENT GOALS: Get back to walking normally, start cycling again   OBJECTIVE:  MUSCLE LENGTH: Slight limitation of bilateral calf and hamstring, quad flexibility not assessed on evaluation   LE AROM:   AROM Right 10/31/2021 Left 10/31/2021 Right 11/16/2021  Left 11/16/2021  Knee flexion 105 120 118 120  Knee extension 0 0 0 0    LE MMT: Patient demonstrates SLR without extension lag bilaterally - 11/16/2021   FUNCTIONAL TESTS:  Patient able to perform sit to stand from standard chair with hands on thighs - 11/16/2021  : 815 using SPC on left side - 11/16/2021 (365 using RW 11/02/2021)   GAIT: - assessed 11/16/2021 Distance walked: 815 Assistive device utilized: [facility] Level of assistance: Modified I Comments: patient demonstrates appropriate heel toe progression, hesitant with turns and continues with decreased gait speed    TODAY'S TREATMENT: 11/22/2021:  Therapeutic Exercise: NuStep L6 x 5 min with LE while taking subjective Leg press (BATCA) 55# 3 x 15 Heel raise 2 x 20 Standing hip abduction and extension with red band at knee 2 x 15 each - stance leg on Airex with BUE fingertip support Forward step-up on 6" box 3 x 10 each - contralateral UE fingertip support Wall squat 2 x 10 Neur Re-ed: Tandem stance on Airex 3 x 30 sec each Rockerboard sagittal and frontal plane taps 2 x 1 min each SLS 3 x 20 sec each - occasional touch down for balance correction   11/20/2021:  Therapeutic Exercise: NuStep L6 x 5 min with LE while taking subjective Leg press (BATCA) 45# 3 x 10 Slant board calf stretch 2 x 30 sec Heel raise 2 x 20 Forward step-up on 6" box 2 x 10 each - contralateral UE support Modified thomas stretch 2 x 60 sec each Prone quad stretch with strap 2 x 30 sec each Sidelying hip abduction 2 x 15 each Wall squat 2 x 10 Neur Re-ed: Tandem stance on Airex 3 x 30 sec each SLS 3 x 20 sec each - occasional touch down for balance correction  11/16/2021:  Therapeutic Exercise: NuStep L6 x 5 min with LE while taking subjective using SPC on left - workload capacity training Heel raise 2 x 20 Forward step-up on 6" box 2 x 10 each - contralateral UE support SLR 2 x 10 each Bridge 2 x 10 Sidelying hip abduction 2 x 10 each Wall  squat 2 x 10 Neur Re-ed: Tandem stance on Airex 3 x 30 sec each SLS 3 x 15 sec each - occasional touch down for balance correction   PATIENT EDUCATION:  Education details: HEP Person educated: Patient Education method: Programmer, multimedia, Demonstration, Actor cues, Verbal cues Education comprehension: verbalized understanding, returned demonstration, verbal cues required, tactile cues required, and needs further education   HOME EXERCISE PROGRAM: Access Code: [email or web address]     ASSESSMENT: CLINICAL IMPRESSION: Patient tolerated therapy well with no adverse effects. Therapy with focus on progressing strength and balance with good tolerance. He is progressing well with his closed chain strengthening and reports feeling more steady with his walking. Progressed with balance training this visit with further unstable surface training. No pain reported with therapy. Patient would benefit from continued skilled PT to progress his mobility and strength to return to walking and prior activity level without limitation.  Objective impairments include Abnormal gait, decreased balance, difficulty walking, decreased ROM, decreased strength, increased edema, impaired flexibility, improper  body mechanics, and pain.      GOALS: Goals reviewed with patient? Yes   SHORT TERM GOALS:   STG Name Target Date Goal status  1 Patient will be I with initial HEP in order to progress with therapy. Baseline: HEP provided at eval 11/02/2021: Pt reports daily adherence to his HEP 11/28/2021 ACHIEVED  2 Patient will demonstrate SLR without extension lag to indicate improve quad control/strength Baseline: patient exhibits slight extension lag and endurance deficit with SLR 11/16/2021: able to perform SLR without extension lag 11/28/2021 ACHIEVED  3 Patient will achieve >/= 120 deg knee flexion AROM bilaterally to improve transfer ability Baseline: 105 deg right knee flexion 11/16/2021: left 120 deg, right 118 deg 11/28/2021 ONGOING   4 Patient will be able to ambulate household distances using LRAD in order to improve independence and ability to perform self care tasks Baseline: patient is using wheel chair for all mobility 11/02/2021: Pt able to ambulate 365 feet in 6 minutes 11/28/2021 ACHIEVED    LONG TERM GOALS:    LTG Name Target Date Goal status  1 Patient will be I with final HEP to maintain progress from PT. Baseline: HEP provided at eval 12/26/2021 INITIAL  2 Patient will be able to ambulate community level distances with LRAD in order to improve community access and ability to return to work Baseline: patient using wheel chair for all mobility at evaluation 12/26/2021 INITIAL  3 Patient will exhibit bilateral knee AROM grossly WFL and non-painful to allow for ability to return to cycling Baseline: patient demonstrates limitations with knee AROM 12/26/2021 INITIAL  4 Patient will demonstrate bilateral knee strength 5/5 MMT in order to improve ability to squat/stoop to perform work related tasks Baseline: not formally assessed at evaluation, he demonstrates gross strength deficit with decreased quad control 12/26/2021 INITIAL  5 Patient will report </= 2/10 pain with all activity in order to reduce functional limitation and allow for return to prior level of function Baseline: patient reports increased pain with activity 12/26/2021 INITIAL      PLAN: PT FREQUENCY: 2x/week   PT DURATION: 8 weeks   PLANNED INTERVENTIONS: Therapeutic exercises, Therapeutic activity, Neuro Muscular re-education, Balance training, Gait training, Patient/Family education, Joint mobilization, Aquatic Therapy, Dry Needling, Electrical stimulation, Cryotherapy, Moist heat, scar mobilization, Taping, Vasopneumatic device, Ionotophoresis 4mg /ml Dexamethasone, and Manual therapy   PLAN FOR NEXT SESSION: Review HEP and progress PRN, gait training progressing to no AD as able, progress strength and balance, knee flexion ROM    Rosana Hoes,  PT, DPT, LAT, ATC 11/22/21  10:46 AM Phone: [phone] Fax: [phone]

## 2021-11-22 ENCOUNTER — Ambulatory Visit: Payer: Medicaid Other | Admitting: Physical Therapy

## 2021-11-22 ENCOUNTER — Encounter: Payer: Self-pay | Admitting: Physical Therapy

## 2021-11-22 ENCOUNTER — Other Ambulatory Visit: Payer: Self-pay

## 2021-11-22 DIAGNOSIS — M25561 Pain in right knee: Secondary | ICD-10-CM | POA: Diagnosis not present

## 2021-11-22 DIAGNOSIS — M6281 Muscle weakness (generalized): Secondary | ICD-10-CM

## 2021-11-22 DIAGNOSIS — G8929 Other chronic pain: Secondary | ICD-10-CM

## 2021-11-22 DIAGNOSIS — R2689 Other abnormalities of gait and mobility: Secondary | ICD-10-CM

## 2021-11-22 NOTE — Patient Instructions (Signed)
Access Code: [email or web address] ?URL: [email or web address] ?Date: 11/22/2021 ?Prepared by: Rosana Hoes ? ?Exercises ?Supine Quadriceps Stretch with Strap on Table - 2-3 x daily - 7 x weekly - 3 reps - 30-60 seconds hold ?Prone Quadriceps Stretch with Strap - 2-3 x daily - 7 x weekly - 3 reps - 30-60 seconds hold ?Supine Straight Leg Raise with Elbow Support - 2-3 x daily - 7 x weekly - 2 sets - 10 reps ?Long Sitting Calf Stretch with Strap - 2-3 x daily - 7 x weekly - 3 reps - 30 seconds hold ?Hooklying Hamstring Stretch with Strap - 2-3 x daily - 7 x weekly - 3 reps - 30 seconds hold ?Sitting Heel Slide with Towel - 2-3 x daily - 7 x weekly - 10 reps - 5-10 seconds hold ?Long Sitting 4 Way Patellar Glide - 2 x daily - 7 x weekly - 20 reps ?Sidelying Hip Abduction - 2-3 x daily - 7 x weekly - 2 sets - 10 reps ?Mini Squat with Counter Support - 2-3 x daily - 7 x weekly - 2 sets - 10 reps ?Wall Squat - 1-2 x daily - 7 x weekly - 3 sets - 10 reps ? ?

## 2021-11-23 ENCOUNTER — Telehealth: Payer: Self-pay

## 2021-11-23 NOTE — Telephone Encounter (Signed)
SW received fax on 11/22/2021 from [employer] (third party administrator) for Short and long term disability claims which would require physician to complete. CIR physician unable to complete as pt is not scheduled to be seen until 4/5; and encouraged follow-up with orthopedic physician to complete.  ? ?11/23/2021-SW left message for pt sharing above, and encouraged follow-up if needed.  ? ?Case closed to SW. ? ?Cecile Sheerer, MSW, LCSWA ?Office: [phone] ?Cell: [phone] ?Fax: [phone]  ?

## 2021-11-25 NOTE — Therapy (Signed)
OUTPATIENT PHYSICAL THERAPY TREATMENT NOTE   Patient Name: Devin Jones MRN: [id number] DOB:1965/12/11, 56 y.o., male Today's Date: 11/27/2021  PCP: Claiborne Rigg, NP REFERRING PROVIDER: Cammy Copa, MD   PT End of Session - 11/27/21 0940     Visit Number 9    Number of Visits 17    Date for PT Re-Evaluation 12/26/21    Authorization Type Self Pay (MCD Pending)    PT Start Time 1000    PT Stop Time 1045    PT Time Calculation (min) 45 min    Activity Tolerance Patient tolerated treatment well    Behavior During Therapy [facility] for tasks assessed/performed                   Past Medical History:  Diagnosis Date   Alcohol abuse    Past Surgical History:  Procedure Laterality Date   ANTERIOR CRUCIATE LIGAMENT REPAIR Right 09/28/2021   Procedure: ANTERIOR CRUCIATE LIGAMENT (ACL) & POSTERIOR CRUCIATE LIGAMENT (PCL) REPAIR;  Surgeon: Cammy Copa, MD;  Location: [facility] OR;  Service: Orthopedics;  Laterality: Right;   COSMETIC SURGERY     HARDWARE REMOVAL Right 09/28/2021   Procedure: HARDWARE REMOVAL;  Surgeon: Cammy Copa, MD;  Location: [facility] OR;  Service: Orthopedics;  Laterality: Right;   MEDIAL COLLATERAL LIGAMENT REPAIR, KNEE Right 08/31/2021   Procedure: RIGHT KNEE MEDIAL COLLATERAL LIGAMENT REPAIR/RECONSTRUCTION (ALL OPEN);  Surgeon: Cammy Copa, MD;  Location: [facility] OR;  Service: Orthopedics;  Laterality: Right;   ORIF RADIAL FRACTURE Right 08/28/2021   Procedure: OPEN REDUCTION INTERNAL FIXATION (ORIF) RADIAL FRACTURE;  Surgeon: Myrene Galas, MD;  Location: [facility] OR;  Service: Orthopedics;  Laterality: Right;   SACRO-ILIAC PINNING Right 08/28/2021   Procedure: SACRO-ILIAC SCREW FIXATION WITH  ANTERIOR PELVIC REPAIR;  Surgeon: Myrene Galas, MD;  Location: [facility] OR;  Service: Orthopedics;  Laterality: Right;   Patient Active Problem List   Diagnosis Date Noted   Rupture of anterior cruciate ligament of right knee    Rupture of posterior  cruciate ligament of right knee    Complex tear of lateral meniscus of left knee as current injury    Injury of posterolateral corner of knee, left, subsequent encounter    Tear of LCL (lateral collateral ligament) of knee, left, subsequent encounter    Retained orthopedic hardware    Hyperglycemia 10/02/2021   Hypoalbuminemia 10/02/2021   Elevated alkaline phosphatase level 10/02/2021   S/P ACL reconstruction 09/28/2021   Right knee dislocation, subsequent encounter    TBI (traumatic brain injury) 09/07/2021   Multiple trauma 09/07/2021   Pelvic fracture (HCC) 08/26/2021    REFERRING PROVIDER: Cammy Copa, MD  REFERRING DIAG: Right knee dislocation, Status post left knee surgery  THERAPY DIAG:  Chronic pain of right knee  Chronic pain of left knee  Muscle weakness (generalized)  Other abnormalities of gait and mobility  PERTINENT HISTORY: Right knee open MCL reconstruction on 08/28/2021, ACL/PCL reconstruction on 09/28/2020 Left knee LCL and lateral corner reconstruction on 09/28/2020 Unstable pelvic ring fracture s/p ORIF and SI screws (L to R) on 08/28/2021 Right galeazzi fracture dislocation (radius) s/p ORIF 08/28/2021  PRECAUTIONS: Per surgeon ROM as tolerated, no open chain exercises; WBAT  SUBJECTIVE: Patient states that the hip flexor stretches have allowed him to feel and walk much better. He does get some fatigue with the exercises but denies any pain.  PAIN:  Are you having pain? No NPRS scale: 0/10 (increase in pain with activity) Pain  location: Knee Pain orientation: Bilateral PAIN TYPE: Surgical  Pain description: Intermittent, twinge Aggravating factors: Keeping knees static (bent or straight) Relieving factors: Medication  PATIENT GOALS: Get back to walking normally, start cycling again   OBJECTIVE:  MUSCLE LENGTH: Slight limitation of bilateral calf and hamstring, quad flexibility not assessed on evaluation   LE AROM:   AROM  Right 10/31/2021 Left 10/31/2021 Right 11/27/2021 Left 11/27/2021  Knee flexion 105 120 118 133  Knee extension 0 0 0 0    LE MMT: Patient demonstrates SLR without extension lag bilaterally - 11/16/2021   FUNCTIONAL TESTS:  Patient able to perform sit to stand from standard chair with hands on thighs - 11/16/2021  : 815 using SPC on left side - 11/16/2021 (365 using RW 11/02/2021)   GAIT: - assessed 11/16/2021 Distance walked: 815 Assistive device utilized: [facility] Level of assistance: Modified I Comments: patient demonstrates appropriate heel toe progression, hesitant with turns and continues with decreased gait speed    TODAY'S TREATMENT: 11/27/2021:  Therapeutic Exercise: NuStep L6 x 5 min with LE while taking subjective Leg press (BATCA) 65# 3 x 15 Standing hip abduction and extension with red band at knee 2 x 10 each - stance leg on Airex with BUE fingertip support Forward heel tap on 4" box 2 x 10 each - contralateral UE fingertip support Resisted retro walking with FM 10# x 5 Resisted lateral walking with FM 10# x 3 each Neur Re-ed: Tandem stance on Airex 3 x 30 sec each Rockerboard sagittal and frontal plane taps x 2 min each SLS 3 x 30 sec each   11/22/2021:  Therapeutic Exercise: NuStep L6 x 5 min with LE while taking subjective Leg press (BATCA) 55# 3 x 15 Heel raise 2 x 20 Standing hip abduction and extension with red band at knee 2 x 15 each - stance leg on Airex with BUE fingertip support Forward step-up on 6" box 3 x 10 each - contralateral UE fingertip support Wall squat 2 x 10 Neur Re-ed: Tandem stance on Airex 3 x 30 sec each Rockerboard sagittal and frontal plane taps 2 x 1 min each SLS 3 x 20 sec each - occasional touch down for balance correction  11/20/2021:  Therapeutic Exercise: NuStep L6 x 5 min with LE while taking subjective Leg press (BATCA) 45# 3 x 10 Slant board calf stretch 2 x 30 sec Heel raise 2 x 20 Forward step-up on 6" box 2 x 10 each -  contralateral UE support Modified thomas stretch 2 x 60 sec each Prone quad stretch with strap 2 x 30 sec each Sidelying hip abduction 2 x 15 each Wall squat 2 x 10 Neur Re-ed: Tandem stance on Airex 3 x 30 sec each SLS 3 x 20 sec each - occasional touch down for balance correction   PATIENT EDUCATION:  Education details: HEP Person educated: Patient Education method: Programmer, multimedia, Demonstration, Actor cues, Verbal cues Education comprehension: verbalized understanding, returned demonstration, verbal cues required, tactile cues required, and needs further education   HOME EXERCISE PROGRAM: Access Code: [email or web address]     ASSESSMENT: CLINICAL IMPRESSION: Patient tolerated therapy well with no adverse effects. Therapy focused on continued strengthening and balance training to improve walking and mobility. Incorporated resisted walking this visit to improve stability and strength. Patient is progressing well with his balance and did not require any UE support with SLS this visit. No changes to HEP. Patient would benefit from continued skilled PT to progress his mobility and strength to  return to walking and prior activity level without limitation.  Objective impairments include Abnormal gait, decreased balance, difficulty walking, decreased ROM, decreased strength, increased edema, impaired flexibility, improper body mechanics, and pain.      GOALS: Goals reviewed with patient? Yes   SHORT TERM GOALS:   STG Name Target Date Goal status  1 Patient will be I with initial HEP in order to progress with therapy. Baseline: HEP provided at eval 11/02/2021: Pt reports daily adherence to his HEP 11/28/2021 ACHIEVED  2 Patient will demonstrate SLR without extension lag to indicate improve quad control/strength Baseline: patient exhibits slight extension lag and endurance deficit with SLR 11/16/2021: able to perform SLR without extension lag 11/28/2021 ACHIEVED  3 Patient will achieve >/= 120 deg knee  flexion AROM bilaterally to improve transfer ability Baseline: 105 deg right knee flexion 11/16/2021: left 120 deg, right 118 deg 11/27/2021: left 133 deg, right 118 deg 11/28/2021 ONGOING  4 Patient will be able to ambulate household distances using LRAD in order to improve independence and ability to perform self care tasks Baseline: patient is using wheel chair for all mobility 11/02/2021: Pt able to ambulate 365 feet in 6 minutes 11/28/2021 ACHIEVED    LONG TERM GOALS:    LTG Name Target Date Goal status  1 Patient will be I with final HEP to maintain progress from PT. Baseline: HEP provided at eval 12/26/2021 INITIAL  2 Patient will be able to ambulate community level distances with LRAD in order to improve community access and ability to return to work Baseline: patient using wheel chair for all mobility at evaluation 12/26/2021 INITIAL  3 Patient will exhibit bilateral knee AROM grossly WFL and non-painful to allow for ability to return to cycling Baseline: patient demonstrates limitations with knee AROM 12/26/2021 INITIAL  4 Patient will demonstrate bilateral knee strength 5/5 MMT in order to improve ability to squat/stoop to perform work related tasks Baseline: not formally assessed at evaluation, he demonstrates gross strength deficit with decreased quad control 12/26/2021 INITIAL  5 Patient will report </= 2/10 pain with all activity in order to reduce functional limitation and allow for return to prior level of function Baseline: patient reports increased pain with activity 12/26/2021 INITIAL      PLAN: PT FREQUENCY: 2x/week   PT DURATION: 8 weeks   PLANNED INTERVENTIONS: Therapeutic exercises, Therapeutic activity, Neuro Muscular re-education, Balance training, Gait training, Patient/Family education, Joint mobilization, Aquatic Therapy, Dry Needling, Electrical stimulation, Cryotherapy, Moist heat, scar mobilization, Taping, Vasopneumatic device, Ionotophoresis 4mg /ml Dexamethasone,  and Manual therapy   PLAN FOR NEXT SESSION: Review HEP and progress PRN, gait training progressing to no AD as able, progress strength and balance, knee flexion ROM    Hilda Blades, PT, DPT, LAT, ATC 11/27/21  10:45 AM Phone: [phone] Fax: [phone]

## 2021-11-27 ENCOUNTER — Other Ambulatory Visit: Payer: Self-pay

## 2021-11-27 ENCOUNTER — Ambulatory Visit: Payer: Medicaid Other | Admitting: Physical Therapy

## 2021-11-27 ENCOUNTER — Encounter: Payer: Self-pay | Admitting: Physical Therapy

## 2021-11-27 DIAGNOSIS — M25562 Pain in left knee: Secondary | ICD-10-CM

## 2021-11-27 DIAGNOSIS — R2689 Other abnormalities of gait and mobility: Secondary | ICD-10-CM

## 2021-11-27 DIAGNOSIS — M6281 Muscle weakness (generalized): Secondary | ICD-10-CM

## 2021-11-27 DIAGNOSIS — M25561 Pain in right knee: Secondary | ICD-10-CM

## 2021-11-27 DIAGNOSIS — G8929 Other chronic pain: Secondary | ICD-10-CM

## 2021-11-27 NOTE — Therapy (Incomplete)
OUTPATIENT PHYSICAL THERAPY TREATMENT NOTE   Patient Name: Devin Jones MRN: [id number] DOB:[birth year], 56 y.o., male Today's Date: 11/27/2021  PCP: Claiborne Rigg, NP REFERRING PROVIDER: Cammy Copa, MD           Past Medical History:  Diagnosis Date   Alcohol abuse    Past Surgical History:  Procedure Laterality Date   ANTERIOR CRUCIATE LIGAMENT REPAIR Right 09/28/2021   Procedure: ANTERIOR CRUCIATE LIGAMENT (ACL) & POSTERIOR CRUCIATE LIGAMENT (PCL) REPAIR;  Surgeon: Cammy Copa, MD;  Location: [facility] OR;  Service: Orthopedics;  Laterality: Right;   COSMETIC SURGERY     HARDWARE REMOVAL Right 09/28/2021   Procedure: HARDWARE REMOVAL;  Surgeon: Cammy Copa, MD;  Location: [facility] OR;  Service: Orthopedics;  Laterality: Right;   MEDIAL COLLATERAL LIGAMENT REPAIR, KNEE Right 08/31/2021   Procedure: RIGHT KNEE MEDIAL COLLATERAL LIGAMENT REPAIR/RECONSTRUCTION (ALL OPEN);  Surgeon: Cammy Copa, MD;  Location: [facility] OR;  Service: Orthopedics;  Laterality: Right;   ORIF RADIAL FRACTURE Right 08/28/2021   Procedure: OPEN REDUCTION INTERNAL FIXATION (ORIF) RADIAL FRACTURE;  Surgeon: Myrene Galas, MD;  Location: [facility] OR;  Service: Orthopedics;  Laterality: Right;   SACRO-ILIAC PINNING Right 08/28/2021   Procedure: SACRO-ILIAC SCREW FIXATION WITH  ANTERIOR PELVIC REPAIR;  Surgeon: Myrene Galas, MD;  Location: [facility] OR;  Service: Orthopedics;  Laterality: Right;   Patient Active Problem List   Diagnosis Date Noted   Rupture of anterior cruciate ligament of right knee    Rupture of posterior cruciate ligament of right knee    Complex tear of lateral meniscus of left knee as current injury    Injury of posterolateral corner of knee, left, subsequent encounter    Tear of LCL (lateral collateral ligament) of knee, left, subsequent encounter    Retained orthopedic hardware    Hyperglycemia 10/02/2021   Hypoalbuminemia 10/02/2021   Elevated alkaline phosphatase  level 10/02/2021   S/P ACL reconstruction 09/28/2021   Right knee dislocation, subsequent encounter    TBI (traumatic brain injury) 09/07/2021   Multiple trauma 09/07/2021   Pelvic fracture (HCC) 08/26/2021    REFERRING PROVIDER: Cammy Copa, MD  REFERRING DIAG: Right knee dislocation, Status post left knee surgery  THERAPY DIAG:  No diagnosis found.  PERTINENT HISTORY: Right knee open MCL reconstruction on 08/28/2021, ACL/PCL reconstruction on 09/28/2020 Left knee LCL and lateral corner reconstruction on 09/28/2020 Unstable pelvic ring fracture s/p ORIF and SI screws (L to R) on 08/28/2021 Right galeazzi fracture dislocation (radius) s/p ORIF 08/28/2021  PRECAUTIONS: Per surgeon ROM as tolerated, no open chain exercises; WBAT  SUBJECTIVE: Patient states that the hip flexor stretches have allowed him to feel and walk much better. He does get some fatigue with the exercises but denies any pain.  PAIN:  Are you having pain? No NPRS scale: 0/10 (increase in pain with activity) Pain location: Knee Pain orientation: Bilateral PAIN TYPE: Surgical  Pain description: Intermittent, twinge Aggravating factors: Keeping knees static (bent or straight) Relieving factors: Medication  PATIENT GOALS: Get back to walking normally, start cycling again   OBJECTIVE:  MUSCLE LENGTH: Slight limitation of bilateral calf and hamstring, quad flexibility not assessed on evaluation   LE AROM:   AROM Right 10/31/2021 Left 10/31/2021 Right 11/27/2021 Left 11/27/2021  Knee flexion 105 120 118 133  Knee extension 0 0 0 0    LE MMT: Patient demonstrates SLR without extension lag bilaterally - 11/16/2021   FUNCTIONAL TESTS:  Patient able to perform sit to stand  from standard chair with hands on thighs - 11/16/2021  6MWT: 815 using SPC on left side - 11/16/2021 (365 using RW 11/02/2021)   GAIT: - assessed 11/16/2021 Distance walked: 815 Assistive device utilized: SPC Level of assistance:  Modified I Comments: patient demonstrates appropriate heel toe progression, hesitant with turns and continues with decreased gait speed    TODAY'S TREATMENT: 11/29/2021:  Therapeutic Exercise: NuStep L6 x 5 min with LE while taking subjective Leg press (BATCA) 65# 3 x 15 Standing hip abduction and extension with red band at knee 2 x 10 each - stance leg on Airex with BUE fingertip support Forward heel tap on 4" box 2 x 10 each - contralateral UE fingertip support Resisted retro walking with FM 10# x 5 Resisted lateral walking with FM 10# x 3 each Neur Re-ed: Tandem stance on Airex 3 x 30 sec each Rockerboard sagittal and frontal plane taps x 2 min each SLS 3 x 30 sec each   11/27/2021:  Therapeutic Exercise: NuStep L6 x 5 min with LE while taking subjective Leg press (BATCA) 65# 3 x 15 Standing hip abduction and extension with red band at knee 2 x 10 each - stance leg on Airex with BUE fingertip support Forward heel tap on 4" box 2 x 10 each - contralateral UE fingertip support Resisted retro walking with FM 10# x 5 Resisted lateral walking with FM 10# x 3 each Neur Re-ed: Tandem stance on Airex 3 x 30 sec each Rockerboard sagittal and frontal plane taps x 2 min each SLS 3 x 30 sec each  11/22/2021:  Therapeutic Exercise: NuStep L6 x 5 min with LE while taking subjective Leg press (BATCA) 55# 3 x 15 Heel raise 2 x 20 Standing hip abduction and extension with red band at knee 2 x 15 each - stance leg on Airex with BUE fingertip support Forward step-up on 6" box 3 x 10 each - contralateral UE fingertip support Wall squat 2 x 10 Neur Re-ed: Tandem stance on Airex 3 x 30 sec each Rockerboard sagittal and frontal plane taps 2 x 1 min each SLS 3 x 20 sec each - occasional touch down for balance correction   PATIENT EDUCATION:  Education details: HEP Person educated: Patient Education method: Programmer, multimediaxplanation, Demonstration, ActorTactile cues, Verbal cues Education comprehension:  verbalized understanding, returned demonstration, verbal cues required, tactile cues required, and needs further education   HOME EXERCISE PROGRAM: Access Code: [email or web address]     ASSESSMENT: CLINICAL IMPRESSION: Patient tolerated therapy well with no adverse effects. *** Patient would benefit from continued skilled PT to progress his mobility and strength to return to walking and prior activity level without limitation.  Therapy focused on continued strengthening and balance training to improve walking and mobility. Incorporated resisted walking this visit to improve stability and strength. Patient is progressing well with his balance and did not require any UE support with SLS this visit. No changes to HEP.   Objective impairments include Abnormal gait, decreased balance, difficulty walking, decreased ROM, decreased strength, increased edema, impaired flexibility, improper body mechanics, and pain.      GOALS: Goals reviewed with patient? Yes   SHORT TERM GOALS:   STG Name Target Date Goal status  1 Patient will be I with initial HEP in order to progress with therapy. Baseline: HEP provided at eval 11/02/2021: Pt reports daily adherence to his HEP 11/28/2021 ACHIEVED  2 Patient will demonstrate SLR without extension lag to indicate improve quad control/strength Baseline: patient exhibits slight  extension lag and endurance deficit with SLR 11/16/2021: able to perform SLR without extension lag 11/28/2021 ACHIEVED  3 Patient will achieve >/= 120 deg knee flexion AROM bilaterally to improve transfer ability Baseline: 105 deg right knee flexion 11/16/2021: left 120 deg, right 118 deg 11/27/2021: left 133 deg, right 118 deg 11/28/2021 ONGOING  4 Patient will be able to ambulate household distances using LRAD in order to improve independence and ability to perform self care tasks Baseline: patient is using wheel chair for all mobility 11/02/2021: Pt able to ambulate 365 feet in 6 minutes 11/28/2021  ACHIEVED    LONG TERM GOALS:    LTG Name Target Date Goal status  1 Patient will be I with final HEP to maintain progress from PT. Baseline: HEP provided at eval 12/26/2021 INITIAL  2 Patient will be able to ambulate community level distances with LRAD in order to improve community access and ability to return to work Baseline: patient using wheel chair for all mobility at evaluation 12/26/2021 INITIAL  3 Patient will exhibit bilateral knee AROM grossly WFL and non-painful to allow for ability to return to cycling Baseline: patient demonstrates limitations with knee AROM 12/26/2021 INITIAL  4 Patient will demonstrate bilateral knee strength 5/5 MMT in order to improve ability to squat/stoop to perform work related tasks Baseline: not formally assessed at evaluation, he demonstrates gross strength deficit with decreased quad control 12/26/2021 INITIAL  5 Patient will report </= 2/10 pain with all activity in order to reduce functional limitation and allow for return to prior level of function Baseline: patient reports increased pain with activity 12/26/2021 INITIAL      PLAN: PT FREQUENCY: 2x/week   PT DURATION: 8 weeks   PLANNED INTERVENTIONS: Therapeutic exercises, Therapeutic activity, Neuro Muscular re-education, Balance training, Gait training, Patient/Family education, Joint mobilization, Aquatic Therapy, Dry Needling, Electrical stimulation, Cryotherapy, Moist heat, scar mobilization, Taping, Vasopneumatic device, Ionotophoresis 4mg /ml Dexamethasone, and Manual therapy   PLAN FOR NEXT SESSION: Review HEP and progress PRN, gait training progressing to no AD as able, progress strength and balance, knee flexion ROM    , PT, DPT, LAT, ATC 11/27/21  4:51 PM Phone: [phone] Fax: [phone]

## 2021-11-29 ENCOUNTER — Encounter: Payer: Self-pay | Admitting: Physical Therapy

## 2021-11-29 ENCOUNTER — Other Ambulatory Visit: Payer: Self-pay

## 2021-11-29 ENCOUNTER — Ambulatory Visit: Payer: Medicaid Other | Admitting: Physical Therapy

## 2021-11-29 DIAGNOSIS — R2689 Other abnormalities of gait and mobility: Secondary | ICD-10-CM

## 2021-11-29 DIAGNOSIS — M6281 Muscle weakness (generalized): Secondary | ICD-10-CM

## 2021-11-29 DIAGNOSIS — G8929 Other chronic pain: Secondary | ICD-10-CM

## 2021-11-29 DIAGNOSIS — M25561 Pain in right knee: Secondary | ICD-10-CM | POA: Diagnosis not present

## 2021-12-07 ENCOUNTER — Ambulatory Visit: Payer: Medicaid Other | Admitting: Physical Therapy

## 2021-12-07 ENCOUNTER — Encounter: Payer: Self-pay | Admitting: Physical Therapy

## 2021-12-07 ENCOUNTER — Other Ambulatory Visit: Payer: Self-pay

## 2021-12-07 DIAGNOSIS — M25561 Pain in right knee: Secondary | ICD-10-CM | POA: Diagnosis not present

## 2021-12-07 DIAGNOSIS — G8929 Other chronic pain: Secondary | ICD-10-CM

## 2021-12-07 DIAGNOSIS — M6281 Muscle weakness (generalized): Secondary | ICD-10-CM

## 2021-12-07 NOTE — Therapy (Signed)
?OUTPATIENT PHYSICAL THERAPY TREATMENT NOTE ? ? ?Patient Name: Devin Jones ?MRN: [id number] ?DOB:[birth year], 56 y.o., male ?Today's Date: 12/07/2021 ? ?PCP: Claiborne Rigg, NP ?REFERRING PROVIDER: Cammy Copa, MD ? ? PT End of Session - 12/07/21 1024   ? ? Visit Number 11   ? Number of Visits 17   ? Date for PT Re-Evaluation 12/26/21   ? Authorization Type Self Pay (MCD Pending)   ? PT Start Time 1015   ? PT Stop Time 1100   ? PT Time Calculation (min) 45 min   ? ?  ?  ? ?  ? ? ? ? ? ? ? ? ? ?Past Medical History:  ?Diagnosis Date  ? Alcohol abuse   ? ?Past Surgical History:  ?Procedure Laterality Date  ? ANTERIOR CRUCIATE LIGAMENT REPAIR Right 09/28/2021  ? Procedure: ANTERIOR CRUCIATE LIGAMENT (ACL) & POSTERIOR CRUCIATE LIGAMENT (PCL) REPAIR;  Surgeon: Cammy Copa, MD;  Location: [facility] OR;  Service: Orthopedics;  Laterality: Right;  ? COSMETIC SURGERY    ? HARDWARE REMOVAL Right 09/28/2021  ? Procedure: HARDWARE REMOVAL;  Surgeon: Cammy Copa, MD;  Location: [facility] OR;  Service: Orthopedics;  Laterality: Right;  ? MEDIAL COLLATERAL LIGAMENT REPAIR, KNEE Right 08/31/2021  ? Procedure: RIGHT KNEE MEDIAL COLLATERAL LIGAMENT REPAIR/RECONSTRUCTION (ALL OPEN);  Surgeon: Cammy Copa, MD;  Location: [facility] OR;  Service: Orthopedics;  Laterality: Right;  ? ORIF RADIAL FRACTURE Right 08/28/2021  ? Procedure: OPEN REDUCTION INTERNAL FIXATION (ORIF) RADIAL FRACTURE;  Surgeon: Myrene Galas, MD;  Location: [facility] OR;  Service: Orthopedics;  Laterality: Right;  ? SACRO-ILIAC PINNING Right 08/28/2021  ? Procedure: SACRO-ILIAC SCREW FIXATION WITH  ANTERIOR PELVIC REPAIR;  Surgeon: Myrene Galas, MD;  Location: [facility] OR;  Service: Orthopedics;  Laterality: Right;  ? ?Patient Active Problem List  ? Diagnosis Date Noted  ? Rupture of anterior cruciate ligament of right knee   ? Rupture of posterior cruciate ligament of right knee   ? Complex tear of lateral meniscus of left knee as current injury   ? Injury of  posterolateral corner of knee, left, subsequent encounter   ? Tear of LCL (lateral collateral ligament) of knee, left, subsequent encounter   ? Retained orthopedic hardware   ? Hyperglycemia 10/02/2021  ? Hypoalbuminemia 10/02/2021  ? Elevated alkaline phosphatase level 10/02/2021  ? S/P ACL reconstruction 09/28/2021  ? Right knee dislocation, subsequent encounter   ? TBI (traumatic brain injury) 09/07/2021  ? Multiple trauma 09/07/2021  ? Pelvic fracture (HCC) 08/26/2021  ? ? ?REFERRING PROVIDER: Cammy Copa, MD ? ?REFERRING DIAG: Right knee dislocation, Status post left knee surgery ? ?THERAPY DIAG:  ?Chronic pain of right knee ? ?Chronic pain of left knee ? ?Muscle weakness (generalized) ? ?PERTINENT HISTORY: ?Right knee open MCL reconstruction on 08/28/2021, ACL/PCL reconstruction on 09/28/2020 ?Left knee LCL and lateral corner reconstruction on 09/28/2020 ?Unstable pelvic ring fracture s/p ORIF and SI screws (L to R) on 08/28/2021 ?Right galeazzi fracture dislocation (radius) s/p ORIF 08/28/2021 ? ?PRECAUTIONS: Per surgeon ROM as tolerated, no open chain exercises; WBAT ? ?SUBJECTIVE: Patient reports no pain and consistency with HEP.  ? ?PAIN:  ?Are you having pain? No ?NPRS scale: 0/10 (increase in pain with activity) ?Pain location: Knee ?Pain orientation: Bilateral ?PAIN TYPE: Surgical  ?Pain description: Intermittent, twinge ?Aggravating factors: Keeping knees static (bent or straight) ?Relieving factors: Medication ? ?PATIENT GOALS: Get back to walking normally, start cycling again ? ? ?OBJECTIVE:  ?MUSCLE LENGTH: ?Slight limitation of  bilateral calf and hamstring, quad flexibility not assessed on evaluation ?  ?LE AROM: ?  ?AROM Right ?10/31/2021 Left ?10/31/2021 Right ?11/27/2021 Left ?11/27/2021  ?Knee flexion 105 120 118 133  ?Knee extension 0 0 0 0  ?  ?LE MMT: ?Patient demonstrates SLR without extension lag bilaterally - 11/16/2021 ?  ?FUNCTIONAL TESTS:  ?Patient able to perform sit to stand from  standard chair with hands on thighs - 11/16/2021 ? ?6MWT: 815 using SPC on left side - 11/16/2021 (365 using RW 11/02/2021) ?  ?GAIT: - assessed 11/16/2021 ?Distance walked: 815 ?Assistive device utilized: SPC ?Level of assistance: Modified I ?Comments: patient demonstrates appropriate heel toe progression, hesitant with turns and continues with decreased gait speed ?  ? ?TODAY'S TREATMENT: ?11/29/2021: ? Therapeutic Exercise: ?NuStep L7 x 4 min with LE while taking subjective ?Rec bike L2 x 5 min  ?Leg press (cybex) 80# 3 x 10 ?Standing hip abduction machine 25# 2 x 10 each ?Forward runner step-up 10" box with bilateral UE support 1 x 10 each ?Prone quad stretch with strap x 60 sec each  ?Neur Re-ed: ?SLS on Airex 1 x 30 sec each ?SLS with ABC 1 x each -needs toe touch when SLS on left  ? ?11/29/2021: ? Therapeutic Exercise: ?NuStep L7 x 5 min with LE while taking subjective ?Leg press (cybex) 80# 3 x 10 ?Standing hip abduction machine 25# 2 x 10 each ?Forward runner step-up 8" box with contralateral UE support 2 x 10 each ?Resisted retro walking with FM 13# x 5 ?Resisted lateral walking with FM 13# x 5 each ?Neur Re-ed: ?SLS on Airex 3 x 30 sec each ?SLS with forward cone tap 2 x 5 each - cone placed on 6" box ? ? ?11/27/2021: ? Therapeutic Exercise: ?NuStep L6 x 5 min with LE while taking subjective ?Leg press (BATCA) 65# 3 x 15 ?Standing hip abduction and extension with red band at knee 2 x 10 each - stance leg on Airex with BUE fingertip support ?Forward heel tap on 4" box 2 x 10 each - contralateral UE fingertip support ?Resisted retro walking with FM 10# x 5 ?Resisted lateral walking with FM 10# x 3 each ?Neur Re-ed: ?Tandem stance on Airex 3 x 30 sec each ?Rockerboard sagittal and frontal plane taps x 2 min each ?SLS 3 x 30 sec each ? ?11/22/2021: ? Therapeutic Exercise: ?NuStep L6 x 5 min with LE while taking subjective ?Leg press (BATCA) 55# 3 x 15 ?Heel raise 2 x 20 ?Standing hip abduction and extension with red band  at knee 2 x 15 each - stance leg on Airex with BUE fingertip support ?Forward step-up on 6" box 3 x 10 each - contralateral UE fingertip support ?Wall squat 2 x 10 ?Neur Re-ed: ?Tandem stance on Airex 3 x 30 sec each ?Rockerboard sagittal and frontal plane taps 2 x 1 min each ?SLS 3 x 20 sec each - occasional touch down for balance correction ?  ?PATIENT EDUCATION:  ?Education details: HEP ?Person educated: Patient ?Education method: Explanation, Demonstration, Tactile cues, Verbal cues ?Education comprehension: verbalized understanding, returned demonstration, verbal cues required, tactile cues required, and needs further education ?  ?HOME EXERCISE PROGRAM: ?Access Code: [email or web address] ?  ?  ?ASSESSMENT: ?CLINICAL IMPRESSION: ?Patient tolerated therapy well with no adverse effects. Therapy continues to focus on progression of LE strengthening and stability/balance training. Began recumbent bike to work toward patient personal goal of return to cycling for recreation/ transportation. Worked on dynamic single leg balance  with more difficulty stabilizing on LLE.  He reported min lateral left knee pain while on bike.  Patient would benefit from continued skilled PT to progress his mobility and strength to return to walking and prior activity level without limitation. ? ?Objective impairments include Abnormal gait, decreased balance, difficulty walking, decreased ROM, decreased strength, increased edema, impaired flexibility, improper body mechanics, and pain.  ?  ?  ?GOALS: ?Goals reviewed with patient? Yes ?  ?SHORT TERM GOALS: ?  ?STG Name Target Date Goal status  ?1 Patient will be I with initial HEP in order to progress with therapy. ?Baseline: HEP provided at eval ?11/02/2021: Pt reports daily adherence to his HEP 11/28/2021 ACHIEVED  ?2 Patient will demonstrate SLR without extension lag to indicate improve quad control/strength ?Baseline: patient exhibits slight extension lag and endurance deficit with SLR ?11/16/2021:  able to perform SLR without extension lag 11/28/2021 ACHIEVED  ?3 Patient will achieve >/= 120 deg knee flexion AROM bilaterally to improve transfer ability ?Baseline: 105 deg right knee flexion ?11/16/2021:

## 2021-12-08 ENCOUNTER — Ambulatory Visit (INDEPENDENT_AMBULATORY_CARE_PROVIDER_SITE_OTHER): Payer: Medicaid Other | Admitting: Orthopedic Surgery

## 2021-12-08 ENCOUNTER — Encounter: Payer: Self-pay | Admitting: Orthopedic Surgery

## 2021-12-08 DIAGNOSIS — S83104D Unspecified dislocation of right knee, subsequent encounter: Secondary | ICD-10-CM

## 2021-12-08 NOTE — Progress Notes (Signed)
? ?Post-Op Visit Note ?  ?Patient: Devin Jones           ?Date of Birth: 1966/02/15           ?MRN: [id number] ?Visit Date: 12/08/2021 ?PCP: Claiborne Rigg, NP ? ? ?Assessment & Plan: ? ?Chief Complaint:  ?Chief Complaint  ?Patient presents with  ? Other  ?  Follow up ?s/p right knee open MCL repair in December 2022 with subsequent right knee ACL and PCL reconstruction and left knee LCL/PLC reconstruction on 09/28/2021  ? ?Visit Diagnoses:  ?1. Right knee dislocation, subsequent encounter   ? ? ?Plan: Devin Jones is a 56 year old patient underwent ACL PCL reconstruction on his right knee 09/28/2021 and LCL posterior lateral corner reconstruction left knee 09/28/2021.  He has been weightbearing as tolerated.  Doing physical therapy.  On examination he has excellent range of motion of both knees.  On the left knee his posterolateral corner reconstruction feels very tight LCL reconstruction also feels good to varus stress at 0 and 30 degrees.  No effusion in the left knee.  On the right-hand side his ACL feels good.  PCL has 2+ laxity.  MCL feels good with about 1 mm opening at 0 degrees to valgus stress and 3 mm of opening at 30 degrees to valgus stress with good endpoint.  Plan at this time is to continue with quad strengthening primarily on the right-hand side.  2 months return for final check. ? ?Follow-Up Instructions: No follow-ups on file.  ? ?Orders:  ?No orders of the defined types were placed in this encounter. ? ?No orders of the defined types were placed in this encounter. ? ? ?Imaging: ?No results found. ? ?PMFS History: ?Patient Active Problem List  ? Diagnosis Date Noted  ? Rupture of anterior cruciate ligament of right knee   ? Rupture of posterior cruciate ligament of right knee   ? Complex tear of lateral meniscus of left knee as current injury   ? Injury of posterolateral corner of knee, left, subsequent encounter   ? Tear of LCL (lateral collateral ligament) of knee, left, subsequent encounter   ?  Retained orthopedic hardware   ? Hyperglycemia 10/02/2021  ? Hypoalbuminemia 10/02/2021  ? Elevated alkaline phosphatase level 10/02/2021  ? S/P ACL reconstruction 09/28/2021  ? Right knee dislocation, subsequent encounter   ? TBI (traumatic brain injury) 09/07/2021  ? Multiple trauma 09/07/2021  ? Pelvic fracture (HCC) 08/26/2021  ? ?Past Medical History:  ?Diagnosis Date  ? Alcohol abuse   ?  ?Family History  ?Problem Relation Age of Onset  ? Kidney disease Mother   ? Cancer Father   ? Hypertension Father   ? Heart disease Father   ? Liver cancer Father   ? Cancer - Other Father   ? Stroke Brother   ?  ?Past Surgical History:  ?Procedure Laterality Date  ? ANTERIOR CRUCIATE LIGAMENT REPAIR Right 09/28/2021  ? Procedure: ANTERIOR CRUCIATE LIGAMENT (ACL) & POSTERIOR CRUCIATE LIGAMENT (PCL) REPAIR;  Surgeon: Cammy Copa, MD;  Location: [facility] OR;  Service: Orthopedics;  Laterality: Right;  ? COSMETIC SURGERY    ? HARDWARE REMOVAL Right 09/28/2021  ? Procedure: HARDWARE REMOVAL;  Surgeon: Cammy Copa, MD;  Location: [facility] OR;  Service: Orthopedics;  Laterality: Right;  ? MEDIAL COLLATERAL LIGAMENT REPAIR, KNEE Right 08/31/2021  ? Procedure: RIGHT KNEE MEDIAL COLLATERAL LIGAMENT REPAIR/RECONSTRUCTION (ALL OPEN);  Surgeon: Cammy Copa, MD;  Location: [facility] OR;  Service: Orthopedics;  Laterality: Right;  ?  ORIF RADIAL FRACTURE Right 08/28/2021  ? Procedure: OPEN REDUCTION INTERNAL FIXATION (ORIF) RADIAL FRACTURE;  Surgeon: Myrene Galas, MD;  Location: [facility] OR;  Service: Orthopedics;  Laterality: Right;  ? SACRO-ILIAC PINNING Right 08/28/2021  ? Procedure: SACRO-ILIAC SCREW FIXATION WITH  ANTERIOR PELVIC REPAIR;  Surgeon: Myrene Galas, MD;  Location: [facility] OR;  Service: Orthopedics;  Laterality: Right;  ? ?Social History  ? ?Occupational History  ? Occupation: Production designer, theatre/television/film  ?  Employer: [employer]  ?Tobacco Use  ? Smoking status: Never  ? Smokeless tobacco: Never  ?Substance and Sexual Activity  ? Alcohol use:  Yes  ?  Alcohol/week: 19.0 standard drinks  ?  Types: 16 Cans of beer, 3 Shots of liquor per week  ? Drug use: Never  ? Sexual activity: Not on file  ? ? ? ?
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
## 2021-12-13 ENCOUNTER — Encounter: Payer: Self-pay | Admitting: Physical Therapy

## 2021-12-13 ENCOUNTER — Ambulatory Visit: Payer: Medicaid Other | Admitting: Physical Therapy

## 2021-12-13 ENCOUNTER — Other Ambulatory Visit: Payer: Self-pay

## 2021-12-13 DIAGNOSIS — G8929 Other chronic pain: Secondary | ICD-10-CM

## 2021-12-13 DIAGNOSIS — R2689 Other abnormalities of gait and mobility: Secondary | ICD-10-CM

## 2021-12-13 DIAGNOSIS — M25561 Pain in right knee: Secondary | ICD-10-CM | POA: Diagnosis not present

## 2021-12-13 DIAGNOSIS — M6281 Muscle weakness (generalized): Secondary | ICD-10-CM

## 2021-12-13 NOTE — Patient Instructions (Signed)
Access Code: [email or web address] ?URL: [email or web address] ?Date: 12/13/2021 ?Prepared by: Rosana Hoes ? ?Exercises ?- Supine Quadriceps Stretch with Strap on Table  - 2-3 x daily - 7 x weekly - 3 reps - 30-60 seconds hold ?- Prone Quadriceps Stretch with Strap  - 2-3 x daily - 7 x weekly - 3 reps - 30-60 seconds hold ?- Supine Straight Leg Raise with Elbow Support  - 2-3 x daily - 7 x weekly - 2 sets - 10 reps ?- Long Sitting Calf Stretch with Strap  - 2-3 x daily - 7 x weekly - 3 reps - 30 seconds hold ?- Hooklying Hamstring Stretch with Strap  - 2-3 x daily - 7 x weekly - 3 reps - 30 seconds hold ?- Sitting Heel Slide with Towel  - 2-3 x daily - 7 x weekly - 10 reps - 5-10 seconds hold ?- Long Sitting 4 Way Patellar Glide  - 2 x daily - 7 x weekly - 20 reps ?- Sidelying Hip Abduction  - 2-3 x daily - 7 x weekly - 2 sets - 10 reps ?- Wall Squat  - 1-2 x daily - 7 x weekly - 3 sets - 10 reps ?- Split Squats Upright Trunk (Quad Bias)  - 1 x daily - 2 sets - 10 reps ?- Side Stepping with Resistance at Thighs  - 1 x daily - 3 sets - 20 reps ?
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
## 2021-12-20 ENCOUNTER — Encounter: Payer: Self-pay | Admitting: Physical Medicine & Rehabilitation

## 2021-12-20 ENCOUNTER — Encounter: Payer: Self-pay | Admitting: Physical Therapy

## 2021-12-20 ENCOUNTER — Other Ambulatory Visit: Payer: Self-pay

## 2021-12-20 ENCOUNTER — Encounter: Payer: Medicaid Other | Attending: Physical Medicine & Rehabilitation | Admitting: Physical Medicine & Rehabilitation

## 2021-12-20 ENCOUNTER — Ambulatory Visit: Payer: Medicaid Other | Attending: Orthopedic Surgery | Admitting: Physical Therapy

## 2021-12-20 VITALS — BP 118/82 | HR 98 | Ht 71.0 in | Wt 165.8 lb

## 2021-12-20 DIAGNOSIS — T07XXXA Unspecified multiple injuries, initial encounter: Secondary | ICD-10-CM

## 2021-12-20 DIAGNOSIS — M6281 Muscle weakness (generalized): Secondary | ICD-10-CM | POA: Diagnosis present

## 2021-12-20 DIAGNOSIS — G8929 Other chronic pain: Secondary | ICD-10-CM | POA: Insufficient documentation

## 2021-12-20 DIAGNOSIS — M25561 Pain in right knee: Secondary | ICD-10-CM | POA: Diagnosis present

## 2021-12-20 DIAGNOSIS — S329XXS Fracture of unspecified parts of lumbosacral spine and pelvis, sequela: Secondary | ICD-10-CM

## 2021-12-20 DIAGNOSIS — M25562 Pain in left knee: Secondary | ICD-10-CM | POA: Diagnosis present

## 2021-12-20 DIAGNOSIS — R2689 Other abnormalities of gait and mobility: Secondary | ICD-10-CM | POA: Insufficient documentation
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
## 2021-12-20 NOTE — Patient Instructions (Signed)
PLEASE FEEL FREE TO CALL OUR OFFICE WITH ANY PROBLEMS OR QUESTIONS ([phone])      
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
## 2021-12-26 ENCOUNTER — Other Ambulatory Visit: Payer: Self-pay

## 2021-12-26 ENCOUNTER — Ambulatory Visit: Payer: Medicaid Other | Admitting: Physical Therapy

## 2021-12-26 ENCOUNTER — Encounter: Payer: Self-pay | Admitting: Physical Therapy

## 2021-12-26 DIAGNOSIS — G8929 Other chronic pain: Secondary | ICD-10-CM

## 2021-12-26 DIAGNOSIS — M25561 Pain in right knee: Secondary | ICD-10-CM | POA: Diagnosis not present

## 2021-12-26 DIAGNOSIS — R2689 Other abnormalities of gait and mobility: Secondary | ICD-10-CM

## 2021-12-26 DIAGNOSIS — M6281 Muscle weakness (generalized): Secondary | ICD-10-CM

## 2021-12-27 ENCOUNTER — Ambulatory Visit: Payer: MEDICAID | Attending: Nurse Practitioner | Admitting: Nurse Practitioner

## 2021-12-27 ENCOUNTER — Encounter: Payer: Self-pay | Admitting: Nurse Practitioner

## 2021-12-27 VITALS — BP 130/87 | HR 58 | Wt 165.8 lb

## 2021-12-27 DIAGNOSIS — D649 Anemia, unspecified: Secondary | ICD-10-CM | POA: Diagnosis not present

## 2021-12-27 DIAGNOSIS — R7309 Other abnormal glucose: Secondary | ICD-10-CM | POA: Diagnosis not present

## 2021-12-27 DIAGNOSIS — Z1211 Encounter for screening for malignant neoplasm of colon: Secondary | ICD-10-CM

## 2021-12-27 DIAGNOSIS — Z0000 Encounter for general adult medical examination without abnormal findings: Secondary | ICD-10-CM | POA: Diagnosis not present

## 2021-12-27 DIAGNOSIS — E785 Hyperlipidemia, unspecified: Secondary | ICD-10-CM
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
## 2021-12-28 LAB — CMP14+EGFR
ALT: 13 IU/L (ref 0–44)
AST: 16 IU/L (ref 0–40)
Albumin/Globulin Ratio: 1.9 (ref 1.2–2.2)
Albumin: 4.9 g/dL (ref 3.8–4.9)
Alkaline Phosphatase: 189 IU/L — ABNORMAL HIGH (ref 44–121)
BUN/Creatinine Ratio: 17 (ref 9–20)
BUN: 16 mg/dL (ref 6–24)
Bilirubin Total: 0.4 mg/dL (ref 0.0–1.2)
CO2: 25 mmol/L (ref 20–29)
Calcium: 10.4 mg/dL — ABNORMAL HIGH (ref 8.7–10.2)
Chloride: 101 mmol/L (ref 96–106)
Creatinine, Ser: 0.95 mg/dL (ref 0.76–1.27)
Globulin, Total: 2.6 g/dL (ref 1.5–4.5)
Glucose: 94 mg/dL (ref 70–99)
Potassium: 5.7 mmol/L — ABNORMAL HIGH (ref 3.5–5.2)
Sodium: 140 mmol/L (ref 134–144)
Total Protein: 7.5 g/dL (ref 6.0–8.5)
eGFR: 94 mL/min/{1.73_m2} (ref >59)

## 2021-12-28 LAB — LIPID PANEL
Chol/HDL Ratio: 4.6 ratio (ref 0.0–5.0)
Cholesterol, Total: 275 mg/dL — ABNORMAL HIGH (ref 100–199)
HDL: 60 mg/dL (ref >39)
LDL Chol Calc (NIH): 183 mg/dL — ABNORMAL HIGH (ref 0–99)
Triglycerides: 174 mg/dL — ABNORMAL HIGH (ref 0–149)
VLDL Cholesterol Cal: 32 mg/dL (ref 5–40)

## 2021-12-28 LAB — CBC
Hematocrit: 49.1 % (ref 37.5–51.0)
Hemoglobin: 17.1 g/dL (ref 13.0–17.7)
MCH: 29.8 pg (ref 26.6–33.0)
MCHC: 34.8 g/dL (ref 31.5–35.7)
MCV: 86 fL (ref 79–97)
Platelets: 239 10*3/uL (ref 150–450)
RBC: 5.74 x10E6/uL (ref 4.14–5.80)
RDW: 14.2 % (ref 11.6–15.4)
WBC: 5.7 10*3/uL (ref 3.4–10.8)

## 2021-12-28 LAB — HEMOGLOBIN A1C
Est. average glucose Bld gHb Est-mCnc: 120 mg/dL
Hgb A1c MFr Bld: 5.8 % — ABNORMAL HIGH (ref 4.8–5.6)

## 2022-01-01 ENCOUNTER — Other Ambulatory Visit: Payer: Self-pay | Admitting: Nurse Practitioner

## 2022-01-01 DIAGNOSIS — R7401 Elevation of levels of liver transaminase levels: Secondary | ICD-10-CM

## 2022-01-01 DIAGNOSIS — E876 Hypokalemia: Secondary | ICD-10-CM
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
## 2022-01-02 ENCOUNTER — Encounter: Payer: Self-pay | Admitting: Physical Therapy

## 2022-01-02 ENCOUNTER — Ambulatory Visit: Payer: Medicaid Other | Admitting: Physical Therapy

## 2022-01-02 ENCOUNTER — Other Ambulatory Visit: Payer: Self-pay

## 2022-01-02 DIAGNOSIS — G8929 Other chronic pain: Secondary | ICD-10-CM

## 2022-01-02 DIAGNOSIS — M6281 Muscle weakness (generalized): Secondary | ICD-10-CM

## 2022-01-02 DIAGNOSIS — R2689 Other abnormalities of gait and mobility: Secondary | ICD-10-CM

## 2022-01-02 DIAGNOSIS — M25561 Pain in right knee: Secondary | ICD-10-CM | POA: Diagnosis not present
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
## 2022-01-09 ENCOUNTER — Encounter: Payer: Self-pay | Admitting: Physical Therapy

## 2022-01-09 ENCOUNTER — Ambulatory Visit: Payer: Medicaid Other | Admitting: Physical Therapy

## 2022-01-09 ENCOUNTER — Other Ambulatory Visit: Payer: Self-pay

## 2022-01-09 DIAGNOSIS — M6281 Muscle weakness (generalized): Secondary | ICD-10-CM

## 2022-01-09 DIAGNOSIS — M25561 Pain in right knee: Secondary | ICD-10-CM | POA: Diagnosis not present

## 2022-01-09 DIAGNOSIS — R2689 Other abnormalities of gait and mobility: Secondary | ICD-10-CM

## 2022-01-09 DIAGNOSIS — G8929 Other chronic pain: Secondary | ICD-10-CM
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
## 2022-01-16 ENCOUNTER — Ambulatory Visit: Payer: Medicaid Other | Attending: Orthopedic Surgery | Admitting: Physical Therapy

## 2022-01-16 ENCOUNTER — Encounter: Payer: Self-pay | Admitting: Physical Therapy

## 2022-01-16 ENCOUNTER — Other Ambulatory Visit: Payer: Self-pay

## 2022-01-16 DIAGNOSIS — M6281 Muscle weakness (generalized): Secondary | ICD-10-CM

## 2022-01-16 DIAGNOSIS — M25561 Pain in right knee: Secondary | ICD-10-CM | POA: Diagnosis present

## 2022-01-16 DIAGNOSIS — M25562 Pain in left knee: Secondary | ICD-10-CM | POA: Diagnosis present

## 2022-01-16 DIAGNOSIS — G8929 Other chronic pain: Secondary | ICD-10-CM | POA: Diagnosis present

## 2022-01-16 DIAGNOSIS — R2689 Other abnormalities of gait and mobility: Secondary | ICD-10-CM | POA: Diagnosis present
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
## 2022-01-23 ENCOUNTER — Encounter: Payer: Self-pay | Admitting: Physical Therapy

## 2022-01-23 ENCOUNTER — Other Ambulatory Visit: Payer: Self-pay

## 2022-01-23 ENCOUNTER — Ambulatory Visit: Payer: Medicaid Other | Admitting: Physical Therapy

## 2022-01-23 DIAGNOSIS — M25561 Pain in right knee: Secondary | ICD-10-CM | POA: Diagnosis not present

## 2022-01-23 DIAGNOSIS — G8929 Other chronic pain: Secondary | ICD-10-CM

## 2022-01-23 DIAGNOSIS — R2689 Other abnormalities of gait and mobility: Secondary | ICD-10-CM

## 2022-01-23 DIAGNOSIS — M6281 Muscle weakness (generalized): Secondary | ICD-10-CM

## 2022-01-29 NOTE — Therapy (Signed)
?OUTPATIENT PHYSICAL THERAPY TREATMENT NOTE ? ? ?Patient Name: Devin Jones ?MRN: [id number] ?DOB:1966/05/23, 56 y.o., male ?Today's Date: 01/30/2022 ? ?PCP: Claiborne Rigg, NP ?REFERRING PROVIDER: Cammy Copa, MD ? ? ? ? PT End of Session - 01/30/22 1450   ? ? Visit Number 19   ? Number of Visits 25   ? Date for PT Re-Evaluation 03/13/22   ? Authorization Type [employer]   ? Authorization - Number of Visits 27   ? PT Start Time 1445   ? PT Stop Time 1530   ? PT Time Calculation (min) 45 min   ? Activity Tolerance Patient tolerated treatment well   ? Behavior During Therapy [facility] for tasks assessed/performed   ? ?  ?  ? ?  ? ? ? ? ? ? ? ? ? ? ? ? ? ?Past Medical History:  ?Diagnosis Date  ? Alcohol abuse   ? ?Past Surgical History:  ?Procedure Laterality Date  ? ANTERIOR CRUCIATE LIGAMENT REPAIR Right 09/28/2021  ? Procedure: ANTERIOR CRUCIATE LIGAMENT (ACL) & POSTERIOR CRUCIATE LIGAMENT (PCL) REPAIR;  Surgeon: Cammy Copa, MD;  Location: [facility];  Service: Orthopedics;  Laterality: Right;  ? COSMETIC SURGERY    ? HARDWARE REMOVAL Right 09/28/2021  ? Procedure: HARDWARE REMOVAL;  Surgeon: Cammy Copa, MD;  Location: [facility];  Service: Orthopedics;  Laterality: Right;  ? MEDIAL COLLATERAL LIGAMENT REPAIR, KNEE Right 08/31/2021  ? Procedure: RIGHT KNEE MEDIAL COLLATERAL LIGAMENT REPAIR/RECONSTRUCTION (ALL OPEN);  Surgeon: Cammy Copa, MD;  Location: [facility];  Service: Orthopedics;  Laterality: Right;  ? ORIF RADIAL FRACTURE Right 08/28/2021  ? Procedure: OPEN REDUCTION INTERNAL FIXATION (ORIF) RADIAL FRACTURE;  Surgeon: Myrene Galas, MD;  Location: [facility];  Service: Orthopedics;  Laterality: Right;  ? SACRO-ILIAC PINNING Right 08/28/2021  ? Procedure: SACRO-ILIAC SCREW FIXATION WITH  ANTERIOR PELVIC REPAIR;  Surgeon: Myrene Galas, MD;  Location: [facility];  Service: Orthopedics;  Laterality: Right;  ? ?Patient Active Problem List  ? Diagnosis Date Noted  ? Rupture of anterior cruciate ligament  of right knee   ? Rupture of posterior cruciate ligament of right knee   ? Complex tear of lateral meniscus of left knee as current injury   ? Injury of posterolateral corner of knee, left, subsequent encounter   ? Tear of LCL (lateral collateral ligament) of knee, left, subsequent encounter   ? Retained orthopedic hardware   ? Hyperglycemia 10/02/2021  ? Hypoalbuminemia 10/02/2021  ? Elevated alkaline phosphatase level 10/02/2021  ? S/P ACL reconstruction 09/28/2021  ? Right knee dislocation, subsequent encounter   ? TBI (traumatic brain injury) ([facility]) 09/07/2021  ? Multiple trauma 09/07/2021  ? Pelvic fracture ([facility]) 08/26/2021  ? ? ?REFERRING PROVIDER: Cammy Copa, MD ? ?REFERRING DIAG: Right knee dislocation, Status post left knee surgery ? ?THERAPY DIAG:  ?Chronic pain of right knee ? ?Chronic pain of left knee ? ?Muscle weakness (generalized) ? ?Other abnormalities of gait and mobility ? ?PERTINENT HISTORY: ?Right knee open MCL reconstruction on 08/28/2021, ACL/PCL reconstruction on 09/28/2020 ?Left knee LCL and lateral corner reconstruction on 09/28/2020 ?Unstable pelvic ring fracture s/p ORIF and SI screws (L to R) on 08/28/2021 ?Right galeazzi fracture dislocation (radius) s/p ORIF 08/28/2021 ? ?PRECAUTIONS: None ? ?SUBJECTIVE: Patient reports he continues to do well. He is still working on his strengthening at home and going on daily walks with no issues. He feels he continues to improve. ? ?PAIN:  ?Are you having pain? No ?NPRS  scale: 0/10 (increase in pain with activity) ?Pain location: Knee ?Pain orientation: Bilateral ?PAIN TYPE: Surgical  ?Pain description: Intermittent, twinge ?Aggravating factors: Keeping knees static (bent or straight) ?Relieving factors: Medication ? ?PATIENT GOALS: Get back to walking normally, start cycling again ? ? ?OBJECTIVE:  ?MUSCLE LENGTH: ?Slight limitation of bilateral calf and hamstring, quad flexibility not assessed on evaluation ?  ?LE AROM: ?  ?AROM  Right ?11/27/2021 Left ?11/27/2021 Right ?12/20/2021 Left ?12/20/2021 Rt / Lt ?12/26/2021  ?Knee flexion 118 133 127 135 130 / 135  ?Knee extension 0 0   0 / 0  ?  ?LE MMT: ? Right ?12/26/2021 Left ?12/26/2021 Rt / Lt ?01/16/2022 Rt / Lt ?01/23/2022 Rt / Lt ?01/30/2022  ?Knee flexion 4 4 4+ / 4+ 4+ / 4+   ?Knee extension 4 4 4+ / 4+ 4+ / 4+   ? ?  ?FUNCTIONAL TESTS:  ?Patient able to perform sit to stand from standard chair with hands on thighs - 11/16/2021 ? ?6MWT: 1590 without AD - 01/30/2022 (1365 without AD - 01/09/2022; 815 using SPC on left side - 11/16/2021; 365 using RW 11/02/2021) ? Age matched norm: 1830 ft ?  ?GAIT:  ?Distance walked: 1590 - 01/30/2022 ?Assistive device utilized: None ?Level of assistance: Independent ?Comments: grossly WFL ?  ? ?TODAY'S TREATMENT: ?01/30/2022: ? Therapeutic Exercise: ?Elliptical L5 R1 x 5 min while taking subjective ?6MWT - workload capacity training ?Leg press (cybex)  ?DL:140# 3 x 10 ?SL: 50# 2 x 10 each ?Knee extension machine 50# 3 x 8 ?Knee flexion machine 50# 3 x 8 ?SLS on Airex 3 x 30 sec each ?Forward step-up on 10" box 2 x 10 each ?SL heel raises 2 x 20 each ? ? ?01/23/2022: ? Therapeutic Exercise: ?Elliptical L5 R1 x 5 min while taking subjective ?Leg press (cybex) DL:140# 3 x 10 ?[place] split squat with 25# ipsilateral and contralateral UE support for balance 3 x 8 each ?Modified side plank and clamshell 3 x 10 each ?Forward step-up on 10" box 2 x 10 each ?SL heel raises 2 x 20 each ?Knee extension machine 35# 3 x 10 ?Knee flexion machine 35# 3 x 10 ? ?01/16/2022: ? Therapeutic Exercise: ?Elliptical L5 R1 x 5 min while taking subjective ?6MWT for workload capacity training ?Leg press (cybex)  ?DL:140# 3 x 10 ?SL: 50# 2 x 8 each ?Hex bar deadlift 95# 3 x 8 ?SL heel raises 2 x 20 each ?Hip abduction machine 37.5# 2 x 10 each ?SLS on Airex 3 x 30 sec each ?Forward step-up 10" 2 x 10 each ?  ?PATIENT EDUCATION:  ?Education details: POC update, HEP ?Person educated: Patient ?Education  method: Explanation, Demonstration, Tactile cues, Verbal cues ?Education comprehension: verbalized understanding, returned demonstration, verbal cues required, tactile cues required, and needs further education ?  ?HOME EXERCISE PROGRAM: ?Access Code: [email or web address] ?  ?  ?ASSESSMENT: ?CLINICAL IMPRESSION: ?Patient tolerated therapy well with no adverse effects. He is continuing to progress well in therapy and demonstrating continued improvement in his strength and functional ability. He does continue to exhibit overall limitation in his strength and deficits with walking ability compared to age matched norm. He is progressing with his exercise program for strengthening and is gradually increasing his walking ability with distance. Therapy continues to focus primarily on progressive strengthening and stability with good tolerance. Patient would benefit from continued skilled PT to progress his mobility and strength to return to walking and prior activity level without limitation, so will extend his  PT POC for 6 more weeks at frequency of 1x/week. ? ?Objective impairments include Abnormal gait, decreased balance, difficulty walking, decreased ROM, decreased strength, increased edema, impaired flexibility, improper body mechanics, and pain.  ?  ?  ?GOALS: ?Goals reviewed with patient? Yes ?  ?SHORT TERM GOALS: ?  ?STG Name Target Date Goal status  ?1 Patient will be I with initial HEP in order to progress with therapy. ?Baseline: HEP provided at eval ?11/02/2021: Pt reports daily adherence to his HEP 11/28/2021 ACHIEVED  ?2 Patient will demonstrate SLR without extension lag to indicate improve quad control/strength ?Baseline: patient exhibits slight extension lag and endurance deficit with SLR ?11/16/2021: able to perform SLR without extension lag 11/28/2021 ACHIEVED  ?3 Patient will achieve >/= 120 deg knee flexion AROM bilaterally to improve transfer ability ?Baseline: 105 deg right knee flexion ?11/16/2021: left 120 deg, right  118 deg ?11/27/2021: left 133 deg, right 118 deg 11/28/2021 ACHIEVED  ?4 Patient will be able to ambulate household distances using LRAD in order to improve independence and ability to perform self care tasks ?Baselin

## 2022-01-30 ENCOUNTER — Other Ambulatory Visit: Payer: Self-pay

## 2022-01-30 ENCOUNTER — Ambulatory Visit: Payer: Medicaid Other | Admitting: Physical Therapy

## 2022-01-30 ENCOUNTER — Encounter: Payer: Self-pay | Admitting: Physical Therapy

## 2022-01-30 DIAGNOSIS — M6281 Muscle weakness (generalized): Secondary | ICD-10-CM

## 2022-01-30 DIAGNOSIS — R2689 Other abnormalities of gait and mobility: Secondary | ICD-10-CM

## 2022-01-30 DIAGNOSIS — G8929 Other chronic pain: Secondary | ICD-10-CM

## 2022-01-30 DIAGNOSIS — M25561 Pain in right knee: Secondary | ICD-10-CM | POA: Diagnosis not present

## 2022-02-05 ENCOUNTER — Encounter: Payer: Self-pay | Admitting: Physical Therapy

## 2022-02-05 ENCOUNTER — Ambulatory Visit: Payer: Medicaid Other | Admitting: Physical Therapy

## 2022-02-05 ENCOUNTER — Other Ambulatory Visit: Payer: Self-pay

## 2022-02-05 DIAGNOSIS — M6281 Muscle weakness (generalized): Secondary | ICD-10-CM

## 2022-02-05 DIAGNOSIS — G8929 Other chronic pain: Secondary | ICD-10-CM

## 2022-02-05 DIAGNOSIS — R2689 Other abnormalities of gait and mobility: Secondary | ICD-10-CM

## 2022-02-05 DIAGNOSIS — M25561 Pain in right knee: Secondary | ICD-10-CM | POA: Diagnosis not present

## 2022-02-05 NOTE — Therapy (Signed)
OUTPATIENT PHYSICAL THERAPY TREATMENT NOTE   Patient Name: Devin Jones MRN: [id number] DOB:08/16/1966, 56 y.o., male Today's Date: 02/05/2022  PCP: Gildardo Pounds, NP REFERRING PROVIDER: Meredith Pel, MD     PT End of Session - 02/05/22 1147     Visit Number 20    Number of Visits 25    Date for PT Re-Evaluation 03/13/22    Authorization Type [employer]    Authorization - Number of Visits 27    PT Start Time 4174    PT Stop Time 1215    PT Time Calculation (min) 30 min    Activity Tolerance Patient tolerated treatment well    Behavior During Therapy [facility] for tasks assessed/performed                         Past Medical History:  Diagnosis Date   Alcohol abuse    Past Surgical History:  Procedure Laterality Date   ANTERIOR CRUCIATE LIGAMENT REPAIR Right 09/28/2021   Procedure: ANTERIOR CRUCIATE LIGAMENT (ACL) & POSTERIOR CRUCIATE LIGAMENT (PCL) REPAIR;  Surgeon: Meredith Pel, MD;  Location: [place];  Service: Orthopedics;  Laterality: Right;   COSMETIC SURGERY     HARDWARE REMOVAL Right 09/28/2021   Procedure: HARDWARE REMOVAL;  Surgeon: Meredith Pel, MD;  Location: [place];  Service: Orthopedics;  Laterality: Right;   MEDIAL COLLATERAL LIGAMENT REPAIR, KNEE Right 08/31/2021   Procedure: RIGHT KNEE MEDIAL COLLATERAL LIGAMENT REPAIR/RECONSTRUCTION (ALL OPEN);  Surgeon: Meredith Pel, MD;  Location: [place];  Service: Orthopedics;  Laterality: Right;   ORIF RADIAL FRACTURE Right 08/28/2021   Procedure: OPEN REDUCTION INTERNAL FIXATION (ORIF) RADIAL FRACTURE;  Surgeon: Altamese Mariano Colon, MD;  Location: [place];  Service: Orthopedics;  Laterality: Right;   SACRO-ILIAC PINNING Right 08/28/2021   Procedure: SACRO-ILIAC SCREW FIXATION WITH  ANTERIOR PELVIC REPAIR;  Surgeon: Altamese Brice Prairie, MD;  Location: [place];  Service: Orthopedics;  Laterality: Right;   Patient Active Problem List   Diagnosis Date Noted   Rupture of anterior cruciate  ligament of right knee    Rupture of posterior cruciate ligament of right knee    Complex tear of lateral meniscus of left knee as current injury    Injury of posterolateral corner of knee, left, subsequent encounter    Tear of LCL (lateral collateral ligament) of knee, left, subsequent encounter    Retained orthopedic hardware    Hyperglycemia 10/02/2021   Hypoalbuminemia 10/02/2021   Elevated alkaline phosphatase level 10/02/2021   S/P ACL reconstruction 09/28/2021   Right knee dislocation, subsequent encounter    TBI (traumatic brain injury) ([place]) 09/07/2021   Multiple trauma 09/07/2021   Pelvic fracture ([place]) 08/26/2021    REFERRING PROVIDER: Meredith Pel, MD  REFERRING DIAG: Right knee dislocation, Status post left knee surgery  THERAPY DIAG:  Chronic pain of right knee  Chronic pain of left knee  Muscle weakness (generalized)  Other abnormalities of gait and mobility  PERTINENT HISTORY: Right knee open MCL reconstruction on 08/28/2021, ACL/PCL reconstruction on 09/28/2020 Left knee LCL and lateral corner reconstruction on 09/28/2020 Unstable pelvic ring fracture s/p ORIF and SI screws (L to R) on 08/28/2021 Right galeazzi fracture dislocation (radius) s/p ORIF 08/28/2021  PRECAUTIONS: None  SUBJECTIVE: Patient reports he is doing a little bit more walking, he is still walking about 1.5 miles and he hasn't been having any fatigue.   PAIN:  Are you having pain? No NPRS scale: 0/10 (increase in  pain with activity) Pain location: Knee Pain orientation: Bilateral PAIN TYPE: Surgical  Pain description: Intermittent, twinge Aggravating factors: Keeping knees static (bent or straight) Relieving factors: Medication  PATIENT GOALS: Get back to walking normally, start cycling again   OBJECTIVE:  MUSCLE LENGTH: Slight limitation of bilateral calf and hamstring, quad flexibility not assessed on evaluation   LE AROM:   AROM Right 11/27/2021 Left 11/27/2021  Right 12/20/2021 Left 12/20/2021 Rt / Lt 12/26/2021  Knee flexion 118 133 127 135 130 / 135  Knee extension 0 0   0 / 0    LE MMT:  Right 12/26/2021 Left 12/26/2021 Rt / Lt 01/16/2022 Rt / Lt 01/23/2022 Rt / Lt 02/05/2022  Knee flexion 4 4 4+ / 4+ 4+ / 4+ 4+ / 4+  Knee extension 4 4 4+ / 4+ 4+ / 4+ 4+ / 4+     FUNCTIONAL TESTS:  Patient able to perform sit to stand from standard chair with hands on thighs - 11/16/2021  6MWT: 1590 without AD - 01/30/2022 (1365 without AD - 01/09/2022; 815 using SPC on left side - 11/16/2021; 365 using RW 11/02/2021)  Age matched norm: 1830 ft   GAIT:  Distance walked: 1590 - 01/30/2022 Assistive device utilized: None Level of assistance: Independent Comments: grossly WFL    TODAY'S TREATMENT: 02/05/2022:  Therapeutic Exercise: Elliptical L5 R5 x 5 min while taking subjective Leg press (cybex)  DL:150# 3 x 10 SL: 50# 2 x 10 each Forward step-up on 10" box holding 5# bilat x 10 each, with 10# bilat x 10 SL heel raises on leg press 50# 2 x 20 each Lateral band walk with green at mid shin 2 x 20 each Knee extension machine 50# 2 x 10  01/30/2022:  Therapeutic Exercise: Elliptical L5 R1 x 5 min while taking subjective 6MWT - workload capacity training Leg press (cybex)  DL:140# 3 x 10 SL: 50# 2 x 10 each Knee extension machine 50# 3 x 8 Knee flexion machine 50# 3 x 8 SLS on Airex 3 x 30 sec each Forward step-up on 10" box 2 x 10 each SL heel raises 2 x 20 each  01/23/2022:  Therapeutic Exercise: Elliptical L5 R1 x 5 min while taking subjective Leg press (cybex) DL:140# 3 x 10 Bulgarian split squat with 25# ipsilateral and contralateral UE support for balance 3 x 8 each Modified side plank and clamshell 3 x 10 each Forward step-up on 10" box 2 x 10 each SL heel raises 2 x 20 each Knee extension machine 35# 3 x 10 Knee flexion machine 35# 3 x 10   PATIENT EDUCATION:  Education details: HEP, continue walking progression Person educated:  Patient Education method: Explanation, Demonstration, Tactile cues, Verbal cues Education comprehension: verbalized understanding, returned demonstration, verbal cues required, tactile cues required, and needs further education   HOME EXERCISE PROGRAM: Access Code: [email or web address]     ASSESSMENT: CLINICAL IMPRESSION: Patient tolerated therapy well with no adverse effects. Therapy limited on time due to patient arriving late. Therapy focused on continued strengthening this visit and he is progressing well with no report of increased pain. He notes improved walking ability and feels he is continuing to get stronger. No changes to HEP this visit. Patient would benefit from continued skilled PT to progress his mobility and strength to return to walking and prior activity level without limitation.  Objective impairments include Abnormal gait, decreased balance, difficulty walking, decreased ROM, decreased strength, increased edema, impaired flexibility, improper body mechanics, and pain.  GOALS: Goals reviewed with patient? Yes   SHORT TERM GOALS:   STG Name Target Date Goal status  1 Patient will be I with initial HEP in order to progress with therapy. Baseline: HEP provided at eval 11/02/2021: Pt reports daily adherence to his HEP 11/28/2021 ACHIEVED  2 Patient will demonstrate SLR without extension lag to indicate improve quad control/strength Baseline: patient exhibits slight extension lag and endurance deficit with SLR 11/16/2021: able to perform SLR without extension lag 11/28/2021 ACHIEVED  3 Patient will achieve >/= 120 deg knee flexion AROM bilaterally to improve transfer ability Baseline: 105 deg right knee flexion 11/16/2021: left 120 deg, right 118 deg 11/27/2021: left 133 deg, right 118 deg 11/28/2021 ACHIEVED  4 Patient will be able to ambulate household distances using LRAD in order to improve independence and ability to perform self care tasks Baseline: patient is using wheel chair for  all mobility 11/02/2021: Pt able to ambulate 365 feet in 6 minutes 11/28/2021 ACHIEVED    LONG TERM GOALS:    LTG Name Target Date Goal status  1 Patient will be I with final HEP to maintain progress from PT. Baseline: HEP provided at eval 12/26/2021: progressing with HEP 03/13/2022 ONGOING  2 Patient will be able to ambulate community level distances with LRAD in order to improve community access and ability to return to work Baseline: patient using wheel chair for all mobility at evaluation 12/26/2021: patient no longer using AD, but reports limitation in walking stamina 01/09/2022: patient demonstrates improvement with 6MWT without AD but still below age matched norms 01/30/2022: 60 ft (age matched norm 1830 ft) 03/13/2022 ONGOING  3 Patient will exhibit bilateral knee AROM grossly WFL and non-painful to allow for ability to return to cycling Baseline: patient demonstrates limitations with knee AROM 12/26/2021: knee AROM grossly [facility] 02/06/2022 MET  4 Patient will demonstrate bilateral knee strength 5/5 MMT in order to improve ability to squat/stoop to perform work related tasks Baseline: not formally assessed at evaluation, he demonstrates gross strength deficit with decreased quad control 12/26/2021: grossly 4/5 MMT bilaterally 01/30/2022: grossly 4+/5 MMT bilaterall 03/13/2022 ONGOING  5 Patient will report </= 2/10 pain with all activity in order to reduce functional limitation and allow for return to prior level of function Baseline: patient reports increased pain with activity 12/26/2021: patient denies knee pain with activity 02/06/2022 MET      PLAN: PT FREQUENCY: 1x/week   PT DURATION: 6 weeks   PLANNED INTERVENTIONS: Therapeutic exercises, Therapeutic activity, Neuro Muscular re-education, Balance training, Gait training, Patient/Family education, Joint mobilization, Aquatic Therapy, Dry Needling, Electrical stimulation, Cryotherapy, Moist heat, scar mobilization, Taping, Vasopneumatic  device, Ionotophoresis 94m/ml Dexamethasone, and Manual therapy   PLAN FOR NEXT SESSION: Review HEP and progress PRN, progress strength and stability   CHilda Blades PT, DPT, LAT, ATC 02/05/22  12:15 PM Phone: [phone]: [phone]

## 2022-02-07 ENCOUNTER — Ambulatory Visit: Payer: Medicaid Other | Admitting: Surgical

## 2022-02-07 ENCOUNTER — Encounter: Payer: Self-pay | Admitting: Orthopedic Surgery

## 2022-02-07 DIAGNOSIS — S83104D Unspecified dislocation of right knee, subsequent encounter: Secondary | ICD-10-CM | POA: Diagnosis not present

## 2022-02-07 DIAGNOSIS — Z9889 Other specified postprocedural states: Secondary | ICD-10-CM

## 2022-02-07 NOTE — Progress Notes (Signed)
Post-Op Visit Note   Patient: Devin Jones           Date of Birth: 04-11-1966           MRN: [id number] Visit Date: 02/07/2022 PCP: Claiborne Rigg, NP   Assessment & Plan:  Chief Complaint:  Chief Complaint  Patient presents with   Right Knee - Pain   Visit Diagnoses:  1. Right knee dislocation, subsequent encounter   2. Status post left knee surgery     Plan: Patient is a 56 year old male who presents s/p right knee ACL/PCL reconstruction with MCL repair/reconstruction 1 month prior to that.  He also had left knee LCL/PLC reconstruction at the same time as his ACL/PCL reconstruction.  He is about 4 to 5 months out from procedure.  Still going to physical therapy 1 time per week.  He is full weightbearing with no instability.  No significant pain.  Does not have to take any medications for pain.  Mostly working on strengthening and physical therapy.  On exam, has 0 degrees extension of both knees with 115 to 120 degrees of knee flexion on the right knee and 130 degrees of knee flexion on the left knee.  No effusion in either knee.  Both knees are stable to anterior and posterior drawer with a little bit of increased posterior laxity in the right knee compared with the left.  He has no significant posterolateral rotational instability of the left knee and LCL is stable in the left knee.  MCL is stable in the right knee.  He is able to perform straight leg raise without extensor lag in both knees.  Ambulates without any significant antalgia or buckling of the knee.  Stable on Lachman exam in the right knee.  Incisions are well-healed.  Plan is continue with physical therapy and home exercise program.  He will follow-up with the office as needed with any concerns.  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  Cautioned him against any high level athletic activities but should be okay to graduate to lower  intensity sporting activities such as golf and some mild to moderate hiking.  Follow-Up Instructions: No follow-ups on file.   Orders:  No orders of the defined types were placed in this encounter.  No orders of the defined types were placed in this encounter.   Imaging: No results found.  PMFS History: Patient Active Problem List   Diagnosis Date Noted   Rupture of anterior cruciate ligament of right knee    Rupture of posterior cruciate ligament of right knee    Complex tear of lateral meniscus of left knee as current injury    Injury of posterolateral corner of knee, left, subsequent encounter    Tear of LCL (lateral collateral ligament) of knee, left, subsequent encounter    Retained orthopedic hardware    Hyperglycemia 10/02/2021   Hypoalbuminemia 10/02/2021   Elevated alkaline phosphatase level 10/02/2021   S/P ACL reconstruction 09/28/2021   Right knee dislocation, subsequent encounter    TBI (traumatic brain injury) ([facility]) 09/07/2021   Multiple trauma 09/07/2021   Pelvic fracture ([facility]) 08/26/2021   Past Medical History:  Diagnosis Date   Alcohol abuse     Family History  Problem Relation Age of Onset   Kidney disease Mother    Cancer Father    Hypertension Father    Heart  disease Father    Liver cancer Father    Cancer - Other Father    Stroke Brother     Past Surgical History:  Procedure Laterality Date   ANTERIOR CRUCIATE LIGAMENT REPAIR Right 09/28/2021   Procedure: ANTERIOR CRUCIATE LIGAMENT (ACL) & POSTERIOR CRUCIATE LIGAMENT (PCL) REPAIR;  Surgeon: Cammy Copa, MD;  Location: [facility];  Service: Orthopedics;  Laterality: Right;   COSMETIC SURGERY     HARDWARE REMOVAL Right 09/28/2021   Procedure: HARDWARE REMOVAL;  Surgeon: Cammy Copa, MD;  Location: [facility];  Service: Orthopedics;  Laterality: Right;   MEDIAL COLLATERAL LIGAMENT REPAIR, KNEE Right 08/31/2021   Procedure: RIGHT KNEE MEDIAL COLLATERAL LIGAMENT REPAIR/RECONSTRUCTION (ALL  OPEN);  Surgeon: Cammy Copa, MD;  Location: [facility] OR;  Service: Orthopedics;  Laterality: Right;   ORIF RADIAL FRACTURE Right 08/28/2021   Procedure: OPEN REDUCTION INTERNAL FIXATION (ORIF) RADIAL FRACTURE;  Surgeon: Myrene Galas, MD;  Location: [facility] OR;  Service: Orthopedics;  Laterality: Right;   SACRO-ILIAC PINNING Right 08/28/2021   Procedure: SACRO-ILIAC SCREW FIXATION WITH  ANTERIOR PELVIC REPAIR;  Surgeon: Myrene Galas, MD;  Location: [facility];  Service: Orthopedics;  Laterality: Right;   Social History   Occupational History   Occupation: Event organiser: [employer]  Tobacco Use   Smoking status: Never   Smokeless tobacco: Never  Vaping Use   Vaping Use: Never used  Substance and Sexual Activity   Alcohol use: Yes    Alcohol/week: 19.0 standard drinks    Types: 16 Cans of beer, 3 Shots of liquor per week   Drug use: Never   Sexual activity: Not on file

## 2022-02-21 ENCOUNTER — Encounter: Payer: Self-pay | Admitting: Physical Therapy

## 2022-02-21 ENCOUNTER — Other Ambulatory Visit: Payer: Self-pay

## 2022-02-21 ENCOUNTER — Ambulatory Visit: Payer: Medicaid Other | Attending: Orthopedic Surgery | Admitting: Physical Therapy

## 2022-02-21 DIAGNOSIS — R2689 Other abnormalities of gait and mobility: Secondary | ICD-10-CM

## 2022-02-21 DIAGNOSIS — M6281 Muscle weakness (generalized): Secondary | ICD-10-CM

## 2022-02-21 DIAGNOSIS — M25562 Pain in left knee: Secondary | ICD-10-CM | POA: Diagnosis present

## 2022-02-21 DIAGNOSIS — M25561 Pain in right knee: Secondary | ICD-10-CM | POA: Insufficient documentation

## 2022-02-21 DIAGNOSIS — G8929 Other chronic pain: Secondary | ICD-10-CM | POA: Diagnosis present

## 2022-02-21 NOTE — Therapy (Signed)
OUTPATIENT PHYSICAL THERAPY TREATMENT NOTE   Patient Name: Devin Jones MRN: [id number] DOB:11/18/1965, 56 y.o., male Today's Date: 02/21/2022  PCP: Gildardo Pounds, NP REFERRING PROVIDER: Meredith Pel, MD     PT End of Session - 02/21/22 1047     Visit Number 21    Number of Visits 25    Date for PT Re-Evaluation 03/13/22    Authorization Type [employer]    Authorization - Number of Visits 27    PT Start Time 1045    PT Stop Time 1130    PT Time Calculation (min) 45 min    Activity Tolerance Patient tolerated treatment well    Behavior During Therapy WFL for tasks assessed/performed                          Past Medical History:  Diagnosis Date   Alcohol abuse    Past Surgical History:  Procedure Laterality Date   ANTERIOR CRUCIATE LIGAMENT REPAIR Right 09/28/2021   Procedure: ANTERIOR CRUCIATE LIGAMENT (ACL) & POSTERIOR CRUCIATE LIGAMENT (PCL) REPAIR;  Surgeon: Meredith Pel, MD;  Location: [place];  Service: Orthopedics;  Laterality: Right;   COSMETIC SURGERY     HARDWARE REMOVAL Right 09/28/2021   Procedure: HARDWARE REMOVAL;  Surgeon: Meredith Pel, MD;  Location: [place];  Service: Orthopedics;  Laterality: Right;   MEDIAL COLLATERAL LIGAMENT REPAIR, KNEE Right 08/31/2021   Procedure: RIGHT KNEE MEDIAL COLLATERAL LIGAMENT REPAIR/RECONSTRUCTION (ALL OPEN);  Surgeon: Meredith Pel, MD;  Location: [place];  Service: Orthopedics;  Laterality: Right;   ORIF RADIAL FRACTURE Right 08/28/2021   Procedure: OPEN REDUCTION INTERNAL FIXATION (ORIF) RADIAL FRACTURE;  Surgeon: Altamese [place], MD;  Location: [place];  Service: Orthopedics;  Laterality: Right;   SACRO-ILIAC PINNING Right 08/28/2021   Procedure: SACRO-ILIAC SCREW FIXATION WITH  ANTERIOR PELVIC REPAIR;  Surgeon: Altamese [place]ho, MD;  Location: [place];  Service: Orthopedics;  Laterality: Right;   Patient Active Problem List   Diagnosis Date Noted   Rupture of anterior cruciate  ligament of right knee    Rupture of posterior cruciate ligament of right knee    Complex tear of lateral meniscus of left knee as current injury    Injury of posterolateral corner of knee, left, subsequent encounter    Tear of LCL (lateral collateral ligament) of knee, left, subsequent encounter    Retained orthopedic hardware    Hyperglycemia 10/02/2021   Hypoalbuminemia 10/02/2021   Elevated alkaline phosphatase level 10/02/2021   S/P ACL reconstruction 09/28/2021   Right knee dislocation, subsequent encounter    TBI (traumatic brain injury) ([place]) 09/07/2021   Multiple trauma 09/07/2021   Pelvic fracture ([place]) 08/26/2021    REFERRING PROVIDER: Meredith Pel, MD  REFERRING DIAG: Right knee dislocation, Status post left knee surgery  THERAPY DIAG:  Chronic pain of right knee  Chronic pain of left knee  Muscle weakness (generalized)  Other abnormalities of gait and mobility  PERTINENT HISTORY: Right knee open MCL reconstruction on 08/28/2021, ACL/PCL reconstruction on 09/28/2020 Left knee LCL and lateral corner reconstruction on 09/28/2020 Unstable pelvic ring fracture s/p ORIF and SI screws (L to R) on 08/28/2021 Right galeazzi fracture dislocation (radius) s/p ORIF 08/28/2021  PRECAUTIONS: None   SUBJECTIVE: Patient reports he has been progressing well with his walking, he can walk up to 3 miles or so prior to becoming fatigue.   PAIN:  Are you having pain? No NPRS scale: 0/10 (increase  in pain with activity) Pain location: Knee Pain orientation: Bilateral PAIN TYPE: Surgical  Pain description: Intermittent, twinge Aggravating factors: Keeping knees static (bent or straight) Relieving factors: Medication  PATIENT GOALS: Get back to walking normally, start cycling again   OBJECTIVE:  MUSCLE LENGTH: Slight limitation of bilateral calf and hamstring, quad flexibility not assessed on evaluation   LE AROM:   AROM Right 11/27/2021 Left 11/27/2021  Right 12/20/2021 Left 12/20/2021 Rt / Lt 12/26/2021  Knee flexion 118 133 127 135 130 / 135  Knee extension 0 0   0 / 0    LE MMT:  Right 12/26/2021 Left 12/26/2021 Rt / Lt 01/16/2022 Rt / Lt 01/23/2022 Rt / Lt 02/05/2022  Knee flexion 4 4 4+ / 4+ 4+ / 4+ 4+ / 4+  Knee extension 4 4 4+ / 4+ 4+ / 4+ 4+ / 4+     FUNCTIONAL TESTS:  Patient able to perform sit to stand from standard chair with hands on thighs - 11/16/2021  6MWT: 1590 without AD - 01/30/2022 (1365 without AD - 01/09/2022; 815 using SPC on left side - 11/16/2021; 365 using RW 11/02/2021)  Age matched norm: 1830 ft   GAIT:  Distance walked: 1590 - 01/30/2022 Assistive device utilized: None Level of assistance: Independent Comments: grossly WFL    TODAY'S TREATMENT: 02/21/2022:  Therapeutic Exercise: Elliptical L5 R5 x 5 min while taking subjective Leg press (cybex) DL:150# 3 x 10 Bulgarian split squat 3 x 6 each Lateral band walk with black at knees 3 x 20 each SL heel raises 2 x 20 each Hex bar deadlift 95# 3 x 8 Front plank 2 x 2 30 sec hold Knee extension machine  DL: 50# 2 x 10, 55# x 10 SL: 20# 2 x 10 each   02/05/2022:  Therapeutic Exercise: Elliptical L5 R5 x 5 min while taking subjective Leg press (cybex)  DL:150# 3 x 10 SL: 50# 2 x 10 each Forward step-up on 10" box holding 5# bilat x 10 each, with 10# bilat x 10 SL heel raises on leg press 50# 2 x 20 each Lateral band walk with green at mid shin 2 x 20 each Knee extension machine 50# 2 x 10  01/30/2022:  Therapeutic Exercise: Elliptical L5 R1 x 5 min while taking subjective 6MWT - workload capacity training Leg press (cybex)  DL:140# 3 x 10 SL: 50# 2 x 10 each Knee extension machine 50# 3 x 8 Knee flexion machine 50# 3 x 8 SLS on Airex 3 x 30 sec each Forward step-up on 10" box 2 x 10 each SL heel raises 2 x 20 each  01/23/2022:  Therapeutic Exercise: Elliptical L5 R1 x 5 min while taking subjective Leg press (cybex) DL:140# 3 x 10 Bulgarian split  squat with 25# ipsilateral and contralateral UE support for balance 3 x 8 each Modified side plank and clamshell 3 x 10 each Forward step-up on 10" box 2 x 10 each SL heel raises 2 x 20 each Knee extension machine 35# 3 x 10 Knee flexion machine 35# 3 x 10   PATIENT EDUCATION:  Education details: HEP, continue walking progression Person educated: Patient Education method: Explanation, Demonstration, Tactile cues, Verbal cues Education comprehension: verbalized understanding, returned demonstration, verbal cues required, tactile cues required, and needs further education   HOME EXERCISE PROGRAM: Access Code: [email or web address]     ASSESSMENT: CLINICAL IMPRESSION: Patient tolerated therapy well with no adverse effects. Therapy continues to focus on progression of strength and stability with  good tolerance. He is progressing with weight and resistance with all exercises and reports improvement in his walking ability and general community mobility. He denies any pain with exercise or activity, but does note fatigue with extended periods of activity. No changes to HEP this visit, encouraged to continue progressing walking tolerance. Patient would benefit from continued skilled PT to progress his mobility and strength to return to walking and prior activity level without limitation.  Objective impairments include Abnormal gait, decreased balance, difficulty walking, decreased ROM, decreased strength, increased edema, impaired flexibility, improper body mechanics, and pain.      GOALS: Goals reviewed with patient? Yes   SHORT TERM GOALS:   STG Name Target Date Goal status  1 Patient will be I with initial HEP in order to progress with therapy. Baseline: HEP provided at eval 11/02/2021: Pt reports daily adherence to his HEP 11/28/2021 ACHIEVED  2 Patient will demonstrate SLR without extension lag to indicate improve quad control/strength Baseline: patient exhibits slight extension lag and endurance  deficit with SLR 11/16/2021: able to perform SLR without extension lag 11/28/2021 ACHIEVED  3 Patient will achieve >/= 120 deg knee flexion AROM bilaterally to improve transfer ability Baseline: 105 deg right knee flexion 11/16/2021: left 120 deg, right 118 deg 11/27/2021: left 133 deg, right 118 deg 11/28/2021 ACHIEVED  4 Patient will be able to ambulate household distances using LRAD in order to improve independence and ability to perform self care tasks Baseline: patient is using wheel chair for all mobility 11/02/2021: Pt able to ambulate 365 feet in 6 minutes 11/28/2021 ACHIEVED    LONG TERM GOALS:    LTG Name Target Date Goal status  1 Patient will be I with final HEP to maintain progress from PT. Baseline: HEP provided at eval 12/26/2021: progressing with HEP 03/13/2022 ONGOING  2 Patient will be able to ambulate community level distances with LRAD in order to improve community access and ability to return to work Baseline: patient using wheel chair for all mobility at evaluation 12/26/2021: patient no longer using AD, but reports limitation in walking stamina 01/09/2022: patient demonstrates improvement with 6MWT without AD but still below age matched norms 01/30/2022: 41 ft (age matched norm 1830 ft) 03/13/2022 ONGOING  3 Patient will exhibit bilateral knee AROM grossly WFL and non-painful to allow for ability to return to cycling Baseline: patient demonstrates limitations with knee AROM 12/26/2021: knee AROM grossly [facility] 02/06/2022 MET  4 Patient will demonstrate bilateral knee strength 5/5 MMT in order to improve ability to squat/stoop to perform work related tasks Baseline: not formally assessed at evaluation, he demonstrates gross strength deficit with decreased quad control 12/26/2021: grossly 4/5 MMT bilaterally 01/30/2022: grossly 4+/5 MMT bilaterall 03/13/2022 ONGOING  5 Patient will report </= 2/10 pain with all activity in order to reduce functional limitation and allow for return to  prior level of function Baseline: patient reports increased pain with activity 12/26/2021: patient denies knee pain with activity 02/06/2022 MET      PLAN: PT FREQUENCY: 1x/week   PT DURATION: 6 weeks   PLANNED INTERVENTIONS: Therapeutic exercises, Therapeutic activity, Neuro Muscular re-education, Balance training, Gait training, Patient/Family education, Joint mobilization, Aquatic Therapy, Dry Needling, Electrical stimulation, Cryotherapy, Moist heat, scar mobilization, Taping, Vasopneumatic device, Ionotophoresis 66m/ml Dexamethasone, and Manual therapy   PLAN FOR NEXT SESSION: Review HEP and progress PRN, progress strength and stability   CHilda Blades PT, DPT, LAT, ATC 02/21/22  11:31 AM Phone: [phone]: [phone]

## 2022-02-26 NOTE — Therapy (Signed)
OUTPATIENT PHYSICAL THERAPY TREATMENT NOTE   Patient Name: Devin Jones MRN: [id number] DOB:08-25-1966, 56 y.o., male Today's Date: 02/27/2022  PCP: Gildardo Pounds, NP REFERRING PROVIDER: Meredith Pel, MD     PT End of Session - 02/27/22 1228     Visit Number 22    Number of Visits 25    Date for PT Re-Evaluation 03/13/22    Authorization Type [employer]    Authorization - Number of Visits 27    PT Start Time 1215    PT Stop Time 1300    PT Time Calculation (min) 45 min    Activity Tolerance Patient tolerated treatment well    Behavior During Therapy WFL for tasks assessed/performed                           Past Medical History:  Diagnosis Date   Alcohol abuse    Past Surgical History:  Procedure Laterality Date   ANTERIOR CRUCIATE LIGAMENT REPAIR Right 09/28/2021   Procedure: ANTERIOR CRUCIATE LIGAMENT (ACL) & POSTERIOR CRUCIATE LIGAMENT (PCL) REPAIR;  Surgeon: Meredith Pel, MD;  Location: [place];  Service: Orthopedics;  Laterality: Right;   COSMETIC SURGERY     HARDWARE REMOVAL Right 09/28/2021   Procedure: HARDWARE REMOVAL;  Surgeon: Meredith Pel, MD;  Location: [place];  Service: Orthopedics;  Laterality: Right;   MEDIAL COLLATERAL LIGAMENT REPAIR, KNEE Right 08/31/2021   Procedure: RIGHT KNEE MEDIAL COLLATERAL LIGAMENT REPAIR/RECONSTRUCTION (ALL OPEN);  Surgeon: Meredith Pel, MD;  Location: [place];  Service: Orthopedics;  Laterality: Right;   ORIF RADIAL FRACTURE Right 08/28/2021   Procedure: OPEN REDUCTION INTERNAL FIXATION (ORIF) RADIAL FRACTURE;  Surgeon: Altamese [place], MD;  Location: [place];  Service: Orthopedics;  Laterality: Right;   SACRO-ILIAC PINNING Right 08/28/2021   Procedure: SACRO-ILIAC SCREW FIXATION WITH  ANTERIOR PELVIC REPAIR;  Surgeon: Altamese [place], MD;  Location: [place];  Service: Orthopedics;  Laterality: Right;   Patient Active Problem List   Diagnosis Date Noted   Rupture of anterior cruciate  ligament of right knee    Rupture of posterior cruciate ligament of right knee    Complex tear of lateral meniscus of left knee as current injury    Injury of posterolateral corner of knee, left, subsequent encounter    Tear of LCL (lateral collateral ligament) of knee, left, subsequent encounter    Retained orthopedic hardware    Hyperglycemia 10/02/2021   Hypoalbuminemia 10/02/2021   Elevated alkaline phosphatase level 10/02/2021   S/P ACL reconstruction 09/28/2021   Right knee dislocation, subsequent encounter    TBI (traumatic brain injury) ([place]) 09/07/2021   Multiple trauma 09/07/2021   Pelvic fracture ([place]) 08/26/2021    REFERRING PROVIDER: Meredith Pel, MD  REFERRING DIAG: Right knee dislocation, Status post left knee surgery  THERAPY DIAG:  Chronic pain of right knee  Chronic pain of left knee  Muscle weakness (generalized)  Other abnormalities of gait and mobility  PERTINENT HISTORY: Right knee open MCL reconstruction on 08/28/2021, ACL/PCL reconstruction on 09/28/2020 Left knee LCL and lateral corner reconstruction on 09/28/2020 Unstable pelvic ring fracture s/p ORIF and SI screws (L to R) on 08/28/2021 Right galeazzi fracture dislocation (radius) s/p ORIF 08/28/2021  PRECAUTIONS: None   SUBJECTIVE: Patient reports he is doing well with no new issues. He has been continuing to work on his walking.  PAIN:  Are you having pain? No NPRS scale: 0/10 Pain location: Knee Pain orientation:  Bilateral PAIN TYPE: Surgical  Pain description: Intermittent, twinge Aggravating factors: Keeping knees static (bent or straight) Relieving factors: Medication  PATIENT GOALS: Get back to walking normally, start cycling again   OBJECTIVE:  MUSCLE LENGTH: Slight limitation of bilateral calf and hamstring, quad flexibility not assessed on evaluation   LE AROM:   AROM Right 11/27/2021 Left 11/27/2021 Right 12/20/2021 Left 12/20/2021 Rt / Lt 12/26/2021  Knee flexion  118 133 127 135 130 / 135  Knee extension 0 0   0 / 0    LE MMT:  Right 12/26/2021 Left 12/26/2021 Rt / Lt 01/16/2022 Rt / Lt 01/23/2022 Rt / Lt 02/05/2022  Knee flexion 4 4 4+ / 4+ 4+ / 4+ 4+ / 4+  Knee extension 4 4 4+ / 4+ 4+ / 4+ 4+ / 4+     FUNCTIONAL TESTS:  Patient able to perform sit to stand from standard chair with hands on thighs - 11/16/2021  6MWT: 1590 without AD - 01/30/2022 (1365 without AD - 01/09/2022; 815 using SPC on left side - 11/16/2021; 365 using RW 11/02/2021)  Age matched norm: 1830 ft   GAIT:  Distance walked: 1590 - 01/30/2022 Assistive device utilized: None Level of assistance: Independent Comments: grossly WFL    TODAY'S TREATMENT: 02/27/2022:  Therapeutic Exercise: Elliptical L5 R5 x 5 min while taking subjective Leg press (cybex)  DL:160# x 10, 170# 2 x 10 SL: 60# 2 x 10 each Heel raises on leg press 100# 3 x 20 Forward step-up on 12" box holding 10# bilat 2 x 10 SLS on Airex with 5# kettlebell pass 3 x 20 Lateral band walk with black at knees 3 x 20 each Front plank 2 x 2 30 sec hold   02/21/2022:  Therapeutic Exercise: Elliptical L5 R5 x 5 min while taking subjective Leg press (cybex) DL:150# 3 x 10 Bulgarian split squat 3 x 6 each Lateral band walk with black at knees 3 x 20 each SL heel raises 2 x 20 each Hex bar deadlift 95# 3 x 8 Front plank 2 x 2 30 sec hold Knee extension machine  DL: 50# 2 x 10, 55# x 10 SL: 20# 2 x 10 each  02/05/2022:  Therapeutic Exercise: Elliptical L5 R5 x 5 min while taking subjective Leg press (cybex)  DL:150# 3 x 10 SL: 50# 2 x 10 each Forward step-up on 10" box holding 5# bilat x 10 each, with 10# bilat x 10 SL heel raises on leg press 50# 2 x 20 each Lateral band walk with green at mid shin 2 x 20 each Knee extension machine 50# 2 x 10  01/30/2022:  Therapeutic Exercise: Elliptical L5 R1 x 5 min while taking subjective 6MWT - workload capacity training Leg press (cybex)  DL:140# 3 x 10 SL: 50# 2 x 10  each Knee extension machine 50# 3 x 8 Knee flexion machine 50# 3 x 8 SLS on Airex 3 x 30 sec each Forward step-up on 10" box 2 x 10 each SL heel raises 2 x 20 each   PATIENT EDUCATION:  Education details: HEP, continue walking progression Person educated: Patient Education method: Explanation, Demonstration, Tactile cues, Verbal cues Education comprehension: verbalized understanding, returned demonstration, verbal cues required, tactile cues required, and needs further education   HOME EXERCISE PROGRAM: Access Code: [email or web address]     ASSESSMENT: CLINICAL IMPRESSION: Patient tolerated therapy well with no adverse effects. Therapy continues to focus on progressing strength and single leg stability. He is progressing well in  therapy, tolerating increased weight and resistance, and is not having any pain with exercise or walking. No changes to HEP this visit, patient continues to be encouraged to progress walking tolerance. Patient would benefit from continued skilled PT to progress his mobility and strength to return to walking and prior activity level without limitation.  Objective impairments include Abnormal gait, decreased balance, difficulty walking, decreased ROM, decreased strength, increased edema, impaired flexibility, improper body mechanics, and pain.      GOALS: Goals reviewed with patient? Yes   SHORT TERM GOALS:   STG Name Target Date Goal status  1 Patient will be I with initial HEP in order to progress with therapy. Baseline: HEP provided at eval 11/02/2021: Pt reports daily adherence to his HEP 11/28/2021 ACHIEVED  2 Patient will demonstrate SLR without extension lag to indicate improve quad control/strength Baseline: patient exhibits slight extension lag and endurance deficit with SLR 11/16/2021: able to perform SLR without extension lag 11/28/2021 ACHIEVED  3 Patient will achieve >/= 120 deg knee flexion AROM bilaterally to improve transfer ability Baseline: 105 deg right  knee flexion 11/16/2021: left 120 deg, right 118 deg 11/27/2021: left 133 deg, right 118 deg 11/28/2021 ACHIEVED  4 Patient will be able to ambulate household distances using LRAD in order to improve independence and ability to perform self care tasks Baseline: patient is using wheel chair for all mobility 11/02/2021: Pt able to ambulate 365 feet in 6 minutes 11/28/2021 ACHIEVED    LONG TERM GOALS:    LTG Name Target Date Goal status  1 Patient will be I with final HEP to maintain progress from PT. Baseline: HEP provided at eval 12/26/2021: progressing with HEP 03/13/2022 ONGOING  2 Patient will be able to ambulate community level distances with LRAD in order to improve community access and ability to return to work Baseline: patient using wheel chair for all mobility at evaluation 12/26/2021: patient no longer using AD, but reports limitation in walking stamina 01/09/2022: patient demonstrates improvement with 6MWT without AD but still below age matched norms 01/30/2022: 80 ft (age matched norm 1830 ft) 03/13/2022 ONGOING  3 Patient will exhibit bilateral knee AROM grossly WFL and non-painful to allow for ability to return to cycling Baseline: patient demonstrates limitations with knee AROM 12/26/2021: knee AROM grossly [facility] 02/06/2022 MET  4 Patient will demonstrate bilateral knee strength 5/5 MMT in order to improve ability to squat/stoop to perform work related tasks Baseline: not formally assessed at evaluation, he demonstrates gross strength deficit with decreased quad control 12/26/2021: grossly 4/5 MMT bilaterally 01/30/2022: grossly 4+/5 MMT bilaterall 03/13/2022 ONGOING  5 Patient will report </= 2/10 pain with all activity in order to reduce functional limitation and allow for return to prior level of function Baseline: patient reports increased pain with activity 12/26/2021: patient denies knee pain with activity 02/06/2022 MET      PLAN: PT FREQUENCY: 1x/week   PT DURATION: 6 weeks    PLANNED INTERVENTIONS: Therapeutic exercises, Therapeutic activity, Neuro Muscular re-education, Balance training, Gait training, Patient/Family education, Joint mobilization, Aquatic Therapy, Dry Needling, Electrical stimulation, Cryotherapy, Moist heat, scar mobilization, Taping, Vasopneumatic device, Ionotophoresis 54m/ml Dexamethasone, and Manual therapy   PLAN FOR NEXT SESSION: Review HEP and progress PRN, progress strength and stability   CHilda Blades PT, DPT, LAT, ATC 02/27/22  1:12 PM Phone: [phone]: [phone]

## 2022-02-27 ENCOUNTER — Other Ambulatory Visit: Payer: Self-pay

## 2022-02-27 ENCOUNTER — Ambulatory Visit: Payer: Medicaid Other | Admitting: Physical Therapy

## 2022-02-27 ENCOUNTER — Encounter: Payer: Self-pay | Admitting: Physical Therapy

## 2022-02-27 DIAGNOSIS — M6281 Muscle weakness (generalized): Secondary | ICD-10-CM

## 2022-02-27 DIAGNOSIS — R2689 Other abnormalities of gait and mobility: Secondary | ICD-10-CM

## 2022-02-27 DIAGNOSIS — G8929 Other chronic pain: Secondary | ICD-10-CM

## 2022-02-27 DIAGNOSIS — M25561 Pain in right knee: Secondary | ICD-10-CM | POA: Diagnosis not present

## 2022-03-05 NOTE — Therapy (Addendum)
OUTPATIENT PHYSICAL THERAPY TREATMENT NOTE  DISCHARGE   Patient Name: Devin Jones MRN: [id number] DOB:04-07-1966, 56 y.o., male Today's Date: 03/06/2022  PCP: Gildardo Pounds, NP REFERRING PROVIDER: Meredith Pel, MD     PT End of Session - 03/06/22 1218     Visit Number 23    Number of Visits 25    Date for PT Re-Evaluation 03/13/22    Authorization Type [employer]    Authorization - Number of Visits 27    PT Start Time 1215    PT Stop Time 1300    PT Time Calculation (min) 45 min    Activity Tolerance Patient tolerated treatment well    Behavior During Therapy WFL for tasks assessed/performed                            Past Medical History:  Diagnosis Date   Alcohol abuse    Past Surgical History:  Procedure Laterality Date   ANTERIOR CRUCIATE LIGAMENT REPAIR Right 09/28/2021   Procedure: ANTERIOR CRUCIATE LIGAMENT (ACL) & POSTERIOR CRUCIATE LIGAMENT (PCL) REPAIR;  Surgeon: Meredith Pel, MD;  Location: [place];  Service: Orthopedics;  Laterality: Right;   COSMETIC SURGERY     HARDWARE REMOVAL Right 09/28/2021   Procedure: HARDWARE REMOVAL;  Surgeon: Meredith Pel, MD;  Location: [place];  Service: Orthopedics;  Laterality: Right;   MEDIAL COLLATERAL LIGAMENT REPAIR, KNEE Right 08/31/2021   Procedure: RIGHT KNEE MEDIAL COLLATERAL LIGAMENT REPAIR/RECONSTRUCTION (ALL OPEN);  Surgeon: Meredith Pel, MD;  Location: [place];  Service: Orthopedics;  Laterality: Right;   ORIF RADIAL FRACTURE Right 08/28/2021   Procedure: OPEN REDUCTION INTERNAL FIXATION (ORIF) RADIAL FRACTURE;  Surgeon: Altamese Snowflake, MD;  Location: [place];  Service: Orthopedics;  Laterality: Right;   SACRO-ILIAC PINNING Right 08/28/2021   Procedure: SACRO-ILIAC SCREW FIXATION WITH  ANTERIOR PELVIC REPAIR;  Surgeon: Altamese Largo, MD;  Location: [place];  Service: Orthopedics;  Laterality: Right;   Patient Active Problem List   Diagnosis Date Noted   Rupture of  anterior cruciate ligament of right knee    Rupture of posterior cruciate ligament of right knee    Complex tear of lateral meniscus of left knee as current injury    Injury of posterolateral corner of knee, left, subsequent encounter    Tear of LCL (lateral collateral ligament) of knee, left, subsequent encounter    Retained orthopedic hardware    Hyperglycemia 10/02/2021   Hypoalbuminemia 10/02/2021   Elevated alkaline phosphatase level 10/02/2021   S/P ACL reconstruction 09/28/2021   Right knee dislocation, subsequent encounter    TBI (traumatic brain injury) ([place]) 09/07/2021   Multiple trauma 09/07/2021   Pelvic fracture ([place]) 08/26/2021    REFERRING PROVIDER: Meredith Pel, MD  REFERRING DIAG: Right knee dislocation, Status post left knee surgery  THERAPY DIAG:  Chronic pain of right knee  Chronic pain of left knee  Muscle weakness (generalized)  Other abnormalities of gait and mobility  PERTINENT HISTORY: Right knee open MCL reconstruction on 08/28/2021, ACL/PCL reconstruction on 09/28/2020 Left knee LCL and lateral corner reconstruction on 09/28/2020 Unstable pelvic ring fracture s/p ORIF and SI screws (L to R) on 08/28/2021 Right galeazzi fracture dislocation (radius) s/p ORIF 08/28/2021  PRECAUTIONS: None   SUBJECTIVE: Patient reports he continues to do well, he is progressing his walking with no pain and feels he is getting around much better.  PAIN:  Are you having pain? No NPRS  scale: 0/10 Pain location: Knee Pain orientation: Bilateral PAIN TYPE: Surgical  Pain description: Intermittent, twinge Aggravating factors: Keeping knees static (bent or straight) Relieving factors: Medication  PATIENT GOALS: Get back to walking normally, start cycling again   OBJECTIVE:  MUSCLE LENGTH: Slight limitation of bilateral calf and hamstring, quad flexibility not assessed on evaluation   LE AROM:   AROM Right 11/27/2021 Left 11/27/2021 Right 12/20/2021  Left 12/20/2021 Rt / Lt 12/26/2021  Knee flexion 118 133 127 135 130 / 135  Knee extension 0 0   0 / 0    LE MMT:  Right 12/26/2021 Left 12/26/2021 Rt / Lt 01/16/2022 Rt / Lt 01/23/2022 Rt / Lt 02/05/2022  Knee flexion 4 4 4+ / 4+ 4+ / 4+ 4+ / 4+  Knee extension 4 4 4+ / 4+ 4+ / 4+ 4+ / 4+     FUNCTIONAL TESTS:  Patient able to perform sit to stand from standard chair with hands on thighs - 11/16/2021  6MWT: 1590 without AD - 01/30/2022 (1365 without AD - 01/09/2022; 815 using SPC on left side - 11/16/2021; 365 using RW 11/02/2021)  Age matched norm: 1830 ft   GAIT:  Distance walked: 1590 - 01/30/2022 Assistive device utilized: None Level of assistance: Independent Comments: grossly WFL    TODAY'S TREATMENT: 03/06/2022:  Therapeutic Exercise: Elliptical L5 R5 x 5 min while taking subjective Hex bar deadlift 95# x 10, 105# 2 x 10 Squat in doorframe with BUE through full available range x 10 Goblet squat with 10# and heels elevated 2 x 10 through full available range Leg press (cybex)  SL: 60# 3 x 5 each SL heel raises 3 x 20 each SLS on Airex with 5# kettlebell pass 3 x 20 each Bridge with hamstring curl on physioball 2 x 10   02/27/2022:  Therapeutic Exercise: Elliptical L5 R5 x 5 min while taking subjective Leg press (cybex)  DL:160# x 10, 170# 2 x 10 SL: 60# 2 x 10 each Heel raises on leg press 100# 3 x 20 Forward step-up on 12" box holding 10# bilat 2 x 10 SLS on Airex with 5# kettlebell pass 3 x 20 Lateral band walk with black at knees 3 x 20 each Front plank 2 x 2 30 sec hold  02/21/2022:  Therapeutic Exercise: Elliptical L5 R5 x 5 min while taking subjective Leg press (cybex) DL:150# 3 x 10 Bulgarian split squat 3 x 6 each Lateral band walk with black at knees 3 x 20 each SL heel raises 2 x 20 each Hex bar deadlift 95# 3 x 8 Front plank 2 x 2 30 sec hold Knee extension machine  DL: 50# 2 x 10, 55# x 10 SL: 20# 2 x 10 each  02/05/2022:  Therapeutic  Exercise: Elliptical L5 R5 x 5 min while taking subjective Leg press (cybex)  DL:150# 3 x 10 SL: 50# 2 x 10 each Forward step-up on 10" box holding 5# bilat x 10 each, with 10# bilat x 10 SL heel raises on leg press 50# 2 x 20 each Lateral band walk with green at mid shin 2 x 20 each Knee extension machine 50# 2 x 10   PATIENT EDUCATION:  Education details: HEP Person educated: Patient Education method: Explanation, Demonstration, Tactile cues, Verbal cues Education comprehension: verbalized understanding, returned demonstration, verbal cues required, tactile cues required, and needs further education   HOME EXERCISE PROGRAM: Access Code: [email or web address]     ASSESSMENT: CLINICAL IMPRESSION: Patient tolerated therapy well with  no adverse effects. Therapy continues to focus primarily on strength progression with good tolerance. Patient did report concern regarding limitation with deep squats so worked on full range squats and provided patient with options to work on squatting in deeper ranges at home. He continues to progress his walking and seems to be returning to prior level of function. Patient would benefit from continued skilled PT to progress his mobility and strength to return to walking and prior activity level without limitation.   Objective impairments include Abnormal gait, decreased balance, difficulty walking, decreased ROM, decreased strength, increased edema, impaired flexibility, improper body mechanics, and pain.      GOALS: Goals reviewed with patient? Yes   SHORT TERM GOALS:   STG Name Target Date Goal status  1 Patient will be I with initial HEP in order to progress with therapy. Baseline: HEP provided at eval 11/02/2021: Pt reports daily adherence to his HEP 11/28/2021 ACHIEVED  2 Patient will demonstrate SLR without extension lag to indicate improve quad control/strength Baseline: patient exhibits slight extension lag and endurance deficit with SLR 11/16/2021: able to  perform SLR without extension lag 11/28/2021 ACHIEVED  3 Patient will achieve >/= 120 deg knee flexion AROM bilaterally to improve transfer ability Baseline: 105 deg right knee flexion 11/16/2021: left 120 deg, right 118 deg 11/27/2021: left 133 deg, right 118 deg 11/28/2021 ACHIEVED  4 Patient will be able to ambulate household distances using LRAD in order to improve independence and ability to perform self care tasks Baseline: patient is using wheel chair for all mobility 11/02/2021: Pt able to ambulate 365 feet in 6 minutes 11/28/2021 ACHIEVED    LONG TERM GOALS:    LTG Name Target Date Goal status  1 Patient will be I with final HEP to maintain progress from PT. Baseline: HEP provided at eval 12/26/2021: progressing with HEP 03/13/2022 ONGOING  2 Patient will be able to ambulate community level distances with LRAD in order to improve community access and ability to return to work Baseline: patient using wheel chair for all mobility at evaluation 12/26/2021: patient no longer using AD, but reports limitation in walking stamina 01/09/2022: patient demonstrates improvement with 6MWT without AD but still below age matched norms 01/30/2022: 37 ft (age matched norm 1830 ft) 03/13/2022 ONGOING  3 Patient will exhibit bilateral knee AROM grossly WFL and non-painful to allow for ability to return to cycling Baseline: patient demonstrates limitations with knee AROM 12/26/2021: knee AROM grossly [facility] 02/06/2022 MET  4 Patient will demonstrate bilateral knee strength 5/5 MMT in order to improve ability to squat/stoop to perform work related tasks Baseline: not formally assessed at evaluation, he demonstrates gross strength deficit with decreased quad control 12/26/2021: grossly 4/5 MMT bilaterally 01/30/2022: grossly 4+/5 MMT bilaterall 03/13/2022 ONGOING  5 Patient will report </= 2/10 pain with all activity in order to reduce functional limitation and allow for return to prior level of function Baseline:  patient reports increased pain with activity 12/26/2021: patient denies knee pain with activity 02/06/2022 MET      PLAN: PT FREQUENCY: 1x/week   PT DURATION: 6 weeks   PLANNED INTERVENTIONS: Therapeutic exercises, Therapeutic activity, Neuro Muscular re-education, Balance training, Gait training, Patient/Family education, Joint mobilization, Aquatic Therapy, Dry Needling, Electrical stimulation, Cryotherapy, Moist heat, scar mobilization, Taping, Vasopneumatic device, Ionotophoresis 4mg /ml Dexamethasone, and Manual therapy   PLAN FOR NEXT SESSION: Review HEP and progress PRN, progress strength and stability   Hilda Blades, PT, DPT, LAT, ATC 03/06/22  1:00 PM Phone: [phone] Fax:  [phone]     PHYSICAL THERAPY DISCHARGE SUMMARY  Visits from Start of Care: 23  Current functional level related to goals / functional outcomes: See above   Remaining deficits: See above   Education / Equipment: HEP   Patient agrees to discharge. Patient goals were met. Patient is being discharged due to not returning since the last visit.  Hilda Blades, PT, DPT, LAT, ATC 06/05/22  4:53 PM Phone: [phone] Fax: [phone]

## 2022-03-06 ENCOUNTER — Encounter: Payer: Self-pay | Admitting: Physical Therapy

## 2022-03-06 ENCOUNTER — Ambulatory Visit: Payer: Medicaid Other | Admitting: Physical Therapy

## 2022-03-06 ENCOUNTER — Other Ambulatory Visit: Payer: Self-pay

## 2022-03-06 DIAGNOSIS — G8929 Other chronic pain: Secondary | ICD-10-CM

## 2022-03-06 DIAGNOSIS — M25561 Pain in right knee: Secondary | ICD-10-CM | POA: Diagnosis not present

## 2022-03-06 DIAGNOSIS — M6281 Muscle weakness (generalized): Secondary | ICD-10-CM

## 2022-03-06 DIAGNOSIS — R2689 Other abnormalities of gait and mobility: Secondary | ICD-10-CM

## 2022-03-13 ENCOUNTER — Ambulatory Visit: Payer: Medicaid Other | Admitting: Physical Therapy

## 2024-05-22 ENCOUNTER — Ambulatory Visit (HOSPITAL_COMMUNITY)

## 2024-05-22 ENCOUNTER — Emergency Department (HOSPITAL_COMMUNITY): Admitting: Anesthesiology

## 2024-05-22 ENCOUNTER — Encounter (HOSPITAL_COMMUNITY)
Admission: EM | Disposition: A | Discharge status: Home / Self Care | Payer: Self-pay | Source: Ambulatory Visit | Attending: Emergency Medicine

## 2024-05-22 ENCOUNTER — Encounter (HOSPITAL_COMMUNITY): Payer: Self-pay

## 2024-05-22 ENCOUNTER — Emergency Department (HOSPITAL_COMMUNITY)

## 2024-05-22 ENCOUNTER — Ambulatory Visit (INDEPENDENT_AMBULATORY_CARE_PROVIDER_SITE_OTHER)

## 2024-05-22 ENCOUNTER — Ambulatory Visit (HOSPITAL_COMMUNITY)
Admission: EM | Admit: 2024-05-22 | Discharge: 2024-05-22 | Disposition: A | Discharge status: Home / Self Care | Attending: Internal Medicine | Admitting: Internal Medicine

## 2024-05-22 ENCOUNTER — Other Ambulatory Visit: Payer: Self-pay

## 2024-05-22 ENCOUNTER — Ambulatory Visit (HOSPITAL_COMMUNITY)
Admission: EM | Admit: 2024-05-22 | Discharge: 2024-05-22 | Disposition: A | Discharge status: Home / Self Care | Source: Ambulatory Visit | Attending: Emergency Medicine | Admitting: Emergency Medicine

## 2024-05-22 DIAGNOSIS — S43014A Anterior dislocation of right humerus, initial encounter: Secondary | ICD-10-CM | POA: Insufficient documentation

## 2024-05-22 DIAGNOSIS — M79621 Pain in right upper arm: Secondary | ICD-10-CM | POA: Diagnosis not present

## 2024-05-22 DIAGNOSIS — M25511 Pain in right shoulder: Secondary | ICD-10-CM

## 2024-05-22 DIAGNOSIS — S43004A Unspecified dislocation of right shoulder joint, initial encounter: Secondary | ICD-10-CM

## 2024-05-22 DIAGNOSIS — W19XXXA Unspecified fall, initial encounter: Secondary | ICD-10-CM | POA: Diagnosis not present

## 2024-05-22 HISTORY — PX: SHOULDER CLOSED REDUCTION: SHX1051

## 2024-05-22 HISTORY — DX: Other specified health status: Z78.9

## 2024-05-22 LAB — POCT I-STAT, CHEM 8
BUN: 13 mg/dL (ref 6–20)
Calcium, Ion: 1.1 mmol/L — ABNORMAL LOW (ref 1.15–1.40)
Chloride: 105 mmol/L (ref 98–111)
Creatinine, Ser: 1 mg/dL (ref 0.61–1.24)
Glucose, Bld: 102 mg/dL — ABNORMAL HIGH (ref 70–99)
HCT: 47 % (ref 39.0–52.0)
Hemoglobin: 16 g/dL (ref 13.0–17.0)
Potassium: 4 mmol/L (ref 3.5–5.1)
Sodium: 139 mmol/L (ref 135–145)
TCO2: 23 mmol/L (ref 22–32)

## 2024-05-22 SURGERY — MANIPULATION, JOINT, SHOULDER, WITH ANESTHESIA
Anesthesia: General | Site: Shoulder | Laterality: Right

## 2024-05-22 MED ORDER — KETAMINE HCL 50 MG/5ML IJ SOSY
40.0000 mg | PREFILLED_SYRINGE | Freq: Once | INTRAMUSCULAR | Status: DC
  Filled 2024-05-22: qty 5

## 2024-05-22 MED ORDER — KETAMINE HCL 10 MG/ML IJ SOLN
INTRAMUSCULAR | Status: AC | PRN
  Administered 2024-05-22: 130 mg via INTRAVENOUS

## 2024-05-22 MED ORDER — MIDAZOLAM HCL 2 MG/2ML IJ SOLN
INTRAMUSCULAR | Status: AC
  Filled 2024-05-22: qty 2

## 2024-05-22 MED ORDER — LIDOCAINE 2% (20 MG/ML) 5 ML SYRINGE
INTRAMUSCULAR | Status: DC | PRN
  Administered 2024-05-22: 60 mg via INTRAVENOUS

## 2024-05-22 MED ORDER — SUCCINYLCHOLINE CHLORIDE 200 MG/10ML IV SOSY
PREFILLED_SYRINGE | INTRAVENOUS | Status: DC | PRN
  Administered 2024-05-22: 100 mg via INTRAVENOUS

## 2024-05-22 MED ORDER — TRAMADOL HCL 50 MG PO TABS: 50.0000 mg | ORAL_TABLET | Freq: Two times a day (BID) | ORAL | 0 refills | Status: AC | PRN

## 2024-05-22 MED ORDER — ROCURONIUM BROMIDE 10 MG/ML (PF) SYRINGE
PREFILLED_SYRINGE | INTRAVENOUS | Status: AC
  Filled 2024-05-22: qty 10

## 2024-05-22 MED ORDER — CHLORHEXIDINE GLUCONATE 4 % EX SOLN: 60.0000 mL | Freq: Once | CUTANEOUS | Status: DC

## 2024-05-22 MED ORDER — MIDAZOLAM HCL 2 MG/2ML IJ SOLN
INTRAMUSCULAR | Status: DC | PRN
  Administered 2024-05-22: 2 mg via INTRAVENOUS

## 2024-05-22 MED ORDER — FENTANYL CITRATE PF 50 MCG/ML IJ SOSY
100.0000 ug | PREFILLED_SYRINGE | Freq: Once | INTRAMUSCULAR | Status: AC
  Administered 2024-05-22: 100 ug via INTRAVENOUS

## 2024-05-22 MED ORDER — MORPHINE SULFATE (PF) 4 MG/ML IV SOLN
INTRAVENOUS | Status: DC | PRN
  Administered 2024-05-22: 8 mg via INTRAVENOUS

## 2024-05-22 MED ORDER — PROPOFOL 10 MG/ML IV BOLUS
INTRAVENOUS | Status: DC | PRN
  Administered 2024-05-22: 150 mg via INTRAVENOUS

## 2024-05-22 MED ORDER — CLONIDINE HCL (ANALGESIA) 100 MCG/ML EP SOLN
EPIDURAL | Status: DC | PRN
Start: 2024-05-22 — End: 2024-05-22
  Administered 2024-05-22: 100 ug

## 2024-05-22 MED ORDER — TRAMADOL HCL 50 MG PO TABS: 50.0000 mg | ORAL_TABLET | Freq: Two times a day (BID) | ORAL | 0 refills | Status: DC | PRN

## 2024-05-22 MED ORDER — PHENYLEPHRINE 80 MCG/ML (10ML) SYRINGE FOR IV PUSH (FOR BLOOD PRESSURE SUPPORT)
PREFILLED_SYRINGE | INTRAVENOUS | Status: DC | PRN
  Administered 2024-05-22: 160 ug via INTRAVENOUS

## 2024-05-22 MED ORDER — SUCCINYLCHOLINE CHLORIDE 200 MG/10ML IV SOSY
PREFILLED_SYRINGE | INTRAVENOUS | Status: AC
  Filled 2024-05-22: qty 10

## 2024-05-22 MED ORDER — FENTANYL CITRATE PF 50 MCG/ML IJ SOSY
150.0000 ug | PREFILLED_SYRINGE | Freq: Once | INTRAMUSCULAR | Status: DC
  Filled 2024-05-22: qty 3

## 2024-05-22 MED ORDER — FENTANYL CITRATE (PF) 250 MCG/5ML IJ SOLN
INTRAMUSCULAR | Status: DC | PRN
  Administered 2024-05-22 (×2): 50 ug via INTRAVENOUS

## 2024-05-22 MED ORDER — CLONIDINE HCL (ANALGESIA) 100 MCG/ML EP SOLN
EPIDURAL | Status: AC
  Filled 2024-05-22: qty 10

## 2024-05-22 MED ORDER — MIDAZOLAM HCL 2 MG/2ML IJ SOLN
2.0000 mg | Freq: Once | INTRAMUSCULAR | Status: AC
  Administered 2024-05-22: 2 mg via INTRAVENOUS
  Filled 2024-05-22: qty 2

## 2024-05-22 MED ORDER — CHLORHEXIDINE GLUCONATE 0.12 % MT SOLN: 15.0000 mL | Freq: Once | OROMUCOSAL | Status: AC

## 2024-05-22 MED ORDER — CHLORHEXIDINE GLUCONATE 0.12 % MT SOLN
OROMUCOSAL | Status: AC
  Administered 2024-05-22: 15 mL via OROMUCOSAL
  Filled 2024-05-22: qty 15

## 2024-05-22 MED ORDER — ONDANSETRON HCL 4 MG/2ML IJ SOLN
INTRAMUSCULAR | Status: DC | PRN
  Administered 2024-05-22: 4 mg via INTRAVENOUS

## 2024-05-22 MED ORDER — LACTATED RINGERS IV SOLN: INTRAVENOUS | Status: DC

## 2024-05-22 MED ORDER — MORPHINE SULFATE (PF) 4 MG/ML IV SOLN
INTRAVENOUS | Status: AC
  Filled 2024-05-22: qty 2

## 2024-05-22 MED ORDER — BUPIVACAINE-EPINEPHRINE (PF) 0.25% -1:200000 IJ SOLN
INTRAMUSCULAR | Status: AC
  Filled 2024-05-22: qty 30

## 2024-05-22 MED ORDER — DEXAMETHASONE SODIUM PHOSPHATE 10 MG/ML IJ SOLN
INTRAMUSCULAR | Status: DC | PRN
  Administered 2024-05-22: 10 mg via INTRAVENOUS

## 2024-05-22 MED ORDER — HYDROMORPHONE HCL 1 MG/ML IJ SOLN: 0.2500 mg | INTRAMUSCULAR | Status: DC | PRN

## 2024-05-22 MED ORDER — POVIDONE-IODINE 10 % EX SWAB: 2.0000 | Freq: Once | CUTANEOUS | Status: DC

## 2024-05-22 MED ORDER — PROPOFOL 10 MG/ML IV BOLUS
INTRAVENOUS | Status: AC
  Filled 2024-05-22: qty 20

## 2024-05-22 MED ORDER — ORAL CARE MOUTH RINSE: 15.0000 mL | Freq: Once | OROMUCOSAL | Status: AC

## 2024-05-22 MED ORDER — LIDOCAINE HCL (PF) 1 % IJ SOLN
10.0000 mL | Freq: Once | INTRAMUSCULAR | Status: AC
  Administered 2024-05-22: 10 mL
  Filled 2024-05-22: qty 10

## 2024-05-22 MED ORDER — KETAMINE HCL 50 MG/5ML IJ SOSY
1.0000 mg/kg | PREFILLED_SYRINGE | Freq: Once | INTRAMUSCULAR | Status: DC
  Filled 2024-05-22: qty 10

## 2024-05-22 MED ORDER — KETAMINE HCL 50 MG/5ML IJ SOSY
PREFILLED_SYRINGE | INTRAMUSCULAR | Status: AC
  Filled 2024-05-22: qty 10

## 2024-05-22 MED ORDER — BUPIVACAINE-EPINEPHRINE (PF) 0.25% -1:200000 IJ SOLN
INTRAMUSCULAR | Status: DC | PRN
  Administered 2024-05-22: 20 mL

## 2024-05-22 MED ORDER — FENTANYL CITRATE (PF) 250 MCG/5ML IJ SOLN
INTRAMUSCULAR | Status: AC
  Filled 2024-05-22: qty 5

## 2024-05-22 MED ORDER — LIDOCAINE 2% (20 MG/ML) 5 ML SYRINGE
INTRAMUSCULAR | Status: AC
  Filled 2024-05-22: qty 5

## 2024-05-22 MED ORDER — KETAMINE HCL 50 MG/5ML IJ SOSY: 1.0000 mg/kg | PREFILLED_SYRINGE | Freq: Once | INTRAMUSCULAR | Status: DC

## 2024-05-22 MED ORDER — PHENYLEPHRINE 80 MCG/ML (10ML) SYRINGE FOR IV PUSH (FOR BLOOD PRESSURE SUPPORT)
PREFILLED_SYRINGE | INTRAVENOUS | Status: AC
Start: 2024-05-22 — End: 2024-05-22
  Filled 2024-05-22: qty 10

## 2024-05-22 SURGICAL SUPPLY — 3 items
KIT TURNOVER KIT B (KITS) ×1 IMPLANT
PAD ARMBOARD POSITIONER FOAM (MISCELLANEOUS) ×2 IMPLANT
SLING ARM IMMOBILIZER MED (SOFTGOODS) IMPLANT

## 2024-05-22 NOTE — Discharge Instructions (Addendum)
 X-ray of the shoulder and humerus done today.  No abnormality seen in the humerus however the right shoulder is dislocated.  As this has been 2 weeks and the shoulder is dislocated, reduction still need to be attempted.  We will send to the emergency room where more advanced intervention can be performed.  Reassuringly the arm is neurovascularly intact.

## 2024-05-22 NOTE — Anesthesia Preprocedure Evaluation (Addendum)
 Anesthesia Evaluation  Patient identified by MRN, date of birth, ID band Patient awake    Reviewed: Allergy & Precautions, H&P , NPO status , Patient's Chart, lab work & pertinent test results  Airway Mallampati: III  TM Distance: >3 FB Neck ROM: Full    Dental no notable dental hx. (+) Teeth Intact, Dental Advisory Given   Pulmonary neg pulmonary ROS   Pulmonary exam normal breath sounds clear to auscultation       Cardiovascular negative cardio ROS  Rhythm:Regular Rate:Normal     Neuro/Psych negative neurological ROS  negative psych ROS   GI/Hepatic negative GI ROS,,,(+)     substance abuse  alcohol use  Endo/Other  negative endocrine ROS    Renal/GU negative Renal ROS  negative genitourinary   Musculoskeletal   Abdominal   Peds  Hematology negative hematology ROS (+)   Anesthesia Other Findings   Reproductive/Obstetrics negative OB ROS                              Anesthesia Physical Anesthesia Plan  ASA: 2  Anesthesia Plan: General   Post-op Pain Management: Minimal or no pain anticipated   Induction: Intravenous  PONV Risk Score and Plan: 3 and Ondansetron , Dexamethasone  and Midazolam   Airway Management Planned: Oral ETT  Additional Equipment:   Intra-op Plan:   Post-operative Plan: Extubation in OR  Informed Consent: I have reviewed the patients History and Physical, chart, labs and discussed the procedure including the risks, benefits and alternatives for the proposed anesthesia with the patient or authorized representative who has indicated his/her understanding and acceptance.     Dental advisory given  Plan Discussed with: CRNA  Anesthesia Plan Comments:          Anesthesia Quick Evaluation

## 2024-05-22 NOTE — ED Notes (Signed)
RT called for sedation

## 2024-05-22 NOTE — ED Triage Notes (Signed)
 Incident happened 2 weeks ago.

## 2024-05-22 NOTE — ED Notes (Signed)
 Consent for procedural sedation signed by patient and witnessed by this RN

## 2024-05-22 NOTE — ED Notes (Signed)
Report given to Koleen Nimrod, RN in short stay.

## 2024-05-22 NOTE — ED Provider Notes (Signed)
 [facility]    CSN: [id number] Arrival date & time: 05/22/24  0841      History   Chief Complaint Chief Complaint  Patient presents with   Shoulder Injury    HPI Devin Jones is a 58 y.o. male.   58 year old male who presents urgent care with complaints of right shoulder pain.  He reports that 2 weeks ago he fell falling with his arm underneath him directly onto his right shoulder.  Since that time he has had difficulty moving his shoulder without having to use his other arm and has been having a lot of pain.  He has injured the shoulder in the past but was not evaluated.  He reports that he is unable to do much with his arm but does still have motion in his elbow and hand.  He is right-handed.   Shoulder Injury Pertinent negatives include no chest pain, no abdominal pain and no shortness of breath.    Past Medical History:  Diagnosis Date   Alcohol abuse     Patient Active Problem List   Diagnosis Date Noted   Rupture of anterior cruciate ligament of right knee    Rupture of posterior cruciate ligament of right knee    Complex tear of lateral meniscus of left knee as current injury    Injury of posterolateral corner of knee, left, subsequent encounter    Tear of LCL (lateral collateral ligament) of knee, left, subsequent encounter    Retained orthopedic hardware    Hyperglycemia 10/02/2021   Hypoalbuminemia 10/02/2021   Elevated alkaline phosphatase level 10/02/2021   S/P ACL reconstruction 09/28/2021   Right knee dislocation, subsequent encounter    TBI (traumatic brain injury) ([facility]) 09/07/2021   Multiple trauma 09/07/2021   Pelvic fracture ([facility]) 08/26/2021    Past Surgical History:  Procedure Laterality Date   ANTERIOR CRUCIATE LIGAMENT REPAIR Right 09/28/2021   Procedure: ANTERIOR CRUCIATE LIGAMENT (ACL) & POSTERIOR CRUCIATE LIGAMENT (PCL) REPAIR;  Surgeon: Addie Cordella Hamilton, MD;  [facility];  Service: Orthopedics;  Laterality: Right;    COSMETIC SURGERY     HARDWARE REMOVAL Right 09/28/2021   Procedure: HARDWARE REMOVAL;  Surgeon: Addie Cordella Hamilton, MD;  Location: [facility];  Service: Orthopedics;  Laterality: Right;   MEDIAL COLLATERAL LIGAMENT REPAIR, KNEE Right 08/31/2021   Procedure: RIGHT KNEE MEDIAL COLLATERAL LIGAMENT REPAIR/RECONSTRUCTION (ALL OPEN);  Surgeon: Addie Cordella Hamilton, MD;  Location: [facility] OR;  Service: Orthopedics;  Laterality: Right;   ORIF RADIAL FRACTURE Right 08/28/2021   Procedure: OPEN REDUCTION INTERNAL FIXATION (ORIF) RADIAL FRACTURE;  Surgeon: Celena Sharper, MD;  [facility];  Service: Orthopedics;  Laterality: Right;   SACRO-ILIAC PINNING Right 08/28/2021   Procedure: SACRO-ILIAC SCREW FIXATION WITH  ANTERIOR PELVIC REPAIR;  Surgeon: Celena Sharper, MD;  Location[facility];  Service: Orthopedics;  Laterality: Right;       Home Medications    Prior to Admission medications   Not on File    Family History Family History  Problem Relation Age of Onset   Kidney disease Mother    Cancer Father    Hypertension Father    Heart disease Father    Liver cancer Father    Cancer - Other Father    Stroke Brother     Social History Social History   Tobacco Use   Smoking status: Never   Smokeless tobacco: Never  Vaping Use   Vaping status: Never Used  Substance Use Topics   Alcohol use: Yes  Alcohol/week: 19.0 standard drinks of alcohol    Types: 16 Cans of beer, 3 Shots of liquor per week   Drug use: Never     Allergies   Patient has no known allergies.   Review of Systems Review of Systems  Constitutional:  Negative for chills and fever.  HENT:  Negative for ear pain and sore throat.   Eyes:  Negative for pain and visual disturbance.  Respiratory:  Negative for cough and shortness of breath.   Cardiovascular:  Negative for chest pain and palpitations.  Gastrointestinal:  Negative for abdominal pain and vomiting.  Genitourinary:  Negative for dysuria and hematuria.   Musculoskeletal:  Negative for arthralgias and back pain.       Right shoulder pain and decreased range of motion  Skin:  Negative for color change and rash.  Neurological:  Negative for seizures and syncope.  All other systems reviewed and are negative.    Physical Exam Triage Vital Signs ED Triage Vitals  Encounter Vitals Group     BP 05/22/24 0940 136/82     Girls Systolic BP Percentile --      Girls Diastolic BP Percentile --      Boys Systolic BP Percentile --      Boys Diastolic BP Percentile --      Pulse Rate 05/22/24 0940 75     Resp 05/22/24 0940 18     Temp 05/22/24 0940 97.8 F (36.6 C)     Temp Source 05/22/24 0940 Oral     SpO2 05/22/24 0940 98 %     Weight --      Height --      Head Circumference --      Peak Flow --      Pain Score 05/22/24 0941 0     Pain Loc --      Pain Education --      Exclude from Growth Chart --    No data found.  Updated Vital Signs BP 136/82 (BP Location: Left Arm)   Pulse 75   Temp 97.8 F (36.6 C) (Oral)   Resp 18   SpO2 98%   Visual Acuity Right Eye Distance:   Left Eye Distance:   Bilateral Distance:    Right Eye Near:   Left Eye Near:    Bilateral Near:     Physical Exam Vitals and nursing note reviewed.  Constitutional:      General: He is not in acute distress.    Appearance: He is well-developed.  HENT:     Head: Normocephalic and atraumatic.  Eyes:     Conjunctiva/sclera: Conjunctivae normal.  Cardiovascular:     Rate and Rhythm: Normal rate and regular rhythm.     Heart sounds: No murmur heard. Pulmonary:     Effort: Pulmonary effort is normal. No respiratory distress.     Breath sounds: Normal breath sounds.  Abdominal:     Palpations: Abdomen is soft.     Tenderness: There is no abdominal tenderness.  Musculoskeletal:        General: No swelling.     Right shoulder: Deformity, tenderness and bony tenderness present. No swelling. Decreased range of motion. Normal strength (No decreased grip  strength). Normal pulse.       Arms:     Cervical back: Neck supple.  Skin:    General: Skin is warm and dry.     Capillary Refill: Capillary refill takes less than 2 seconds.  Neurological:  Mental Status: He is alert.  Psychiatric:        Mood and Affect: Mood normal.      UC Treatments / Results  Labs (all labs ordered are listed, but only abnormal results are displayed) Labs Reviewed - No data to display  EKG   Radiology DG Humerus Right Result Date: 05/22/2024 CLINICAL DATA:  Right shoulder and upper arm pain status post fall 2 weeks ago EXAM: RIGHT HUMERUS - 2 VIEW; RIGHT SHOULDER - 3 VIEW COMPARISON:  None Available. FINDINGS: Anterior inferior dislocation of the right humeral head relative to the glenoid. No acute displaced fracture fragments. Questionable cortical angulation of the right posterior eleventh rib. Partially imaged surgical hardware involving the mid radial diaphysis appears intact. IMPRESSION: 1. Anterior right shoulder dislocation. No acute displaced fracture fragments. 2. Questionable cortical angulation of the right posterior eleventh rib may represent a nondisplaced fracture. Recommend correlation with point tenderness. These results will be called to the ordering clinician or representative by the Radiologist Assistant, and communication documented in the PACS or [employer]. Electronically Signed   By: Limin  Xu M.D.   On: 05/22/2024 10:51   DG Shoulder Right Result Date: 05/22/2024 CLINICAL DATA:  Right shoulder and upper arm pain status post fall 2 weeks ago EXAM: RIGHT HUMERUS - 2 VIEW; RIGHT SHOULDER - 3 VIEW COMPARISON:  None Available. FINDINGS: Anterior inferior dislocation of the right humeral head relative to the glenoid. No acute displaced fracture fragments. Questionable cortical angulation of the right posterior eleventh rib. Partially imaged surgical hardware involving the mid radial diaphysis appears intact. IMPRESSION: 1. Anterior right  shoulder dislocation. No acute displaced fracture fragments. 2. Questionable cortical angulation of the right posterior eleventh rib may represent a nondisplaced fracture. Recommend correlation with point tenderness. These results will be called to the ordering clinician or representative by the Radiologist Assistant, and communication documented in the PACS or [employer]. Electronically Signed   By: Limin  Xu M.D.   On: 05/22/2024 10:51    Procedures Procedures (including critical care time)  Medications Ordered in UC Medications - No data to display  Initial Impression / Assessment and Plan / UC Course  I have reviewed the triage vital signs and the nursing notes.  Pertinent labs & imaging results that were available during my care of the patient were reviewed by me and considered in my medical decision making (see chart for details).     Acute pain of right shoulder - Plan: DG Shoulder Right, DG Shoulder Right  Pain in right upper arm - Plan: DG Humerus Right, DG Humerus Right  Anterior dislocation of right shoulder, initial encounter   X-ray of the shoulder and humerus done today.  No abnormality seen in the humerus however the right shoulder is dislocated.  As this has been 2 weeks and the shoulder is dislocated, reduction still need to be attempted.  We will send to the emergency room where more advanced intervention can be performed.  Reassuringly the arm is neurovascularly intact.  Final Clinical Impressions(s) / UC Diagnoses   Final diagnoses:  Acute pain of right shoulder  Pain in right upper arm  Anterior dislocation of right shoulder, initial encounter     Discharge Instructions      X-ray of the shoulder and humerus done today.  No abnormality seen in the humerus however the right shoulder is dislocated.  As this has been 2 weeks and the shoulder is dislocated, reduction still need to be attempted.  We will  send to the emergency room where more advanced  intervention can be performed.  Reassuringly the arm is neurovascularly intact.     ED Prescriptions   None    PDMP not reviewed this encounter.   Teresa Almarie LABOR, [place] 05/22/24 1106

## 2024-05-22 NOTE — ED Triage Notes (Signed)
 Patient was sent from [facility] , states he was walking on a sidewalk and fell landing right upper arm,

## 2024-05-22 NOTE — Op Note (Deleted)
   The note originally documented on this encounter has been moved the the encounter in which it belongs.

## 2024-05-22 NOTE — ED Notes (Signed)
 ED pharmacist called by RN

## 2024-05-22 NOTE — Anesthesia Procedure Notes (Signed)
 Procedure Name: Intubation Date/Time: 05/22/2024 5:31 PM  Performed by: Mataio Mele J, CRNAPre-anesthesia Checklist: Patient identified, Emergency Drugs available, Suction available and Patient being monitored Patient Re-evaluated:Patient Re-evaluated prior to induction Oxygen Delivery Method: Circle System Utilized Preoxygenation: Pre-oxygenation with 100% oxygen Induction Type: IV induction Ventilation: Mask ventilation without difficulty Laryngoscope Size: Miller and 3 Grade View: Grade I Tube type: Oral Tube size: 7.5 mm Number of attempts: 1 Airway Equipment and Method: Stylet and Oral airway Placement Confirmation: ETT inserted through vocal cords under direct vision, positive ETCO2 and breath sounds checked- equal and bilateral Secured at: 23 cm Tube secured with: Tape Dental Injury: Teeth and Oropharynx as per pre-operative assessment

## 2024-05-22 NOTE — Transfer of Care (Signed)
 Immediate Anesthesia Transfer of Care Note  Patient: Devin Jones  Procedure(s) Performed: MANIPULATION, JOINT, SHOULDER, WITH ANESTHESIA (Right: Shoulder)  Patient Location: PACU  Anesthesia Type:General  Level of Consciousness: awake, alert , and oriented  Airway & Oxygen Therapy: Patient Spontanous Breathing and Patient connected to face mask oxygen  Post-op Assessment: Report given to RN and Post -op Vital signs reviewed and stable  Post vital signs: Reviewed and stable  Last Vitals:  Vitals Value Taken Time  BP 133/92 05/22/24 18:03  Temp    Pulse 96 05/22/24 18:08  Resp 15 05/22/24 18:08  SpO2 98 % 05/22/24 18:08  Vitals shown include unfiled device data.  Last Pain:  Vitals:   05/22/24 1638  TempSrc: Oral  PainSc: 2          Complications: No notable events documented.

## 2024-05-22 NOTE — Consult Note (Signed)
 Reason for Consult:Right shoulder dislocation Referring Physician: Thom Fetters Time called: 1451 Time at bedside: [address] Devin Jones is an 58 y.o. male.  HPI: Devin Jones fell and dislocated his right shoulder 2 weeks ago. He thought he tore a muscle and so did not seek care. Now that the swelling has subsided he noticed that it looked deformed and he went to [facility] for evaluation. X-rays showed a dislocation and he was sent to the ED. Reduction under conscious sedation failed and orthopedic surgery was consulted. He is RHD and between jobs.  Past Medical History:  Diagnosis Date   Alcohol abuse     Past Surgical History:  Procedure Laterality Date   ANTERIOR CRUCIATE LIGAMENT REPAIR Right 09/28/2021   Procedure: ANTERIOR CRUCIATE LIGAMENT (ACL) & POSTERIOR CRUCIATE LIGAMENT (PCL) REPAIR;  Surgeon: Addie Cordella Hamilton, MD;  Location: [facility] OR;  Service: Orthopedics;  Laterality: Right;   COSMETIC SURGERY     HARDWARE REMOVAL Right 09/28/2021   Procedure: HARDWARE REMOVAL;  Surgeon: Addie Cordella Hamilton, MD;  [facility];  Service: Orthopedics;  Laterality: Right;   MEDIAL COLLATERAL LIGAMENT REPAIR, KNEE Right 08/31/2021   Procedure: RIGHT KNEE MEDIAL COLLATERAL LIGAMENT REPAIR/RECONSTRUCTION (ALL OPEN);  Surgeon: Addie Cordella Hamilton, MD;  Location: [facility] OR;  Service: Orthopedics;  Laterality: Right;   ORIF RADIAL FRACTURE Right 08/28/2021   Procedure: OPEN REDUCTION INTERNAL FIXATION (ORIF) RADIAL FRACTURE;  Surgeon: Celena Sharper, MD;  Location: [facility] OR;  Service: Orthopedics;  Laterality: Right;   SACRO-ILIAC PINNING Right 08/28/2021   Procedure: SACRO-ILIAC SCREW FIXATION WITH  ANTERIOR PELVIC REPAIR;  Surgeon: Celena Sharper, MD;  Location: [facility] OR;  Service: Orthopedics;  Laterality: Right;    Family History  Problem Relation Age of Onset   Kidney disease Mother    Cancer Father    Hypertension Father    Heart disease Father    Liver cancer Father    Cancer - Other Father    Stroke  Brother     Social History:  reports that he has never smoked. He has never used smokeless tobacco. He reports current alcohol use of about 19.0 standard drinks of alcohol per week. He reports that he does not use drugs.  Allergies: No Known Allergies  Medications: I have reviewed the patient's current medications.  No results found for this or any previous visit (from the past 48 hours).  DG Shoulder 1V Right Result Date: 05/22/2024 CLINICAL DATA:  Status post reduction attempt for shoulder dislocation EXAM: RIGHT SHOULDER - 1 VIEW COMPARISON:  06/11/2024 at 1:55 p.m. FINDINGS: The humeral head remains anteriorly dislocated with respect to the glenoid. Reduction unsuccessful at the time of imaging. Otherwise unremarkable. IMPRESSION: 1. Persistent anterior dislocation of the humeral head with respect to the glenoid. Electronically Signed   By: Ryan Salvage M.D.   On: 05/22/2024 15:07   DG Shoulder 1 View Right Result Date: 05/22/2024 CLINICAL DATA:  Right shoulder reduction EXAM: RIGHT SHOULDER - 1 VIEW COMPARISON:  05/22/2024 at 10:35 a.m. FINDINGS: Continued anterior inferior dislocation of the right humeral head with respect to the glenoid indicating on successful reduction. IMPRESSION: 1. Continued anterior inferior dislocation of the right humeral head with respect to the glenoid. Electronically Signed   By: Ryan Salvage M.D.   On: 05/22/2024 14:14   DG Humerus Right Result Date: 05/22/2024 CLINICAL DATA:  Right shoulder and upper arm pain status post fall 2 weeks ago EXAM: RIGHT HUMERUS - 2 VIEW; RIGHT SHOULDER - 3 VIEW COMPARISON:  None Available. FINDINGS: Anterior inferior dislocation of the right humeral head relative to the glenoid. No acute displaced fracture fragments. Questionable cortical angulation of the right posterior eleventh rib. Partially imaged surgical hardware involving the mid radial diaphysis appears intact. IMPRESSION: 1. Anterior right shoulder dislocation. No  acute displaced fracture fragments. 2. Questionable cortical angulation of the right posterior eleventh rib may represent a nondisplaced fracture. Recommend correlation with point tenderness. These results will be called to the ordering clinician or representative by the Radiologist Assistant, and communication documented in the PACS or [employer]. Electronically Signed   By: Limin  Xu M.D.   On: 05/22/2024 10:51   DG Shoulder Right Result Date: 05/22/2024 CLINICAL DATA:  Right shoulder and upper arm pain status post fall 2 weeks ago EXAM: RIGHT HUMERUS - 2 VIEW; RIGHT SHOULDER - 3 VIEW COMPARISON:  None Available. FINDINGS: Anterior inferior dislocation of the right humeral head relative to the glenoid. No acute displaced fracture fragments. Questionable cortical angulation of the right posterior eleventh rib. Partially imaged surgical hardware involving the mid radial diaphysis appears intact. IMPRESSION: 1. Anterior right shoulder dislocation. No acute displaced fracture fragments. 2. Questionable cortical angulation of the right posterior eleventh rib may represent a nondisplaced fracture. Recommend correlation with point tenderness. These results will be called to the ordering clinician or representative by the Radiologist Assistant, and communication documented in the PACS or [employer]. Electronically Signed   By: Limin  Xu M.D.   On: 05/22/2024 10:51    Review of Systems  HENT:  Negative for ear discharge, ear pain, hearing loss and tinnitus.   Eyes:  Negative for photophobia and pain.  Respiratory:  Negative for cough and shortness of breath.   Cardiovascular:  Negative for chest pain.  Gastrointestinal:  Negative for abdominal pain, nausea and vomiting.  Genitourinary:  Negative for dysuria, flank pain, frequency and urgency.  Musculoskeletal:  Positive for arthralgias (Right shoulder). Negative for back pain, myalgias and neck pain.  Neurological:  Negative for dizziness and  headaches.  Hematological:  Does not bruise/bleed easily.  Psychiatric/Behavioral:  The patient is not nervous/anxious.    Blood pressure (!) 142/99, pulse 84, temperature 97.6 F (36.4 C), resp. rate 12, height 5' 11 (1.803 m), weight 86.2 kg, SpO2 100%. Physical Exam Constitutional:      General: He is not in acute distress.    Appearance: He is well-developed. He is not diaphoretic.  HENT:     Head: Normocephalic and atraumatic.  Eyes:     General: No scleral icterus.       Right eye: No discharge.        Left eye: No discharge.     Conjunctiva/sclera: Conjunctivae normal.  Cardiovascular:     Rate and Rhythm: Normal rate and regular rhythm.  Pulmonary:     Effort: Pulmonary effort is normal. No respiratory distress.  Musculoskeletal:     Cervical back: Normal range of motion.     Comments: Right shoulder, elbow, wrist, digits- no skin wounds, mod TTP shoulder, ant shoulder/CW ecchymoses, no blocks to motion  Sens  Ax/R/M/U intact  Mot   Ax/ R/ PIN/ M/ AIN/ U intact  Rad 2+  Skin:    General: Skin is warm and dry.  Neurological:     Mental Status: He is alert.  Psychiatric:        Mood and Affect: Mood normal.        Behavior: Behavior normal.     Assessment/Plan: Right shoulder dislocation --  Plan closed reduction in OR with Dr. Addie. Please keep NPO. Anticipate discharge after surgery.    Ozell DOROTHA Ned, PA-C Orthopedic Surgery [phone] 05/22/2024, 3:33 PM

## 2024-05-22 NOTE — Brief Op Note (Signed)
   05/22/2024  6:02 PM  PATIENT:  Devin Jones  58 y.o. male  PRE-OPERATIVE DIAGNOSIS:  RIGHT SHOULDER DISLOCATION  POST-OPERATIVE DIAGNOSIS:  RIGHT SHOULDER DISLOCATION  PROCEDURE:  Procedure(s): MANIPULATION, JOINT, SHOULDER, WITH ANESTHESIA  SURGEON:  Surgeon(s): Addie, Cordella Hamilton, MD  ASSISTANT: none  ANESTHESIA:   general  EBL: 0 ml    No intake/output data recorded.  BLOOD ADMINISTERED: none  DRAINS: none   LOCAL MEDICATIONS USED:  none  SPECIMEN:  No Specimen  COUNTS:  YES  TOURNIQUET:  * No tourniquets in log *  DICTATION: .Other Dictation: Dictation Number [id number]  PLAN OF CARE: Discharge to home after PACU  PATIENT DISPOSITION:  PACU - hemodynamically stable
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
## 2024-05-22 NOTE — Op Note (Signed)
 [name], ROMULUS MEDICAL RECORD NO: [id number] ACCOUNT NO: [id number] DATE OF BIRTH: [birth year] FACILITY: [facility] LOCATION: [facility] PHYSICIAN: Cordella RAMAN. Addie, MD  Operative Report   DATE OF PROCEDURE: 05/22/2024  PREOPERATIVE DIAGNOSIS:  Right shoulder dislocation.  POSTOPERATIVE DIAGNOSIS:  Right shoulder dislocation.  PROCEDURE:  Right shoulder closed reduction under general anesthesia.  SURGEON:  Cordella RAMAN. Addie, MD  ASSISTANT:  None.  INDICATIONS:  The patient is a 58 year old patient who has had his shoulder out for 2 weeks.  He presents for operative management after explanation of risks and benefits.  DESCRIPTION OF PROCEDURE:  The patient was brought to the operating room where general anesthetic was induced.  Preoperative antibiotics were not administered.  After calling timeout, using a combination of traction and countertraction and rotation, the  right shoulder was reduced.  Difficult to reduce.  Required lateral traction force with rotation in order to get the head out from underneath the glenoid.  AP and lateral views did show a reduction which was stable with external rotation up to about 35  degrees.  The patient was placed in a shoulder immobilizer.  Shoulder area was numbed up with Marcaine , morphine , and clonidine .  The patient was transferred to the recovery room in stable condition.  We will get MRI scan on the shoulder and then proceed  with fixation of damaged tissues from there.   VAI D: 05/22/2024 6:04:40 pm T: 05/22/2024 11:22:00 pm  JOB: [id number]/ [id number]

## 2024-05-22 NOTE — ED Triage Notes (Signed)
 Pt states fell on his rt side 2wks ago on the sidewalk. C/o rt shoulder pain during certain positions. States taken advil and tylenol  with relief. States decrease movement.

## 2024-05-24 NOTE — Anesthesia Postprocedure Evaluation (Signed)
 Anesthesia Post Note  Patient: Devin Jones  Procedure(s) Performed: MANIPULATION, JOINT, SHOULDER, WITH ANESTHESIA (Right: Shoulder)     Patient location during evaluation: PACU Anesthesia Type: General Level of consciousness: awake and alert Pain management: pain level controlled Vital Signs Assessment: post-procedure vital signs reviewed and stable Respiratory status: spontaneous breathing, nonlabored ventilation and respiratory function stable Cardiovascular status: blood pressure returned to baseline and stable Postop Assessment: no apparent nausea or vomiting Anesthetic complications: no   No notable events documented.  Last Vitals:  Vitals:   05/22/24 1830 05/22/24 1845  BP: (!) 132/95 (!) 134/91  Pulse: 88 88  Resp: 12 15  Temp:  36.4 C  SpO2: 99% 100%    Last Pain:  Vitals:   05/22/24 1845  TempSrc:   PainSc: 0-No pain                 Boris Engelmann,W. EDMOND

## 2024-05-25 ENCOUNTER — Encounter (HOSPITAL_COMMUNITY): Payer: Self-pay | Admitting: Orthopedic Surgery

## 2024-06-05 ENCOUNTER — Encounter: Payer: Self-pay | Admitting: Surgical

## 2024-06-05 ENCOUNTER — Ambulatory Visit: Admitting: Surgical

## 2024-06-05 ENCOUNTER — Other Ambulatory Visit (INDEPENDENT_AMBULATORY_CARE_PROVIDER_SITE_OTHER): Payer: Self-pay

## 2024-06-05 DIAGNOSIS — M25511 Pain in right shoulder: Secondary | ICD-10-CM

## 2024-06-05 NOTE — Progress Notes (Signed)
 Post-Op Visit Note   Patient: Devin Jones           Date of Birth: 1966-06-11           MRN: [id number] Visit Date: 06/05/2024 PCP: Theotis Haze ORN, NP   Assessment & Plan:  Chief Complaint:  Chief Complaint  Patient presents with   Right Shoulder - Routine Post Op    05/22/2024 manipulation right shoulder joint under anesthesia   Visit Diagnoses:  1. Acute pain of right shoulder     Plan: Patient is a 58 year old male who presents s/p right glenohumeral reduction under anesthesia on 05/22/2024.  Patient states he really has no real pain.  He feels like he really cannot lift his arm.  He had a fall where he fell landing with his shoulder underneath him and initially thought that he strained a muscle after the fall.  He was not able to lift his arm at all immediately after the fall.  Eventually he noticed a deformity after the swelling reduced and felt like his shoulder was out of place the whole time.  He was seen at the ER with failure to reduce the shoulder in the ER.  Had to have close reduction in the operating room with Dr. Addie.  Has continued with the sling since that.  Now he is here today for follow-up.  He is right-hand dominant.  Currently unemployed.  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  On exam, patient has external rotation and abduction weakness as well as subscap weakness.  He really cannot lift his arm actively at all at the shoulder.  Intact EPL, FPL, finger abduction, pronation/supination, bicep, tricep.  Able to shrug his shoulder equal to the contralateral side.  What appears to be [facility] joint separation is present on the left but not the right.  Decreased pinprick sensation over the posterolateral deltoid region.  Axillary nerve function is questionable with really no deltoid contraction felt except for a small amount of the anterior deltoid.  No significant deformity today is noted aside from sulcus sign that is evident on inspection.  2+ radial pulse  of the right upper extremity.  Plan at this time is MRI arthrogram for further evaluation of rotator cuff pathology.  Also needs nerve conduction study for baseline evaluation of axillary nerve injury for comparison in the future if necessary.  Fenix had what sounds like dislocation event where his shoulder was dislocated anteriorly for greater than 1 week straight, increasing his risk for neurovascular injury.  Follow-up after studies to review results with Dr. Addie.  Follow-Up Instructions: No follow-ups on file.   Orders:  Orders Placed This Encounter  Procedures   XR Shoulder Right   MR SHOULDER RIGHT W CONTRAST   DL FLUORO GUIDED NEEDLE PLC ASPIRATION / INJECTTION/LOC   Ambulatory referral to Physical Medicine Rehab   No orders of the defined types were placed in this encounter.   Imaging: No results found.  PMFS History: Patient Active Problem List   Diagnosis Date Noted   Dislocation of right shoulder joint 05/22/2024   Rupture of anterior cruciate ligament of right knee    Rupture of posterior cruciate ligament of right knee    Complex tear of lateral meniscus of left knee as current injury    Injury of posterolateral corner of knee, left, subsequent encounter    Tear of LCL (lateral collateral ligament) of knee, left, subsequent encounter    Retained orthopedic hardware  Hyperglycemia 10/02/2021   Hypoalbuminemia 10/02/2021   Elevated alkaline phosphatase level 10/02/2021   S/P ACL reconstruction 09/28/2021   Right knee dislocation, subsequent encounter    TBI (traumatic brain injury) (HCC) 09/07/2021   Multiple trauma 09/07/2021   Pelvic fracture (HCC) 08/26/2021   Past Medical History:  Diagnosis Date   Alcohol abuse    Medical history non-contributory     Family History  Problem Relation Age of Onset   Kidney disease Mother    Cancer Father    Hypertension Father    Heart disease Father    Liver cancer Father    Cancer - Other Father    Stroke Brother      Past Surgical History:  Procedure Laterality Date   ANTERIOR CRUCIATE LIGAMENT REPAIR Right 09/28/2021   Procedure: ANTERIOR CRUCIATE LIGAMENT (ACL) & POSTERIOR CRUCIATE LIGAMENT (PCL) REPAIR;  Surgeon: Addie Cordella Hamilton, MD;  Location: [facility] OR;  Service: Orthopedics;  Laterality: Right;   COSMETIC SURGERY     HARDWARE REMOVAL Right 09/28/2021   Procedure: HARDWARE REMOVAL;  Surgeon: Addie Cordella Hamilton, MD;  Location[facility];  Service: Orthopedics;  Laterality: Right;   MEDIAL COLLATERAL LIGAMENT REPAIR, KNEE Right 08/31/2021   Procedure: RIGHT KNEE MEDIAL COLLATERAL LIGAMENT REPAIR/RECONSTRUCTION (ALL OPEN);  Surgeon: Addie Cordella Hamilton, MD;  Location: [facility] OR;  Service: Orthopedics;  Laterality: Right;   ORIF RADIAL FRACTURE Right 08/28/2021   Procedure: OPEN REDUCTION INTERNAL FIXATION (ORIF) RADIAL FRACTURE;  Surgeon: Celena Sharper, MD;  Location: [facility] OR;  Service: Orthopedics;  Laterality: Right;   SACRO-ILIAC PINNING Right 08/28/2021   Procedure: SACRO-ILIAC SCREW FIXATION WITH  ANTERIOR PELVIC REPAIR;  Surgeon: Celena Sharper, MD;  Location: [facility] OR;  Service: Orthopedics;  Laterality: Right;   SHOULDER CLOSED REDUCTION Right 05/22/2024   Procedure: MANIPULATION, JOINT, SHOULDER, WITH ANESTHESIA;  Surgeon: Addie Cordella Hamilton, MD;  Location: [facility] OR;  Service: Orthopedics;  Laterality: Right;   Social History   Occupational History   Occupation: Event organiser: [employer]  Tobacco Use   Smoking status: Never   Smokeless tobacco: Never  Vaping Use   Vaping status: Never Used  Substance and Sexual Activity   Alcohol use: Yes    Alcohol/week: 19.0 standard drinks of alcohol    Types: 16 Cans of beer, 3 Shots of liquor per week   Drug use: Never   Sexual activity: Not on file

## 2024-06-17 ENCOUNTER — Encounter: Payer: Self-pay | Admitting: Surgical

## 2024-06-22 ENCOUNTER — Encounter: Payer: Self-pay | Admitting: Surgical

## 2024-06-23 ENCOUNTER — Encounter: Payer: Self-pay | Admitting: Surgical

## 2024-06-24 ENCOUNTER — Telehealth: Payer: Self-pay | Admitting: *Deleted

## 2024-06-24 NOTE — Telephone Encounter (Signed)
 Received call from pt and talked to him and he stated his insurance is OON with [facility] and he had contacted his insurance and they advised him [facility] was in network. I asked if they had mentioned any [facility] since [facility] and he stated that was the only place within 20 miles.   I told pt I wanted to look into this further to see if I could try to do a free standing facility vs a hospital setting. Pt was ok with me looking furter into this.

## 2024-06-25 ENCOUNTER — Other Ambulatory Visit

## 2024-06-29 NOTE — Progress Notes (Signed)
 Wise Fees - 58 y.o. male MRN [id number]  Date of birth: September 26, 1965  Office Visit Note: Visit Date: 06/30/2024 PCP: Theotis Haze ORN, NP Referred by: Shirly Carlin CROME, PA-C  Subjective: Chief Complaint  Patient presents with   Right Shoulder - Numbness, Weakness   HPI: Devin Jones is a 58 y.o. male who comes in today at the request of Herlene Shirly, PA-C for evaluation and management of chronic, worsening and weakness, numbness and tingling in the Right upper extremities.  Patient is Right hand dominant.  He reports a fall in early September while out at night with friends.  In he landed with his shoulder and arm underneath him and felt like he had just strained a muscle.  Ultimately was seen in the emergency room and they could not reduce the shoulder as he had a dislocation and had felt the deformity.  He ultimately underwent glenohumeral reduction under anesthesia on 05/22/2024 by Dr. Addie.  Since that time he has had continued weakness with abduction and forward flexion of the right arm.  He can shrug actively.  He can flex and extend his elbow.  He has no real numbness or tingling in the distal arms bilaterally.  No frank radicular type symptoms down the arms.  Does have a patch of paresthesia around the deltoid on the right.  He does not really endorse a lot of neck pain or pain really of the shoulder in general.  Biggest complaint is inability to use the arm.   I spent more than 30 minutes speaking face-to-face with the patient with 50% of the time in counseling and discussing coordination of care.        Review of Systems  Musculoskeletal:  Positive for joint pain.  Neurological:  Positive for tingling and focal weakness.  All other systems reviewed and are negative.  Otherwise per HPI.  Assessment & Plan: Visit Diagnoses:    ICD-10-CM   1. Paresthesia of skin  R20.2 NCV with EMG (electromyography)    2. Weakness of right shoulder  R29.898     3. 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  Could be cervical radiculopathy or plexopathy.  Electrodiagnostic study performed today.  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 The lesion is characterized by sensory and motor demyelination with evidence of significant axonal injury.  **The absence of motor unit action potentials portends a worse outcome for recovery.  There is no significant electrodiagnostic evidence of any other focal nerve entrapment, brachial plexopathy or cervical radiculopathy.   Recommendations: 1.  Follow-up with referring physician.  Continue physical therapy and consider repeat electromyography depending on amount of recovery. 2.  Continue current management of symptoms.  Meds & Orders: No orders of the defined types were placed in this encounter.   Orders Placed This Encounter  Procedures   NCV with EMG (electromyography)    Follow-up: Return for [employer], PA-C.   Procedures: No procedures performed  EMG & NCV Findings: Evaluation of the right axillary motor nerve showed prolonged distal onset latency (6.4 ms).  All remaining nerves (as indicated in the following tables) were within normal limits.    Needle evaluation of the right deltoid muscle showed increased insertional activity, widespread spontaneous activity, increased motor unit amplitude, and diminished recruitment.  The right Ext Digitorum muscle showed increased insertional activity and moderately increased spontaneous activity.  All remaining muscles (as indicated in the following table) showed no evidence of electrical instability.    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 The  lesion is characterized by sensory and motor demyelination with evidence of significant axonal injury.  **The absence of motor unit action potentials portends a worse outcome for recovery.  There is no significant electrodiagnostic evidence of any other focal nerve entrapment, brachial plexopathy or cervical radiculopathy.   Recommendations: 1.  Follow-up with referring physician.  Continue physical therapy and consider repeat electromyography depending on amount of recovery. 2.  Continue current management of symptoms.  ___________________________ Prentice Masters FAAPMR Board Certified, American Board of Physical Medicine and Rehabilitation    Nerve Conduction Studies Anti Sensory Summary Table   Stim Site NR Peak (ms) Norm Peak (ms) P-T Amp (V) Norm P-T Amp Site1 Site2 Delta-P (ms) Dist (cm) Vel (m/s) Norm Vel (m/s)  Right Median Acr Palm Anti Sensory (2nd Digit)  30.5C  Wrist    3.2 <3.6 51.1 >10 Wrist Palm 1.4 0.0    Palm    1.8 <2.0 56.5         Right Radial Anti Sensory (Base 1st Digit)  29.5C  Wrist    2.1 <3.1 38.2  Wrist Base 1st Digit 2.1 0.0    Right Ulnar Anti Sensory (5th Digit)  30C  Wrist    3.4 <3.7 25.2 >15.0 Wrist 5th Digit 3.4 14.0 41 >38   Motor Summary Table   Stim Site NR Onset (ms) Norm Onset (ms) O-P Amp (mV) Norm O-P Amp Site1 Site2 Delta-0 (ms) Dist (cm) Vel (m/s) Norm Vel (m/s)  Left Axillary Motor (Deltoid)  28.1C  Clavicle    4.1 <5 1.9  Clavicle Deltoid 4.1 20.0 49   Right Axillary Motor (Deltoid)  28.5C  Clavicle    *6.4 <5 0.4  Clavicle Deltoid 6.4 20.0 31   Right Median Motor (Abd Poll Brev)  29.6C  Wrist    3.3 <4.2 7.6 >5 Elbow Wrist 4.1 23.0 56 >50  Elbow    7.4  7.3         Right Ulnar Motor (Abd Dig Min)  29.5C  Wrist    3.0 <4.2 11.1 >3 B Elbow Wrist 4.3 23.0 53 >53  B Elbow    7.3  10.2  A Elbow B Elbow 1.8 10.0 56 >53  A Elbow    9.1  9.9          EMG   Side Muscle Nerve Root Ins Act Fibs Psw Amp Dur Poly Recrt Int Bruna Comment   Right Abd Poll Brev Median C8-T1 Nml Nml Nml Nml Nml 0 Nml Nml   Right 1stDorInt Ulnar C8-T1 Nml Nml Nml Nml Nml 0 Nml Nml   Right Triceps Radial C6-7-8 Nml Nml Nml Nml Nml 0 Nml Nml   Right Deltoid Axillary C5-6 *Incr *4+ *4+ *Incr Nml 0 *Reduced Nml NO MUAP  Right Trapezius SpinalAcc CN XI, C3-4 Nml Nml Nml Nml Nml 0 Nml Nml   Right Supraspinatus SupraScap C5-6 Nml Nml Nml Nml Nml 0 Nml Nml   Right Ext Digitorum  Radial (Post Int) C7-8 *Incr *2+ *2+ Nml Nml 0 Nml Nml     Nerve Conduction Studies Anti Sensory Left/Right Comparison   Stim Site L Lat (ms) R Lat (ms) L-R Lat (  ms) L Amp (V) R Amp (V) L-R Amp (%) Site1 Site2 L Vel (m/s) R Vel (m/s) L-R Vel (m/s)  Median Acr Palm Anti Sensory (2nd Digit)  30.5C  Wrist  3.2   51.1  Wrist Palm     Palm  1.8   56.5        Radial Anti Sensory (Base 1st Digit)  29.5C  Wrist  2.1   38.2  Wrist Base 1st Digit     Ulnar Anti Sensory (5th Digit)  30C  Wrist  3.4   25.2  Wrist 5th Digit  41    Motor Left/Right Comparison   Stim Site L Lat (ms) R Lat (ms) L-R Lat (ms) L Amp (mV) R Amp (mV) L-R Amp (%) Site1 Site2 L Vel (m/s) R Vel (m/s) L-R Vel (m/s)  Axillary Motor (Deltoid)  28.1C  Clavicle 4.1 *6.4 2.3 1.9 0.4 78.9 Clavicle Deltoid 49 31 18  Median Motor (Abd Poll Brev)  29.6C  Wrist  3.3   7.6  Elbow Wrist  56   Elbow  7.4   7.3        Ulnar Motor (Abd Dig Min)  29.5C  Wrist  3.0   11.1  B Elbow Wrist  53   B Elbow  7.3   10.2  A Elbow B Elbow  56   A Elbow  9.1   9.9           Waveforms:                Clinical History: No specialty comments available.   He reports that he has never smoked. He has never used smokeless tobacco. No results for input(s): HGBA1C, LABURIC in the last 8760 hours.  Objective:  VS:  HT:    WT:   BMI:     BP:   HR: bpm  TEMP: ( )  RESP:  Physical Exam Vitals and nursing note reviewed.  Constitutional:      General: He is not in acute distress.    Appearance: Normal appearance. He  is well-developed.  HENT:     Head: Normocephalic and atraumatic.  Eyes:     Conjunctiva/sclera: Conjunctivae normal.     Pupils: Pupils are equal, round, and reactive to light.  Cardiovascular:     Rate and Rhythm: Normal rate.     Pulses: Normal pulses.     Heart sounds: Normal heart sounds.  Pulmonary:     Effort: Pulmonary effort is normal. No respiratory distress.  Musculoskeletal:        General: No tenderness.     Cervical back: Normal range of motion and neck supple. No rigidity.     Right lower leg: No edema.     Left lower leg: No edema.     Comments: Inspection reveals no atrophy of the bilateral APB or FDI or hand intrinsics. There is no swelling, color changes, allodynia or dystrophic changes. There is 5 out of 5 strength in the bilateral wrist extension, finger abduction and long finger flexion.  He has 0 out of 5 shoulder abduction with weak forward flexion.  Positive shrug.  5 out of 5 strength in the rest of the upper extremity on the right.  There is intact sensation to light touch in all dermatomal and peripheral nerve distributions. There is a negative Phalen's test bilaterally. There is a negative Hoffmann's test bilaterally.  Skin:    General: Skin is warm and dry.     Findings: No  erythema or rash.  Neurological:     General: No focal deficit present.     Mental Status: He is alert and oriented to person, place, and time.     Sensory: No sensory deficit.     Motor: No weakness or abnormal muscle tone.     Coordination: Coordination normal.     Gait: Gait normal.  Psychiatric:        Mood and Affect: Mood normal.        Behavior: Behavior normal.        Thought Content: Thought content normal.     Ortho Exam  Imaging: No results found.  Past Medical/Family/Surgical/Social History: Medications & Allergies reviewed per EMR, new medications updated. Patient Active Problem List   Diagnosis Date Noted   Dislocation of right shoulder joint 05/22/2024    Rupture of anterior cruciate ligament of right knee    Rupture of posterior cruciate ligament of right knee    Complex tear of lateral meniscus of left knee as current injury    Injury of posterolateral corner of knee, left, subsequent encounter    Tear of LCL (lateral collateral ligament) of knee, left, subsequent encounter    Retained orthopedic hardware    Hyperglycemia 10/02/2021   Hypoalbuminemia 10/02/2021   Elevated alkaline phosphatase level 10/02/2021   S/P ACL reconstruction 09/28/2021   Right knee dislocation, subsequent encounter    TBI (traumatic brain injury) (HCC) 09/07/2021   Multiple trauma 09/07/2021   Pelvic fracture (HCC) 08/26/2021   Past Medical History:  Diagnosis Date   Alcohol abuse    Medical history non-contributory    Family History  Problem Relation Age of Onset   Kidney disease Mother    Cancer Father    Hypertension Father    Heart disease Father    Liver cancer Father    Cancer - Other Father    Stroke Brother    Past Surgical History:  Procedure Laterality Date   ANTERIOR CRUCIATE LIGAMENT REPAIR Right 09/28/2021   Procedure: ANTERIOR CRUCIATE LIGAMENT (ACL) & POSTERIOR CRUCIATE LIGAMENT (PCL) REPAIR;  Surgeon: Addie Cordella Hamilton, MD;  Location: [facility] OR;  Service: Orthopedics;  Laterality: Right;   COSMETIC SURGERY     HARDWARE REMOVAL Right 09/28/2021   Procedure: HARDWARE REMOVAL;  Surgeon: Addie Cordella Hamilton, MD;  Location: [facility] OR;  Service: Orthopedics;  Laterality: Right;   MEDIAL COLLATERAL LIGAMENT REPAIR, KNEE Right 08/31/2021   Procedure: RIGHT KNEE MEDIAL COLLATERAL LIGAMENT REPAIR/RECONSTRUCTION (ALL OPEN);  Surgeon: Addie Cordella Hamilton, MD;  Location: [facility] OR;  Service: Orthopedics;  Laterality: Right;   ORIF RADIAL FRACTURE Right 08/28/2021   Procedure: OPEN REDUCTION INTERNAL FIXATION (ORIF) RADIAL FRACTURE;  Surgeon: Celena Sharper, MD;  Location: [facility] OR;  Service: Orthopedics;  Laterality: Right;   SACRO-ILIAC PINNING Right  08/28/2021   Procedure: SACRO-ILIAC SCREW FIXATION WITH  ANTERIOR PELVIC REPAIR;  Surgeon: Celena Sharper, MD;  Location: [facility] OR;  Service: Orthopedics;  Laterality: Right;   SHOULDER CLOSED REDUCTION Right 05/22/2024   Procedure: MANIPULATION, JOINT, SHOULDER, WITH ANESTHESIA;  Surgeon: Addie Cordella Hamilton, MD;  Location: [facility] OR;  Service: Orthopedics;  Laterality: Right;   Social History   Occupational History   Occupation: Event organiser: [employer]  Tobacco Use   Smoking status: Never   Smokeless tobacco: Never  Vaping Use   Vaping status: Never Used  Substance and Sexual Activity   Alcohol use: Yes    Alcohol/week: 19.0 standard drinks of alcohol  Types: 16 Cans of beer, 3 Shots of liquor per week   Drug use: Never   Sexual activity: Not on file

## 2024-06-30 ENCOUNTER — Ambulatory Visit: Admitting: Physical Medicine and Rehabilitation

## 2024-06-30 DIAGNOSIS — M25511 Pain in right shoulder: Secondary | ICD-10-CM

## 2024-06-30 DIAGNOSIS — R202 Paresthesia of skin: Secondary | ICD-10-CM | POA: Diagnosis not present

## 2024-06-30 DIAGNOSIS — G8929 Other chronic pain: Secondary | ICD-10-CM | POA: Diagnosis not present

## 2024-06-30 DIAGNOSIS — R29898 Other symptoms and signs involving the musculoskeletal system: Secondary | ICD-10-CM | POA: Diagnosis not present

## 2024-06-30 NOTE — Progress Notes (Signed)
 Pain Scale   Average Pain 0 Patient advising he has numbness and inability to move his right shoulder or extend his right arm. Patient is Right hand dominate        +Driver, -BT, -Dye Allergies.

## 2024-06-30 NOTE — Telephone Encounter (Signed)
 I faxed order to [facility] imaging at [phone]

## 2024-07-01 ENCOUNTER — Encounter: Payer: Self-pay | Admitting: Physical Medicine and Rehabilitation

## 2024-07-01 NOTE — Procedures (Signed)
 EMG & NCV Findings: Evaluation of the right axillary motor nerve showed prolonged distal onset latency (6.4 ms).  All remaining nerves (as indicated in the following tables) were within normal limits.    Needle evaluation of the right deltoid muscle showed increased insertional activity, widespread spontaneous activity, increased motor unit amplitude, and diminished recruitment.  The right Ext Digitorum muscle showed increased insertional activity and moderately increased spontaneous activity.  All remaining muscles (as indicated in the following table) showed no evidence of electrical instability.    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 The lesion is characterized by sensory and motor demyelination with evidence of significant axonal injury.  **The absence of motor unit action potentials portends a worse outcome for recovery.  There is no significant electrodiagnostic evidence of any other focal nerve entrapment, brachial plexopathy or cervical radiculopathy.   Recommendations: 1.  Follow-up with referring physician.  Continue physical therapy and consider repeat electromyography depending on amount of recovery. 2.  Continue current management of symptoms.  ___________________________ Prentice Masters FAAPMR Board Certified, American Board of Physical Medicine and Rehabilitation    Nerve Conduction Studies Anti Sensory Summary Table   Stim Site NR Peak (ms) Norm Peak (ms) P-T Amp (V) Norm P-T Amp Site1 Site2 Delta-P (ms) Dist (cm) Vel (m/s) Norm Vel (m/s)  Right Median Acr Palm Anti Sensory (2nd Digit)  30.5C  Wrist    3.2 <3.6 51.1 >10 Wrist Palm 1.4 0.0    Palm    1.8 <2.0 56.5         Right Radial Anti Sensory (Base 1st Digit)  29.5C  Wrist    2.1 <3.1 38.2  Wrist Base 1st Digit 2.1 0.0    Right Ulnar Anti Sensory (5th Digit)  30C  Wrist    3.4 <3.7 25.2 >15.0  Wrist 5th Digit 3.4 14.0 41 >38   Motor Summary Table   Stim Site NR Onset (ms) Norm Onset (ms) O-P Amp (mV) Norm O-P Amp Site1 Site2 Delta-0 (ms) Dist (cm) Vel (m/s) Norm Vel (m/s)  Left Axillary Motor (Deltoid)  28.1C  Clavicle    4.1 <5 1.9  Clavicle Deltoid 4.1 20.0 49   Right Axillary Motor (Deltoid)  28.5C  Clavicle    *6.4 <5 0.4  Clavicle Deltoid 6.4 20.0 31   Right Median Motor (Abd Poll Brev)  29.6C  Wrist    3.3 <4.2 7.6 >5 Elbow Wrist 4.1 23.0 56 >50  Elbow    7.4  7.3         Right Ulnar Motor (Abd Dig Min)  29.5C  Wrist    3.0 <4.2 11.1 >3 B Elbow Wrist 4.3 23.0 53 >53  B Elbow    7.3  10.2  A Elbow B Elbow 1.8 10.0 56 >53  A Elbow    9.1  9.9          EMG   Side Muscle Nerve Root Ins Act Fibs Psw Amp Dur Poly Recrt Int Bruna Comment  Right Abd Poll Brev Median C8-T1 Nml Nml Nml Nml Nml 0 Nml Nml   Right 1stDorInt Ulnar C8-T1 Nml Nml Nml Nml Nml 0 Nml Nml   Right Triceps Radial C6-7-8 Nml Nml Nml Nml Nml 0 Nml Nml   Right Deltoid Axillary C5-6 *Incr *4+ *4+ *Incr Nml 0 *Reduced Nml NO MUAP  Right Trapezius SpinalAcc CN XI, C3-4 Nml Nml Nml Nml Nml 0 Nml  Nml   Right Supraspinatus SupraScap C5-6 Nml Nml Nml Nml Nml 0 Nml Nml   Right Ext Digitorum  Radial (Post Int) C7-8 *Incr *2+ *2+ Nml Nml 0 Nml Nml     Nerve Conduction Studies Anti Sensory Left/Right Comparison   Stim Site L Lat (ms) R Lat (ms) L-R Lat (ms) L Amp (V) R Amp (V) L-R Amp (%) Site1 Site2 L Vel (m/s) R Vel (m/s) L-R Vel (m/s)  Median Acr Palm Anti Sensory (2nd Digit)  30.5C  Wrist  3.2   51.1  Wrist Palm     Palm  1.8   56.5        Radial Anti Sensory (Base 1st Digit)  29.5C  Wrist  2.1   38.2  Wrist Base 1st Digit     Ulnar Anti Sensory (5th Digit)  30C  Wrist  3.4   25.2  Wrist 5th Digit  41    Motor Left/Right Comparison   Stim Site L Lat (ms) R Lat (ms) L-R Lat (ms) L Amp (mV) R Amp (mV) L-R Amp (%) Site1 Site2 L Vel (m/s) R Vel (m/s) L-R Vel (m/s)  Axillary Motor (Deltoid)  28.1C   Clavicle 4.1 *6.4 2.3 1.9 0.4 78.9 Clavicle Deltoid 49 31 18  Median Motor (Abd Poll Brev)  29.6C  Wrist  3.3   7.6  Elbow Wrist  56   Elbow  7.4   7.3        Ulnar Motor (Abd Dig Min)  29.5C  Wrist  3.0   11.1  B Elbow Wrist  53   B Elbow  7.3   10.2  A Elbow B Elbow  56   A Elbow  9.1   9.9           Waveforms:

## 2024-07-06 ENCOUNTER — Ambulatory Visit: Admitting: Surgical

## 2024-07-20 ENCOUNTER — Encounter: Payer: Self-pay | Admitting: Radiology

## 2024-10-09 ENCOUNTER — Ambulatory Visit: Admitting: Surgical
